# Patient Record
Sex: Male | Born: 1985 | Race: Black or African American | Hispanic: No | Marital: Single
Health system: Southern US, Community
[De-identification: ages and names within clinical notes are randomized; demographics above are authoritative.]

## PROBLEM LIST (undated history)

## (undated) DIAGNOSIS — B20 Human immunodeficiency virus [HIV] disease: Secondary | ICD-10-CM

## (undated) DIAGNOSIS — W3400XA Accidental discharge from unspecified firearms or gun, initial encounter: Secondary | ICD-10-CM

## (undated) DIAGNOSIS — F23 Brief psychotic disorder: Secondary | ICD-10-CM

## (undated) HISTORY — DX: Human immunodeficiency virus (HIV) disease: B20

---
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
## 2010-11-05 ENCOUNTER — Emergency Department (HOSPITAL_COMMUNITY)
Admission: EM | Admit: 2010-11-05 | Discharge: 2010-11-05 | Disposition: A | Discharge status: Home / Self Care | Payer: Self-pay | Attending: Emergency Medicine | Admitting: Emergency Medicine

## 2010-11-05 DIAGNOSIS — R142 Eructation: Secondary | ICD-10-CM | POA: Insufficient documentation

## 2010-11-05 DIAGNOSIS — R141 Gas pain: Secondary | ICD-10-CM | POA: Insufficient documentation

## 2010-11-05 DIAGNOSIS — H538 Other visual disturbances: Secondary | ICD-10-CM | POA: Insufficient documentation

## 2010-11-05 DIAGNOSIS — R110 Nausea: Secondary | ICD-10-CM | POA: Insufficient documentation

## 2010-11-05 LAB — URINE MICROSCOPIC-ADD ON

## 2010-11-05 LAB — RAPID URINE DRUG SCREEN, HOSP PERFORMED
Amphetamines: NOT DETECTED
Barbiturates: NOT DETECTED
Benzodiazepines: NOT DETECTED
Cocaine: POSITIVE — AB
Opiates: NOT DETECTED
Tetrahydrocannabinol: POSITIVE — AB

## 2010-11-05 LAB — POCT I-STAT, CHEM 8
BUN: 14 mg/dL (ref 6–23)
Calcium, Ion: 1.16 mmol/L (ref 1.12–1.32)
Chloride: 103 meq/L (ref 96–112)
Creatinine, Ser: 1.4 mg/dL (ref 0.4–1.5)
Glucose, Bld: 97 mg/dL (ref 70–99)
HCT: 45 % (ref 39.0–52.0)
Hemoglobin: 15.3 g/dL (ref 13.0–17.0)
Potassium: 4.2 meq/L (ref 3.5–5.1)
Sodium: 140 meq/L (ref 135–145)
TCO2: 25 mmol/L (ref 0–100)

## 2010-11-05 LAB — URINALYSIS, ROUTINE W REFLEX MICROSCOPIC
Hgb urine dipstick: NEGATIVE
Nitrite: NEGATIVE
Protein, ur: NEGATIVE mg/dL
Specific Gravity, Urine: 1.032 — ABNORMAL HIGH (ref 1.005–1.030)
Urobilinogen, UA: 4 mg/dL — ABNORMAL HIGH (ref 0.0–1.0)

## 2010-11-05 LAB — GLUCOSE, CAPILLARY: Glucose-Capillary: 113 mg/dL — ABNORMAL HIGH (ref 70–99)

## 2011-02-27 ENCOUNTER — Emergency Department (HOSPITAL_COMMUNITY)
Admission: EM | Admit: 2011-02-27 | Discharge: 2011-02-27 | Disposition: A | Discharge status: Home / Self Care | Payer: Self-pay | Attending: Emergency Medicine | Admitting: Emergency Medicine

## 2011-02-27 ENCOUNTER — Emergency Department (HOSPITAL_COMMUNITY): Payer: Self-pay

## 2011-02-27 DIAGNOSIS — M25579 Pain in unspecified ankle and joints of unspecified foot: Secondary | ICD-10-CM | POA: Insufficient documentation

## 2011-02-27 DIAGNOSIS — Y92009 Unspecified place in unspecified non-institutional (private) residence as the place of occurrence of the external cause: Secondary | ICD-10-CM | POA: Insufficient documentation

## 2011-02-27 DIAGNOSIS — W010XXA Fall on same level from slipping, tripping and stumbling without subsequent striking against object, initial encounter: Secondary | ICD-10-CM | POA: Insufficient documentation

## 2011-02-27 DIAGNOSIS — J45909 Unspecified asthma, uncomplicated: Secondary | ICD-10-CM | POA: Insufficient documentation

## 2011-02-27 DIAGNOSIS — S93609A Unspecified sprain of unspecified foot, initial encounter: Secondary | ICD-10-CM | POA: Insufficient documentation

## 2011-02-27 DIAGNOSIS — M7989 Other specified soft tissue disorders: Secondary | ICD-10-CM | POA: Insufficient documentation

## 2017-01-14 ENCOUNTER — Ambulatory Visit (INDEPENDENT_AMBULATORY_CARE_PROVIDER_SITE_OTHER): Payer: Self-pay | Admitting: Infectious Diseases

## 2017-01-14 ENCOUNTER — Encounter: Payer: Self-pay | Admitting: Infectious Diseases

## 2017-01-14 VITALS — BP 117/72 | HR 60 | Temp 97.9°F | Ht 71.0 in | Wt 174.0 lb

## 2017-01-14 DIAGNOSIS — Z7189 Other specified counseling: Secondary | ICD-10-CM

## 2017-01-14 DIAGNOSIS — Z7185 Encounter for immunization safety counseling: Secondary | ICD-10-CM

## 2017-01-14 DIAGNOSIS — Z113 Encounter for screening for infections with a predominantly sexual mode of transmission: Secondary | ICD-10-CM

## 2017-01-14 DIAGNOSIS — Z21 Asymptomatic human immunodeficiency virus [HIV] infection status: Secondary | ICD-10-CM

## 2017-01-14 DIAGNOSIS — B20 Human immunodeficiency virus [HIV] disease: Secondary | ICD-10-CM

## 2017-01-14 DIAGNOSIS — Z23 Encounter for immunization: Secondary | ICD-10-CM

## 2017-01-14 HISTORY — DX: Human immunodeficiency virus (HIV) disease: B20

## 2017-01-14 HISTORY — DX: Asymptomatic human immunodeficiency virus (hiv) infection status: Z21

## 2017-01-14 LAB — COMPLETE METABOLIC PANEL WITH GFR
ALBUMIN: 4.2 g/dL (ref 3.6–5.1)
ALK PHOS: 62 U/L (ref 40–115)
ALT: 18 U/L (ref 9–46)
AST: 18 U/L (ref 10–40)
BILIRUBIN TOTAL: 0.5 mg/dL (ref 0.2–1.2)
BUN: 8 mg/dL (ref 7–25)
CO2: 25 mmol/L (ref 20–31)
Calcium: 9.2 mg/dL (ref 8.6–10.3)
Chloride: 103 mmol/L (ref 98–110)
Creat: 0.96 mg/dL (ref 0.60–1.35)
GLUCOSE: 85 mg/dL (ref 65–99)
POTASSIUM: 4.1 mmol/L (ref 3.5–5.3)
SODIUM: 135 mmol/L (ref 135–146)
Total Protein: 7.7 g/dL (ref 6.1–8.1)

## 2017-01-14 LAB — CBC WITH DIFFERENTIAL/PLATELET
BASOS ABS: 42 {cells}/uL (ref 0–200)
Basophils Relative: 1 %
EOS ABS: 168 {cells}/uL (ref 15–500)
EOS PCT: 4 %
HCT: 40 % (ref 38.5–50.0)
HEMOGLOBIN: 13.8 g/dL (ref 13.2–17.1)
LYMPHS ABS: 1470 {cells}/uL (ref 850–3900)
Lymphocytes Relative: 35 %
MCH: 33.4 pg — AB (ref 27.0–33.0)
MCHC: 34.5 g/dL (ref 32.0–36.0)
MCV: 96.9 fL (ref 80.0–100.0)
MONOS PCT: 14 %
MPV: 9.7 fL (ref 7.5–12.5)
Monocytes Absolute: 588 cells/uL (ref 200–950)
NEUTROS PCT: 46 %
Neutro Abs: 1932 cells/uL (ref 1500–7800)
Platelets: 182 10*3/uL (ref 140–400)
RBC: 4.13 MIL/uL — ABNORMAL LOW (ref 4.20–5.80)
RDW: 12.5 % (ref 11.0–15.0)
WBC: 4.2 10*3/uL (ref 3.8–10.8)

## 2017-01-14 LAB — LIPID PANEL
Cholesterol: 152 mg/dL (ref <200)
HDL: 43 mg/dL (ref >40)
LDL CALC: 86 mg/dL (ref <100)
Total CHOL/HDL Ratio: 3.5 Ratio (ref <5.0)
Triglycerides: 117 mg/dL (ref <150)
VLDL: 23 mg/dL (ref <30)

## 2017-01-14 NOTE — Patient Instructions (Signed)
Nice to meet you today.   Please think about starting medications for your condition. It will be life-saving!  Come back in 2 months so we can talk about options again, see how you are feeling and review your lab work.

## 2017-01-14 NOTE — Assessment & Plan Note (Addendum)
New patient here to establish for HIV care. I discussed with Jason Herman treatment options/side effects, benefits of treatment and long-term outcomes. I discussed how HIV is transmitted and the process of untreated HIV including increased risk for opportunistic infections, cancer, dementia and renal failure. He was counseled on routine HIV care including medication adherence, blood monitoring, necessary vaccines and follow up visits. Counseled regarding safe sex practices including: condom use, partner disclosure, limiting partners. He spent time talking with our pharmacist Cassie regarding successful practices of ART and understands to reach out to our clinic in the future with questions.   He is not interested in starting medications at this time. It seems that he believes in part that his condition was 'wished' upon him and for reasons similar to these beliefs he does not like to take medications (spoke to me about how people willed his mother seizures). I will check baseline blood work today including CBC w/ Diff, CMET, lipids, HIV VL with genotype reflex, CD4 count, HLA-B*701, Hep A Ab, HepB sAg, HepB sAb, HepC Ab, quantiferon gold, urinalysis, urine GC/C. I recommended he follow up in 2 months (when he is released - per his preference) so we can review lab work and further discuss treatment.

## 2017-01-14 NOTE — Assessment & Plan Note (Signed)
Administered Menveo and Pneumovax today in clinic. Will assess serology for hep A/B immune status.
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
## 2017-01-15 LAB — URINALYSIS
Bilirubin Urine: NEGATIVE
Glucose, UA: NEGATIVE
Hgb urine dipstick: NEGATIVE
Ketones, ur: NEGATIVE
LEUKOCYTES UA: NEGATIVE
NITRITE: NEGATIVE
Protein, ur: NEGATIVE
SPECIFIC GRAVITY, URINE: 1.009 (ref 1.001–1.035)
pH: 7 (ref 5.0–8.0)

## 2017-01-15 LAB — HEPATITIS B SURFACE ANTIBODY,QUALITATIVE: HEP B S AB: NONREACTIVE

## 2017-01-15 LAB — T-HELPER CELL (CD4) - (RCID CLINIC ONLY)
CD4 % Helper T Cell: 30 % — ABNORMAL LOW (ref 33–55)
CD4 T Cell Abs: 460 /uL (ref 400–2700)

## 2017-01-15 LAB — HEPATITIS C ANTIBODY: HCV Ab: NEGATIVE

## 2017-01-15 LAB — URINE CYTOLOGY ANCILLARY ONLY
Chlamydia: NEGATIVE
Neisseria Gonorrhea: NEGATIVE

## 2017-01-15 LAB — HEPATITIS A ANTIBODY, TOTAL: HEP A TOTAL AB: NONREACTIVE

## 2017-01-15 LAB — HEPATITIS B SURFACE ANTIGEN: HEP B S AG: NEGATIVE

## 2017-01-15 LAB — RPR

## 2017-01-15 NOTE — Progress Notes (Signed)
No OI proph indicated. CD4 460.

## 2017-01-16 LAB — QUANTIFERON TB GOLD ASSAY (BLOOD)
Interferon Gamma Release Assay: NEGATIVE
Mitogen-Nil: 9.51 IU/mL
Quantiferon Nil Value: 0.2 IU/mL
Quantiferon Tb Ag Minus Nil Value: <0 IU/mL

## 2017-01-17 ENCOUNTER — Encounter: Payer: Self-pay | Admitting: Infectious Diseases

## 2017-01-18 ENCOUNTER — Encounter: Payer: Self-pay | Admitting: Licensed Clinical Social Worker

## 2017-01-23 LAB — HIV-1 RNA,QN PCR W/REFLEX GENOTYPE
HIV-1 RNA, QN PCR: 5.21 {Log_copies}/mL — AB
HIV-1 RNA, QN PCR: 161000 Copies/mL — ABNORMAL HIGH

## 2017-01-23 LAB — HLA B*5701: HLA-B*5701 w/rflx HLA-B High: NEGATIVE

## 2017-03-14 ENCOUNTER — Ambulatory Visit: Payer: Self-pay | Admitting: Infectious Diseases

## 2020-03-23 ENCOUNTER — Emergency Department (HOSPITAL_COMMUNITY)
Admission: EM | Admit: 2020-03-23 | Discharge: 2020-03-24 | Disposition: A | Discharge status: Home / Self Care | Payer: Self-pay | Attending: Emergency Medicine | Admitting: Emergency Medicine

## 2020-03-23 ENCOUNTER — Emergency Department (HOSPITAL_COMMUNITY): Payer: Self-pay

## 2020-03-23 DIAGNOSIS — W3400XA Accidental discharge from unspecified firearms or gun, initial encounter: Secondary | ICD-10-CM | POA: Insufficient documentation

## 2020-03-23 DIAGNOSIS — S41001A Unspecified open wound of right shoulder, initial encounter: Secondary | ICD-10-CM | POA: Insufficient documentation

## 2020-03-23 DIAGNOSIS — R000 Tachycardia, unspecified: Secondary | ICD-10-CM | POA: Insufficient documentation

## 2020-03-23 DIAGNOSIS — S21401A Unspecified open wound of right back wall of thorax with penetration into thoracic cavity, initial encounter: Secondary | ICD-10-CM | POA: Insufficient documentation

## 2020-03-23 DIAGNOSIS — T1490XA Injury, unspecified, initial encounter: Secondary | ICD-10-CM

## 2020-03-23 LAB — COMPREHENSIVE METABOLIC PANEL
ALT: 12 U/L (ref 0–44)
AST: 24 U/L (ref 15–41)
Albumin: 3.2 g/dL — ABNORMAL LOW (ref 3.5–5.0)
Alkaline Phosphatase: 63 U/L (ref 38–126)
Anion gap: 12 (ref 5–15)
BUN: 8 mg/dL (ref 6–20)
CO2: 20 mmol/L — ABNORMAL LOW (ref 22–32)
Calcium: 8.6 mg/dL — ABNORMAL LOW (ref 8.9–10.3)
Chloride: 103 mmol/L (ref 98–111)
Creatinine, Ser: 1.49 mg/dL — ABNORMAL HIGH (ref 0.61–1.24)
GFR calc non Af Amer: 60 mL/min — ABNORMAL LOW (ref >60)
Glucose, Bld: 138 mg/dL — ABNORMAL HIGH (ref 70–99)
Potassium: 3.8 mmol/L (ref 3.5–5.1)
Sodium: 135 mmol/L (ref 135–145)
Total Bilirubin: 0.1 mg/dL — ABNORMAL LOW (ref 0.3–1.2)
Total Protein: 6.8 g/dL (ref 6.5–8.1)

## 2020-03-23 LAB — I-STAT CHEM 8, ED
BUN: 7 mg/dL (ref 6–20)
Calcium, Ion: 1.11 mmol/L — ABNORMAL LOW (ref 1.15–1.40)
Chloride: 101 mmol/L (ref 98–111)
Creatinine, Ser: 1.4 mg/dL — ABNORMAL HIGH (ref 0.61–1.24)
Glucose, Bld: 136 mg/dL — ABNORMAL HIGH (ref 70–99)
HCT: 35 % — ABNORMAL LOW (ref 39.0–52.0)
Hemoglobin: 11.9 g/dL — ABNORMAL LOW (ref 13.0–17.0)
Potassium: 3.6 mmol/L (ref 3.5–5.1)
Sodium: 137 mmol/L (ref 135–145)
TCO2: 22 mmol/L (ref 22–32)

## 2020-03-23 LAB — CBC
HCT: 37.7 % — ABNORMAL LOW (ref 39.0–52.0)
Hemoglobin: 12.6 g/dL — ABNORMAL LOW (ref 13.0–17.0)
MCH: 34.6 pg — ABNORMAL HIGH (ref 26.0–34.0)
MCHC: 33.4 g/dL (ref 30.0–36.0)
MCV: 103.6 fL — ABNORMAL HIGH (ref 80.0–100.0)
Platelets: 132 10*3/uL — ABNORMAL LOW (ref 150–400)
RBC: 3.64 MIL/uL — ABNORMAL LOW (ref 4.22–5.81)
RDW: 12.5 % (ref 11.5–15.5)
WBC: 4.6 10*3/uL (ref 4.0–10.5)
nRBC: 0 % (ref 0.0–0.2)

## 2020-03-23 LAB — SAMPLE TO BLOOD BANK

## 2020-03-23 LAB — ETHANOL: Alcohol, Ethyl (B): <10 mg/dL (ref <10)

## 2020-03-23 LAB — PROTIME-INR
INR: 1 (ref 0.8–1.2)
Prothrombin Time: 12.6 seconds (ref 11.4–15.2)

## 2020-03-23 LAB — LACTIC ACID, PLASMA: Lactic Acid, Venous: 3.6 mmol/L (ref 0.5–1.9)

## 2020-03-23 MED ORDER — FENTANYL CITRATE (PF) 100 MCG/2ML IJ SOLN
INTRAMUSCULAR | Status: DC
Start: 2020-03-23 — End: 2020-03-24
  Filled 2020-03-23: qty 2

## 2020-03-23 MED ORDER — FENTANYL CITRATE (PF) 100 MCG/2ML IJ SOLN
INTRAMUSCULAR | Status: AC | PRN
  Administered 2020-03-23: 100 ug via INTRAVENOUS

## 2020-03-23 MED ORDER — IOHEXOL 300 MG/ML  SOLN
75.0000 mL | Freq: Once | INTRAMUSCULAR | Status: AC | PRN
  Administered 2020-03-23: 75 mL via INTRAVENOUS
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
## 2020-03-24 MED ORDER — SENNOSIDES-DOCUSATE SODIUM 8.6-50 MG PO TABS: 1.0000 | ORAL_TABLET | Freq: Every evening | ORAL | 0 refills | Status: DC | PRN

## 2020-03-24 MED ORDER — OXYCODONE-ACETAMINOPHEN 5-325 MG PO TABS: 1.0000 | ORAL_TABLET | Freq: Four times a day (QID) | ORAL | 0 refills | Status: DC | PRN

## 2020-03-24 MED ORDER — IBUPROFEN 800 MG PO TABS: 800.0000 mg | ORAL_TABLET | Freq: Three times a day (TID) | ORAL | 0 refills | Status: DC | PRN
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
## 2020-03-24 NOTE — ED Notes (Signed)
Pt ambulatory to bathroom with standby assist.

## 2020-03-31 ENCOUNTER — Ambulatory Visit: Payer: Self-pay | Admitting: Orthopaedic Surgery

## 2020-10-31 ENCOUNTER — Emergency Department (HOSPITAL_COMMUNITY)
Admission: EM | Admit: 2020-10-31 | Discharge: 2020-10-31 | Disposition: A | Discharge status: Home / Self Care | Payer: Self-pay | Attending: Emergency Medicine | Admitting: Emergency Medicine

## 2020-10-31 ENCOUNTER — Encounter (HOSPITAL_COMMUNITY): Payer: Self-pay

## 2020-10-31 DIAGNOSIS — Z21 Asymptomatic human immunodeficiency virus [HIV] infection status: Secondary | ICD-10-CM | POA: Insufficient documentation

## 2020-10-31 DIAGNOSIS — B029 Zoster without complications: Secondary | ICD-10-CM | POA: Insufficient documentation

## 2020-10-31 HISTORY — DX: Accidental discharge from unspecified firearms or gun, initial encounter: W34.00XA

## 2020-10-31 MED ORDER — ACYCLOVIR 400 MG PO TABS
400.0000 mg | ORAL_TABLET | Freq: Four times a day (QID) | ORAL | 0 refills | Status: DC
Start: 2020-10-31 — End: 2020-11-29

## 2020-10-31 NOTE — Discharge Instructions (Signed)
Return for any problem.  ?

## 2020-10-31 NOTE — ED Provider Notes (Signed)
Emergency Medicine Provider Triage Evaluation Note  Jason Herman , a 35 y.o. male  was evaluated in triage.  Pt complains of rash x 3 days. Some itching prior to rash developing, this has now scabbed.  Review of Systems  Positive: rash Negative: Fever, vision changes  Physical Exam  BP 111/82 (BP Location: Right Arm)   Pulse 92   Temp 99.8 F (37.7 C) (Oral)   Resp 16   Ht 6' (1.829 m)   Wt 79.4 kg   SpO2 100%   BMI 23.73 kg/m  Gen:   Awake, no distress   Resp:  Normal effort  MSK:   Moves extremities without difficulty  Other:    Medical Decision Making  Medically screening exam initiated at 9:09 PM.  Appropriate orders placed.  Jason Herman was informed that the remainder of the evaluation will be completed by another provider, this initial triage assessment does not replace that evaluation, and the importance of remaining in the ED until their evaluation is complete.  Patient here with rash x 3 days, does not cross the midline, scabbed over with some open sores. Concern for shingles.    Claude Manges, PA-C 10/31/20 2111    Wynetta Fines, MD 11/01/20 1501

## 2020-10-31 NOTE — ED Triage Notes (Signed)
Pt has rash on L flank area, appears to be scabbed over abrasion, or shingles, denies pain
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
## 2020-11-28 ENCOUNTER — Other Ambulatory Visit: Payer: Self-pay

## 2020-11-28 ENCOUNTER — Encounter (HOSPITAL_COMMUNITY): Payer: Self-pay | Admitting: *Deleted

## 2020-11-28 ENCOUNTER — Emergency Department (HOSPITAL_COMMUNITY): Payer: Self-pay

## 2020-11-28 ENCOUNTER — Emergency Department (HOSPITAL_COMMUNITY)
Admission: EM | Admit: 2020-11-28 | Discharge: 2020-11-29 | Disposition: A | Discharge status: Home / Self Care | Payer: Self-pay | Attending: Emergency Medicine | Admitting: Emergency Medicine

## 2020-11-28 DIAGNOSIS — B028 Zoster with other complications: Secondary | ICD-10-CM | POA: Insufficient documentation

## 2020-11-28 DIAGNOSIS — Z21 Asymptomatic human immunodeficiency virus [HIV] infection status: Secondary | ICD-10-CM | POA: Insufficient documentation

## 2020-11-28 LAB — CBC WITH DIFFERENTIAL/PLATELET
Abs Immature Granulocytes: 0.01 10*3/uL (ref 0.00–0.07)
Basophils Absolute: 0 10*3/uL (ref 0.0–0.1)
Basophils Relative: 1 %
Eosinophils Absolute: 0.6 10*3/uL — ABNORMAL HIGH (ref 0.0–0.5)
Eosinophils Relative: 21 %
HCT: 37.2 % — ABNORMAL LOW (ref 39.0–52.0)
Hemoglobin: 12.5 g/dL — ABNORMAL LOW (ref 13.0–17.0)
Immature Granulocytes: 0 %
Lymphocytes Relative: 31 %
Lymphs Abs: 0.8 10*3/uL (ref 0.7–4.0)
MCH: 32.6 pg (ref 26.0–34.0)
MCHC: 33.6 g/dL (ref 30.0–36.0)
MCV: 97.1 fL (ref 80.0–100.0)
Monocytes Absolute: 0.2 10*3/uL (ref 0.1–1.0)
Monocytes Relative: 8 %
Neutro Abs: 1 10*3/uL — ABNORMAL LOW (ref 1.7–7.7)
Neutrophils Relative %: 39 %
Platelets: 186 10*3/uL (ref 150–400)
RBC: 3.83 MIL/uL — ABNORMAL LOW (ref 4.22–5.81)
RDW: 12.3 % (ref 11.5–15.5)
WBC: 2.6 10*3/uL — ABNORMAL LOW (ref 4.0–10.5)
nRBC: 0 % (ref 0.0–0.2)

## 2020-11-28 LAB — TROPONIN I (HIGH SENSITIVITY)
Troponin I (High Sensitivity): 2 ng/L (ref <18)
Troponin I (High Sensitivity): 3 ng/L (ref <18)

## 2020-11-28 LAB — BASIC METABOLIC PANEL
Anion gap: 10 (ref 5–15)
BUN: 6 mg/dL (ref 6–20)
CO2: 21 mmol/L — ABNORMAL LOW (ref 22–32)
Calcium: 8.8 mg/dL — ABNORMAL LOW (ref 8.9–10.3)
Chloride: 103 mmol/L (ref 98–111)
Creatinine, Ser: 1.04 mg/dL (ref 0.61–1.24)
GFR, Estimated: >60 mL/min (ref >60)
Glucose, Bld: 96 mg/dL (ref 70–99)
Potassium: 4.4 mmol/L (ref 3.5–5.1)
Sodium: 134 mmol/L — ABNORMAL LOW (ref 135–145)

## 2020-11-28 LAB — TSH: TSH: 0.419 u[IU]/mL (ref 0.350–4.500)

## 2020-11-28 LAB — MAGNESIUM: Magnesium: 2.3 mg/dL (ref 1.7–2.4)

## 2020-11-28 NOTE — ED Triage Notes (Signed)
Pt was here 5/16 for same but reports rash has gotten worse and spreading. Rash noted to chest, abd, arms. No acute distress is noted at triage.

## 2020-11-28 NOTE — ED Provider Notes (Signed)
Emergency Medicine Provider Triage Evaluation Note  Jason Herman , a 35 y.o. male  was evaluated in triage.  Pt complains of presents with concern for lightheadedness and left chest pain with palpitations that started approximately 3 days ago but was worse yesterday and today.  Additionally he has rash on the hands left abdomen wrapping around to his left back which was diagnosed last month as herpes zoster.  Patient not pick up acyclovir prescription, instead treated with oral antibiotic ointment.  Is not having burning pain in the distribution of his prior rash surrounding his back.  Review of Systems  Positive: Rash, lightheadedness, chest pain, palpitations Negative: Shortness of breath, nausea, vomiting, abdominal pain, fevers  Physical Exam  BP 123/75 (BP Location: Right Arm)   Pulse (!) 102   Temp 98 F (36.7 C) (Oral)   Resp 16   SpO2 100%  Gen:   Awake, no distress   Resp:  Normal effort  MSK:   Moves extremities without difficulty  Other:  Scarring on left abdomen and back in herpetic distribution. RRR no m/g/r  Medical Decision Making  Medically screening exam initiated at 8:24 PM.  Appropriate orders placed.  Jason Herman was informed that the remainder of the evaluation will be completed by another provider, this initial triage assessment does not replace that evaluation, and the importance of remaining in the ED until their evaluation is complete.  This chart was dictated using voice recognition software, Dragon. Despite the best efforts of this provider to proofread and correct errors, errors may still occur which can change documentation meaning.    Sherrilee Gilles 11/28/20 2038    Koleen Distance, MD 11/28/20 2043

## 2020-11-29 LAB — T-HELPER CELLS (CD4) COUNT (NOT AT ARMC)
CD4 % Helper T Cell: 22 % — ABNORMAL LOW (ref 33–65)
CD4 T Cell Abs: 135 /uL — ABNORMAL LOW (ref 400–1790)

## 2020-11-29 MED ORDER — ACYCLOVIR 800 MG PO TABS: 800.0000 mg | ORAL_TABLET | Freq: Every day | ORAL | 0 refills | Status: AC

## 2020-11-29 MED ORDER — NAPROXEN 500 MG PO TABS: 500.0000 mg | ORAL_TABLET | Freq: Two times a day (BID) | ORAL | 0 refills | Status: AC
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
## 2020-11-29 NOTE — Discharge Instructions (Addendum)
It is extremely important that you take the acyclovir. Take naproxen 2 times a day with meals.  Do not take other anti-inflammatories at the same time (Advil, Motrin, ibuprofen, Aleve). You may supplement with Tylenol if you need further pain control.  Follow-up with the doctor at the clinic listed below for further evaluation of your rash.  Return to the emergency room if you develop high fevers, difficulty breathing, chest pain, or any new, worsening, concerning symptoms.

## 2021-04-04 ENCOUNTER — Encounter (HOSPITAL_COMMUNITY): Payer: Self-pay

## 2021-04-04 ENCOUNTER — Emergency Department (HOSPITAL_COMMUNITY)
Admission: EM | Admit: 2021-04-04 | Discharge: 2021-04-04 | Disposition: A | Discharge status: Home / Self Care | Payer: Self-pay | Attending: Emergency Medicine | Admitting: Emergency Medicine

## 2021-04-04 ENCOUNTER — Other Ambulatory Visit: Payer: Self-pay

## 2021-04-04 ENCOUNTER — Emergency Department (HOSPITAL_COMMUNITY): Payer: Self-pay

## 2021-04-04 DIAGNOSIS — F17210 Nicotine dependence, cigarettes, uncomplicated: Secondary | ICD-10-CM | POA: Insufficient documentation

## 2021-04-04 DIAGNOSIS — U071 COVID-19: Secondary | ICD-10-CM | POA: Insufficient documentation

## 2021-04-04 DIAGNOSIS — B20 Human immunodeficiency virus [HIV] disease: Secondary | ICD-10-CM

## 2021-04-04 DIAGNOSIS — H9209 Otalgia, unspecified ear: Secondary | ICD-10-CM | POA: Insufficient documentation

## 2021-04-04 DIAGNOSIS — Z21 Asymptomatic human immunodeficiency virus [HIV] infection status: Secondary | ICD-10-CM | POA: Insufficient documentation

## 2021-04-04 DIAGNOSIS — R000 Tachycardia, unspecified: Secondary | ICD-10-CM | POA: Insufficient documentation

## 2021-04-04 DIAGNOSIS — R0981 Nasal congestion: Secondary | ICD-10-CM | POA: Insufficient documentation

## 2021-04-04 LAB — CBC WITH DIFFERENTIAL/PLATELET
Abs Immature Granulocytes: 0.05 10*3/uL (ref 0.00–0.07)
Basophils Absolute: 0 10*3/uL (ref 0.0–0.1)
Basophils Relative: 1 %
Eosinophils Absolute: 0.1 10*3/uL (ref 0.0–0.5)
Eosinophils Relative: 2 %
HCT: 42.6 % (ref 39.0–52.0)
Hemoglobin: 14.7 g/dL (ref 13.0–17.0)
Immature Granulocytes: 2 %
Lymphocytes Relative: 26 %
Lymphs Abs: 0.8 10*3/uL (ref 0.7–4.0)
MCH: 32.6 pg (ref 26.0–34.0)
MCHC: 34.5 g/dL (ref 30.0–36.0)
MCV: 94.5 fL (ref 80.0–100.0)
Monocytes Absolute: 0.4 10*3/uL (ref 0.1–1.0)
Monocytes Relative: 12 %
Neutro Abs: 1.9 10*3/uL (ref 1.7–7.7)
Neutrophils Relative %: 57 %
Platelets: 181 10*3/uL (ref 150–400)
RBC: 4.51 MIL/uL (ref 4.22–5.81)
RDW: 12.5 % (ref 11.5–15.5)
WBC: 3.2 10*3/uL — ABNORMAL LOW (ref 4.0–10.5)
nRBC: 0 % (ref 0.0–0.2)

## 2021-04-04 LAB — RESP PANEL BY RT-PCR (FLU A&B, COVID) ARPGX2
Influenza A by PCR: NEGATIVE
Influenza B by PCR: NEGATIVE
SARS Coronavirus 2 by RT PCR: POSITIVE — AB

## 2021-04-04 LAB — BASIC METABOLIC PANEL
Anion gap: 10 (ref 5–15)
BUN: 15 mg/dL (ref 6–20)
CO2: 22 mmol/L (ref 22–32)
Calcium: 9.5 mg/dL (ref 8.9–10.3)
Chloride: 103 mmol/L (ref 98–111)
Creatinine, Ser: 0.94 mg/dL (ref 0.61–1.24)
GFR, Estimated: >60 mL/min (ref >60)
Glucose, Bld: 113 mg/dL — ABNORMAL HIGH (ref 70–99)
Potassium: 3.7 mmol/L (ref 3.5–5.1)
Sodium: 135 mmol/L (ref 135–145)

## 2021-04-04 MED ORDER — ACETAMINOPHEN 325 MG PO TABS
650.0000 mg | ORAL_TABLET | Freq: Once | ORAL | Status: AC
  Administered 2021-04-04: 650 mg via ORAL
  Filled 2021-04-04: qty 2

## 2021-04-04 NOTE — ED Triage Notes (Signed)
Per EMS-cold symptoms for about 1 week-sore throat, fatigue, congestion-has not taken any meds

## 2021-04-04 NOTE — ED Provider Notes (Signed)
Emergency Medicine Provider Triage Evaluation Note  Jason Herman , a 35 y.o. male  was evaluated in triage.  Pt complains of a 2-week history of sore throat that has been constant and worsening.  He also reports associated nausea and vomiting.  He is having trouble eating secondary to pain.  He rates his sore throat moderate in severity.  He reports associated chest pain with coughing and vomiting.  He denies any diarrhea or abdominal pain.  Review of Systems  Positive:  Negative: See above   Physical Exam  BP (!) 126/100 (BP Location: Left Arm)   Pulse (!) 105   Temp 98.2 F (36.8 C) (Oral)   Resp 14   Ht 6' (1.829 m)   Wt 81.6 kg   SpO2 100%   BMI 24.41 kg/m  Gen:   Awake, no distress   Resp:  Normal effort  MSK:   Moves extremities without difficulty  Other:  No pharyngeal erythema or exudate.  Tonsils are normal.  Uvula is midline.  No airway compromise at this time.  Medical Decision Making  Medically screening exam initiated at 2:16 PM.  Appropriate orders placed.  PENIEL HASS was informed that the remainder of the evaluation will be completed by another provider, this initial triage assessment does not replace that evaluation, and the importance of remaining in the ED until their evaluation is complete.     Honor Loh [place], PA-C 04/04/21 1417    Pollyann Savoy, MD 04/04/21 [phone]

## 2021-04-04 NOTE — ED Triage Notes (Signed)
Patient also c/o bilateral earache.
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
## 2021-04-04 NOTE — Discharge Instructions (Signed)
You were seen in the emergency department for cough sore throat earache and other symptoms.  You tested positive for COVID.  The rest of your lab work was fairly unremarkable.  Please drink plenty of fluids, use Tylenol and ibuprofen for pain or fever.

## 2021-04-04 NOTE — ED Notes (Signed)
Patient refused lab draw. Triage RN made aware. 

## 2021-04-18 ENCOUNTER — Inpatient Hospital Stay: Payer: Self-pay | Admitting: Internal Medicine
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
## 2021-10-02 ENCOUNTER — Emergency Department (HOSPITAL_COMMUNITY): Payer: Self-pay

## 2021-10-02 ENCOUNTER — Other Ambulatory Visit: Payer: Self-pay

## 2021-10-02 ENCOUNTER — Encounter (HOSPITAL_COMMUNITY): Payer: Self-pay | Admitting: Emergency Medicine

## 2021-10-02 ENCOUNTER — Emergency Department (HOSPITAL_COMMUNITY)
Admission: EM | Admit: 2021-10-02 | Discharge: 2021-10-02 | Disposition: A | Discharge status: Home / Self Care | Payer: Self-pay | Attending: Emergency Medicine | Admitting: Emergency Medicine

## 2021-10-02 DIAGNOSIS — R0602 Shortness of breath: Secondary | ICD-10-CM | POA: Insufficient documentation

## 2021-10-02 DIAGNOSIS — D638 Anemia in other chronic diseases classified elsewhere: Secondary | ICD-10-CM

## 2021-10-02 DIAGNOSIS — B370 Candidal stomatitis: Secondary | ICD-10-CM | POA: Insufficient documentation

## 2021-10-02 DIAGNOSIS — D649 Anemia, unspecified: Secondary | ICD-10-CM | POA: Insufficient documentation

## 2021-10-02 DIAGNOSIS — R059 Cough, unspecified: Secondary | ICD-10-CM | POA: Insufficient documentation

## 2021-10-02 DIAGNOSIS — Z20822 Contact with and (suspected) exposure to covid-19: Secondary | ICD-10-CM | POA: Insufficient documentation

## 2021-10-02 LAB — CBC WITH DIFFERENTIAL/PLATELET
Abs Immature Granulocytes: 0.05 10*3/uL (ref 0.00–0.07)
Basophils Absolute: 0 10*3/uL (ref 0.0–0.1)
Basophils Relative: 0 %
Eosinophils Absolute: 0 10*3/uL (ref 0.0–0.5)
Eosinophils Relative: 1 %
HCT: 29.3 % — ABNORMAL LOW (ref 39.0–52.0)
Hemoglobin: 9.7 g/dL — ABNORMAL LOW (ref 13.0–17.0)
Immature Granulocytes: 2 %
Lymphocytes Relative: 12 %
Lymphs Abs: 0.3 10*3/uL — ABNORMAL LOW (ref 0.7–4.0)
MCH: 32.1 pg (ref 26.0–34.0)
MCHC: 33.1 g/dL (ref 30.0–36.0)
MCV: 97 fL (ref 80.0–100.0)
Monocytes Absolute: 0.4 10*3/uL (ref 0.1–1.0)
Monocytes Relative: 14 %
Neutro Abs: 1.9 10*3/uL (ref 1.7–7.7)
Neutrophils Relative %: 71 %
Platelets: 104 10*3/uL — ABNORMAL LOW (ref 150–400)
RBC: 3.02 MIL/uL — ABNORMAL LOW (ref 4.22–5.81)
RDW: 13 % (ref 11.5–15.5)
WBC: 2.7 10*3/uL — ABNORMAL LOW (ref 4.0–10.5)
nRBC: 0.7 % — ABNORMAL HIGH (ref 0.0–0.2)

## 2021-10-02 LAB — PROTIME-INR
INR: 1.1 (ref 0.8–1.2)
Prothrombin Time: 13.6 seconds (ref 11.4–15.2)

## 2021-10-02 LAB — RESP PANEL BY RT-PCR (FLU A&B, COVID) ARPGX2
Influenza A by PCR: NEGATIVE
Influenza B by PCR: NEGATIVE
SARS Coronavirus 2 by RT PCR: NEGATIVE

## 2021-10-02 LAB — COMPREHENSIVE METABOLIC PANEL
ALT: 29 U/L (ref 0–44)
AST: 61 U/L — ABNORMAL HIGH (ref 15–41)
Albumin: 3.3 g/dL — ABNORMAL LOW (ref 3.5–5.0)
Alkaline Phosphatase: 71 U/L (ref 38–126)
Anion gap: 10 (ref 5–15)
BUN: 7 mg/dL (ref 6–20)
CO2: 21 mmol/L — ABNORMAL LOW (ref 22–32)
Calcium: 8.6 mg/dL — ABNORMAL LOW (ref 8.9–10.3)
Chloride: 104 mmol/L (ref 98–111)
Creatinine, Ser: 0.99 mg/dL (ref 0.61–1.24)
GFR, Estimated: >60 mL/min (ref >60)
Glucose, Bld: 106 mg/dL — ABNORMAL HIGH (ref 70–99)
Potassium: 3.2 mmol/L — ABNORMAL LOW (ref 3.5–5.1)
Sodium: 135 mmol/L (ref 135–145)
Total Bilirubin: 0.4 mg/dL (ref 0.3–1.2)
Total Protein: 7.1 g/dL (ref 6.5–8.1)

## 2021-10-02 LAB — APTT: aPTT: 40 seconds — ABNORMAL HIGH (ref 24–36)

## 2021-10-02 LAB — LACTIC ACID, PLASMA: Lactic Acid, Venous: 1.1 mmol/L (ref 0.5–1.9)

## 2021-10-02 MED ORDER — ACETAMINOPHEN 325 MG PO TABS
650.0000 mg | ORAL_TABLET | Freq: Once | ORAL | Status: AC
  Administered 2021-10-02: 650 mg via ORAL
  Filled 2021-10-02: qty 2

## 2021-10-02 MED ORDER — NYSTATIN 100000 UNIT/ML MT SUSP
5.0000 mL | Freq: Four times a day (QID) | OROMUCOSAL | Status: DC
  Administered 2021-10-02: 500000 [IU] via ORAL
  Filled 2021-10-02: qty 5

## 2021-10-02 MED ORDER — BIKTARVY 50-200-25 MG PO TABS: 1.0000 | ORAL_TABLET | Freq: Every day | ORAL | 0 refills | Status: DC

## 2021-10-02 MED ORDER — NYSTATIN 100000 UNIT/ML MT SUSP: 500000.0000 [IU] | Freq: Four times a day (QID) | OROMUCOSAL | 0 refills | Status: AC

## 2021-10-02 NOTE — Discharge Instructions (Signed)
Please follow-up in the infectious disease clinic.  They will see you without insurance.  Please take medications as prescribed.   ?

## 2021-10-02 NOTE — ED Notes (Signed)
Discharge instructions reviewed with patient. Patient verbalized understanding of instructions. Follow-up care and medications were reviewed. Patient ambulatory with steady gait. VSS upon discharge.  ?

## 2021-10-02 NOTE — ED Provider Triage Note (Signed)
Emergency Medicine Provider Triage Evaluation Note ? ?Jason Herman , a 36 y.o. male  was evaluated in triage.  Pt complains of gradual onset, constant, worsening, SOB that began 1 month ago. Pt also complains of dry cough. He was unaware he had a fever until he got here. Denies recent sick contacts. Hx of HIV, not on any medication for same. Most recent CD 4 count 11/29/20 22%.  ? ?Review of Systems  ?Positive: + cough, SOB, fever ?Negative: - chest pain ? ?Physical Exam  ?BP 108/78 (BP Location: Right Arm)   Pulse (!) 110   Temp 100.3 ?F (37.9 ?C) (Oral)   Resp 16   SpO2 98%  ?Gen:   Awake, no distress   ?Resp:  Normal effort  ?MSK:   Moves extremities without difficulty  ?Other:   ? ?Medical Decision Making  ?Medically screening exam initiated at 4:40 PM.  Appropriate orders placed.  Jason Herman was informed that the remainder of the evaluation will be completed by another provider, this initial triage assessment does not replace that evaluation, and the importance of remaining in the ED until their evaluation is complete. ? ? ?  ?Jason Maize, PA-C ?10/02/21 1641 ? ?

## 2021-10-02 NOTE — ED Provider Notes (Signed)
?[facility]-EMERGENCY DEPT ?[facility] Emergency Department ?Provider Note ?MRN:  [id number]  ?Arrival date & time: 10/02/21    ? ?Chief Complaint   ?Shortness of Breath and Cough ?  ?History of Present Illness   ?Jason Herman is a 36 y.o. year-old male presents to the ED with chief complaint of generalized fatigue.  Patient also complains of sore throat.  He states that his symptoms have been going on for a while, but is not able to specify.  He does have history of HIV, but is not on PreP or seeing ID.  He denies fever or pain other than in his throat.  ? ?History provided by patient. ? ? ?Review of Systems  ?Pertinent review of systems noted in HPI.  ? ? ?Physical Exam  ? ?Vitals:  ? 10/02/21 2227 10/02/21 2245  ?BP: 103/65 103/74  ?Pulse: 85   ?Resp: 14   ?Temp:    ?SpO2: 100%   ?  ?CONSTITUTIONAL:  nontoxic-appearing, NAD ?NEURO:  Alert and oriented x 3, CN 3-12 grossly intact ?EYES:  eyes equal and reactive ?ENT/NECK:  Supple, no stridor, erythematous oropharynx and tongue that appears consistent with oropharyngeal candidiasis ?CARDIO:  normal rate, normal regular rhythm, appears well-perfused  ?PULM:  No respiratory distress, CTAB ?GI/GU:  non-distended, no focal tenderness ?MSK/SPINE:  No gross deformities, no edema, moves all extremities  ?SKIN:  no rash, atraumatic ? ? ?*Additional and/or pertinent findings included in MDM below ? ?Diagnostic and Interventional Summary  ? ? EKG Interpretation ? ?Date/Time:    ?Ventricular Rate:    ?PR Interval:    ?QRS Duration:   ?QT Interval:    ?QTC Calculation:   ?R Axis:     ?Text Interpretation:   ?  ? ?  ? ?Labs Reviewed  ?COMPREHENSIVE METABOLIC PANEL - Abnormal; Notable for the following components:  ?    Result Value  ? Potassium 3.2 (*)   ? CO2 21 (*)   ? Glucose, Bld 106 (*)   ? Calcium 8.6 (*)   ? Albumin 3.3 (*)   ? AST 61 (*)   ? All other components within normal limits  ?CBC WITH DIFFERENTIAL/PLATELET - Abnormal; Notable for the following components:  ?  WBC 2.7 (*)   ? RBC 3.02 (*)   ? Hemoglobin 9.7 (*)   ? HCT 29.3 (*)   ? Platelets 104 (*)   ? nRBC 0.7 (*)   ? Lymphs Abs 0.3 (*)   ? All other components within normal limits  ?APTT - Abnormal; Notable for the following components:  ? aPTT 40 (*)   ? All other components within normal limits  ?RESP PANEL BY RT-PCR (FLU A&B, COVID) ARPGX2  ?CULTURE, BLOOD (ROUTINE X 2)  ?CULTURE, BLOOD (ROUTINE X 2)  ?URINE CULTURE  ?LACTIC ACID, PLASMA  ?PROTIME-INR  ?LACTIC ACID, PLASMA  ?URINALYSIS, ROUTINE W REFLEX MICROSCOPIC  ?  ?DG Chest 1 View  ?Final Result  ?  ?  ?Medications  ?nystatin (MYCOSTATIN) 100000 UNIT/ML suspension 500,000 Units (has no administration in time range)  ?acetaminophen (TYLENOL) tablet 650 mg (650 mg Oral Given 10/02/21 1655)  ?  ? ?Procedures  /  Critical Care ?Procedures ? ?ED Course and Medical Decision Making  ?I have reviewed the triage vital signs, the nursing notes, and pertinent available records from the EMR. ? ?Complexity of Problems Addressed: ?Moderate Complexity: Chronic illness with exacerbation (poorly controlled chronic illness, but not requiring further diagnostics or treatment in ED) ?Comorbidities affecting this illness/injury  include: ?HIV ?Social Determinants Affecting Care: ?Complexity of care is increased due to access to medical care. ?SW/CM consulted ordered for assistance with getting patient into ID clinic. ? ?ED Course: ?After considering the following differential, cough, SOB, fatigue, hypoglycemia, I ordered nystatin swish and swallow for oropharyngeal candidiasis. ?I personally interpreted the labs which are notable for anemia to 9.6.  No hypoglyemia. ?I visualized the chest x-ray which is notable for no infiltrate and agree with the radiologist interpretation.. ? ?  ? ?Consultants: ?No consultations were needed in caring for this patient. ? ?Treatment and Plan: ?Patient here with fatigue.  He also reports chronic cough, but states that there is not new symptoms in  regard to this.  I will start patient on Biktarvy and give nystatin swish and swallow.  Patient advised to contact ID clinic for follow-up. ? ?Emergency department workup does not suggest an emergent condition requiring admission or immediate intervention beyond  what has been performed at this time. The patient is safe for discharge and has  been instructed to return immediately for worsening symptoms, change in  symptoms or any other concerns ? ? ? ?Final Clinical Impressions(s) / ED Diagnoses  ? ?  ICD-10-CM   ?1. Oropharyngeal candidiasis  B37.0   ?  ?2. Anemia of chronic disease  D63.8   ?  ?  ?ED Discharge Orders   ? ?      Ordered  ?  bictegravir-emtricitabine-tenofovir AF (BIKTARVY) 50-200-25 MG TABS tablet  Daily       ? 10/02/21 2300  ?  nystatin (MYCOSTATIN) 100000 UNIT/ML suspension  4 times daily       ? 10/02/21 2300  ? ?  ?  ? ?  ?  ? ? ?Discharge Instructions Discussed with and Provided to Patient:  ? ? ? ?Discharge Instructions   ? ?  ?Please follow-up in the infectious disease clinic.  They will see you without insurance.  Please take medications as prescribed.   ? ? ? ? ?  ?Roxy Horseman, PA-C ?10/02/21 2302 ? ?  ?Ernie Avena, MD ?10/02/21 2323 ? ?

## 2021-10-02 NOTE — ED Triage Notes (Signed)
Pt reports SOB "since I was younger" and cough x 1 month with yellow phlegm. ?

## 2021-10-03 NOTE — ED Notes (Signed)
Patient left ED without discharge paperwork. This RN tried to run after him, but patient had already left ED by the time this RN got to the lobby. ?

## 2021-10-07 LAB — CULTURE, BLOOD (ROUTINE X 2)
Culture: NO GROWTH
Culture: NO GROWTH
Special Requests: ADEQUATE
Special Requests: ADEQUATE

## 2021-10-21 ENCOUNTER — Emergency Department (HOSPITAL_COMMUNITY)
Admission: EM | Admit: 2021-10-21 | Discharge: 2021-10-21 | Disposition: A | Discharge status: Home / Self Care | Payer: Self-pay | Attending: Emergency Medicine | Admitting: Emergency Medicine

## 2021-10-21 ENCOUNTER — Emergency Department (HOSPITAL_COMMUNITY): Payer: Self-pay

## 2021-10-21 ENCOUNTER — Encounter (HOSPITAL_COMMUNITY): Payer: Self-pay | Admitting: Emergency Medicine

## 2021-10-21 DIAGNOSIS — Z21 Asymptomatic human immunodeficiency virus [HIV] infection status: Secondary | ICD-10-CM | POA: Insufficient documentation

## 2021-10-21 DIAGNOSIS — R4182 Altered mental status, unspecified: Secondary | ICD-10-CM | POA: Insufficient documentation

## 2021-10-21 DIAGNOSIS — R000 Tachycardia, unspecified: Secondary | ICD-10-CM | POA: Insufficient documentation

## 2021-10-21 DIAGNOSIS — B370 Candidal stomatitis: Secondary | ICD-10-CM | POA: Insufficient documentation

## 2021-10-21 DIAGNOSIS — Z20822 Contact with and (suspected) exposure to covid-19: Secondary | ICD-10-CM | POA: Insufficient documentation

## 2021-10-21 DIAGNOSIS — R531 Weakness: Secondary | ICD-10-CM | POA: Insufficient documentation

## 2021-10-21 DIAGNOSIS — E871 Hypo-osmolality and hyponatremia: Secondary | ICD-10-CM | POA: Insufficient documentation

## 2021-10-21 DIAGNOSIS — J189 Pneumonia, unspecified organism: Secondary | ICD-10-CM

## 2021-10-21 DIAGNOSIS — J181 Lobar pneumonia, unspecified organism: Secondary | ICD-10-CM | POA: Insufficient documentation

## 2021-10-21 DIAGNOSIS — R410 Disorientation, unspecified: Secondary | ICD-10-CM | POA: Insufficient documentation

## 2021-10-21 DIAGNOSIS — R627 Adult failure to thrive: Secondary | ICD-10-CM | POA: Insufficient documentation

## 2021-10-21 DIAGNOSIS — Z7982 Long term (current) use of aspirin: Secondary | ICD-10-CM | POA: Insufficient documentation

## 2021-10-21 LAB — COMPREHENSIVE METABOLIC PANEL
ALT: 32 U/L (ref 0–44)
AST: 56 U/L — ABNORMAL HIGH (ref 15–41)
Albumin: 2.6 g/dL — ABNORMAL LOW (ref 3.5–5.0)
Alkaline Phosphatase: 57 U/L (ref 38–126)
Anion gap: 10 (ref 5–15)
BUN: 21 mg/dL — ABNORMAL HIGH (ref 6–20)
CO2: 25 mmol/L (ref 22–32)
Calcium: 8.5 mg/dL — ABNORMAL LOW (ref 8.9–10.3)
Chloride: 95 mmol/L — ABNORMAL LOW (ref 98–111)
Creatinine, Ser: 0.9 mg/dL (ref 0.61–1.24)
GFR, Estimated: >60 mL/min (ref >60)
Glucose, Bld: 103 mg/dL — ABNORMAL HIGH (ref 70–99)
Potassium: 4 mmol/L (ref 3.5–5.1)
Sodium: 130 mmol/L — ABNORMAL LOW (ref 135–145)
Total Bilirubin: 0.6 mg/dL (ref 0.3–1.2)
Total Protein: 7.6 g/dL (ref 6.5–8.1)

## 2021-10-21 LAB — URINALYSIS, ROUTINE W REFLEX MICROSCOPIC
Bilirubin Urine: NEGATIVE
Glucose, UA: NEGATIVE mg/dL
Hgb urine dipstick: NEGATIVE
Ketones, ur: NEGATIVE mg/dL
Leukocytes,Ua: NEGATIVE
Nitrite: NEGATIVE
Protein, ur: NEGATIVE mg/dL
Specific Gravity, Urine: 1.035 — ABNORMAL HIGH (ref 1.005–1.030)
pH: 5 (ref 5.0–8.0)

## 2021-10-21 LAB — CBC WITH DIFFERENTIAL/PLATELET
Abs Immature Granulocytes: 0.72 10*3/uL — ABNORMAL HIGH (ref 0.00–0.07)
Basophils Absolute: 0 10*3/uL (ref 0.0–0.1)
Basophils Relative: 1 %
Eosinophils Absolute: 0 10*3/uL (ref 0.0–0.5)
Eosinophils Relative: 0 %
HCT: 28.4 % — ABNORMAL LOW (ref 39.0–52.0)
Hemoglobin: 9.6 g/dL — ABNORMAL LOW (ref 13.0–17.0)
Immature Granulocytes: 12 %
Lymphocytes Relative: 4 %
Lymphs Abs: 0.2 10*3/uL — ABNORMAL LOW (ref 0.7–4.0)
MCH: 32.2 pg (ref 26.0–34.0)
MCHC: 33.8 g/dL (ref 30.0–36.0)
MCV: 95.3 fL (ref 80.0–100.0)
Monocytes Absolute: 0.4 10*3/uL (ref 0.1–1.0)
Monocytes Relative: 7 %
Neutro Abs: 4.5 10*3/uL (ref 1.7–7.7)
Neutrophils Relative %: 76 %
Platelets: 156 10*3/uL (ref 150–400)
RBC: 2.98 MIL/uL — ABNORMAL LOW (ref 4.22–5.81)
RDW: 13 % (ref 11.5–15.5)
WBC: 5.9 10*3/uL (ref 4.0–10.5)
nRBC: 0 % (ref 0.0–0.2)

## 2021-10-21 LAB — RAPID HIV SCREEN (HIV 1/2 AB+AG)
HIV 1/2 Antibodies: REACTIVE — AB
HIV-1 P24 Antigen - HIV24: NONREACTIVE

## 2021-10-21 LAB — RESP PANEL BY RT-PCR (FLU A&B, COVID) ARPGX2
Influenza A by PCR: NEGATIVE
Influenza B by PCR: NEGATIVE
SARS Coronavirus 2 by RT PCR: NEGATIVE

## 2021-10-21 LAB — PHOSPHORUS: Phosphorus: 4.4 mg/dL (ref 2.5–4.6)

## 2021-10-21 LAB — MAGNESIUM: Magnesium: 2.5 mg/dL — ABNORMAL HIGH (ref 1.7–2.4)

## 2021-10-21 LAB — LACTIC ACID, PLASMA: Lactic Acid, Venous: 1.2 mmol/L (ref 0.5–1.9)

## 2021-10-21 MED ORDER — AMOXICILLIN 500 MG PO CAPS: 500.0000 mg | ORAL_CAPSULE | Freq: Three times a day (TID) | ORAL | 0 refills | Status: DC

## 2021-10-21 MED ORDER — IOHEXOL 300 MG/ML  SOLN
75.0000 mL | Freq: Once | INTRAMUSCULAR | Status: AC | PRN
  Administered 2021-10-21: 75 mL via INTRAVENOUS

## 2021-10-21 MED ORDER — DOXYCYCLINE HYCLATE 100 MG PO CAPS: 100.0000 mg | ORAL_CAPSULE | Freq: Two times a day (BID) | ORAL | 0 refills | Status: DC

## 2021-10-21 MED ORDER — NYSTATIN 100000 UNIT/ML MT SUSP: 500000.0000 [IU] | Freq: Four times a day (QID) | OROMUCOSAL | 0 refills | Status: DC

## 2021-10-21 MED ORDER — BIKTARVY 50-200-25 MG PO TABS: 1.0000 | ORAL_TABLET | Freq: Every day | ORAL | 0 refills | Status: DC

## 2021-10-21 MED ORDER — SODIUM CHLORIDE 0.9 % IV BOLUS
1000.0000 mL | Freq: Once | INTRAVENOUS | Status: AC
  Administered 2021-10-21: 1000 mL via INTRAVENOUS

## 2021-10-21 MED ORDER — SODIUM CHLORIDE (PF) 0.9 % IJ SOLN
INTRAMUSCULAR | Status: AC
  Filled 2021-10-21: qty 50

## 2021-10-21 MED ORDER — NYSTATIN 100000 UNIT/ML MT SUSP
5.0000 mL | Freq: Four times a day (QID) | OROMUCOSAL | Status: DC
  Administered 2021-10-21: 500000 [IU] via ORAL
  Filled 2021-10-21 (×2): qty 5

## 2021-10-21 NOTE — ED Provider Notes (Signed)
?[facility]-EMERGENCY DEPT ?Provider Note ? ? ?CSN: [id number] ?Arrival date & time: 10/21/21  1641 ? ?  ? ?History ? ?Chief Complaint  ?Patient presents with  ? Altered Mental Status  ? ? ?Jason Herman is a 36 y.o. male. ? ?The history is provided by the patient and a relative. No language interpreter was used.  ?Altered Mental Status ? ?This is a 36 year old male with history of HIV noncompliant on his medication who brought  in accompanied by his cousin with report of altered mental status.  Per patient's cousin, patient has been living off the street for several years however family member noticed that patient has been losing quite a bit of weight and eventually they now taking care of him.  He is currently living at his grandma's house.  Cousin mentioned patient is not doing well, he is having trouble eating and drinking due to ongoing sore throat as well as not taking any medication.  He seems to be "out of it" and therefore patient brought here for further management.  Patient without any specific complaint except stated that he has an appetite but having trouble eating due to his throat discomfort.  This is an ongoing issue.  He denies any chest pain or shortness of breath abdominal pain or urinary symptoms.  He denies any rash.  He admits to feeling weak and tired.  He does not have an infectious disease specialist but would like to have one.  Patient however is a poor historian.  No complaint of headache or neck stiffness. ? ?Home Medications ?Prior to Admission medications   ?Medication Sig Start Date End Date Taking? Authorizing Provider  ?Artificial Tear Ointment (DRY EYES OP) Apply 1 drop to eye daily as needed (dry eyes).    [provider]  ?Aspirin-Salicylamide-Caffeine (BC HEADACHE PO) Take 1 packet by mouth daily as needed (headache).    [provider]  ?bictegravir-emtricitabine-tenofovir AF (BIKTARVY) 50-200-25 MG TABS tablet Take 1 tablet by mouth daily.  10/02/21   Roxy Horseman, PA-C  ?neomycin-bacitracin-polymyxin (NEOSPORIN) ointment Apply 1 application topically as needed for wound care.    [provider]  ?oxyCODONE-acetaminophen (PERCOCET/ROXICET) 5-325 MG tablet Take 1 tablet by mouth every 6 (six) hours as needed for severe pain. ?Patient not taking: Reported on 11/29/2020 03/24/20   Maia Plan, MD  ?senna-docusate (SENOKOT-S) 8.6-50 MG tablet Take 1 tablet by mouth at bedtime as needed for mild constipation or moderate constipation. ?Patient not taking: Reported on 11/29/2020 03/24/20   Long, Arlyss Repress, MD  ?   ? ?Allergies    ?Patient has no known allergies.   ? ?Review of Systems   ?Review of Systems  ?All other systems reviewed and are negative. ? ?Physical Exam ?Updated Vital Signs ?BP 109/76 (BP Location: Left Arm)   Pulse (!) 114   Temp 98.3 ?F (36.8 ?C) (Oral)   Resp 18   SpO2 100%  ?Physical Exam ?Vitals and nursing note reviewed.  ?Constitutional:   ?   General: He is not in acute distress. ?   Appearance: He is well-developed.  ?   Comments: [place] male with evidence of temporal wasting but appears to be in no acute discomfort.  ?HENT:  ?   Head: Atraumatic.  ?   Mouth/Throat:  ?   Comments: Whitish plaque noted to oromucosa and tongue consistent with oral candidiasis ?Eyes:  ?   Conjunctiva/sclera: Conjunctivae normal.  ?Neck:  ?   Comments: No nuchal rigidity ?Cardiovascular:  ?  Rate and Rhythm: Tachycardia present.  ?   Pulses: Normal pulses.  ?   Heart sounds: Normal heart sounds.  ?Pulmonary:  ?   Effort: Pulmonary effort is normal.  ?   Breath sounds: Normal breath sounds. No wheezing, rhonchi or rales.  ?Abdominal:  ?   Palpations: Abdomen is soft.  ?   Tenderness: There is no abdominal tenderness.  ?Musculoskeletal:  ?   Cervical back: Normal range of motion and neck supple. No rigidity.  ?Skin: ?   Findings: No rash.  ?Neurological:  ?   Mental Status: He is alert. He is disoriented.  ?   Comments: Answer  question poorly but is goal oriented and follows commands.  Alert to self situation and place but not to time date or current president  ?Psychiatric:     ?   Mood and Affect: Mood normal.  ? ? ?ED Results / Procedures / Treatments   ?Labs ?(all labs ordered are listed, but only abnormal results are displayed) ?Labs Reviewed  ?COMPREHENSIVE METABOLIC PANEL - Abnormal; Notable for the following components:  ?    Result Value  ? Sodium 130 (*)   ? Chloride 95 (*)   ? Glucose, Bld 103 (*)   ? BUN 21 (*)   ? Calcium 8.5 (*)   ? Albumin 2.6 (*)   ? AST 56 (*)   ? All other components within normal limits  ?URINALYSIS, ROUTINE W REFLEX MICROSCOPIC - Abnormal; Notable for the following components:  ? Specific Gravity, Urine 1.035 (*)   ? All other components within normal limits  ?RAPID HIV SCREEN (HIV 1/2 AB+AG) - Abnormal; Notable for the following components:  ? HIV 1/2 Antibodies Reactive (*)   ? All other components within normal limits  ?MAGNESIUM - Abnormal; Notable for the following components:  ? Magnesium 2.5 (*)   ? All other components within normal limits  ?CBC WITH DIFFERENTIAL/PLATELET - Abnormal; Notable for the following components:  ? RBC 2.98 (*)   ? Hemoglobin 9.6 (*)   ? HCT 28.4 (*)   ? Lymphs Abs 0.2 (*)   ? Abs Immature Granulocytes 0.72 (*)   ? All other components within normal limits  ?RESP PANEL BY RT-PCR (FLU A&B, COVID) ARPGX2  ?LACTIC ACID, PLASMA  ?PHOSPHORUS  ?CBC WITH DIFFERENTIAL/PLATELET  ?RPR  ?T-HELPER CELLS (CD4) COUNT (NOT AT [facility])  ?HIV-1/2 AB - DIFFERENTIATION  ? ? ?EKG ?None ? ?Radiology ?DG Chest 2 View ? ?Result Date: 10/21/2021 ?CLINICAL DATA:  HIV positive with decreased p.o. intake, losing weight and pain all over. EXAM: CHEST - 2 VIEW COMPARISON:  October 02, 2021 FINDINGS: The heart size and mediastinal contours are within normal limits. Very mild, ill-defined areas of atelectasis versus early infiltrate are seen along the periphery of the mid right lung and right lung base.  There is no evidence of a pleural effusion or pneumothorax. The visualized skeletal structures are unremarkable. IMPRESSION: Very mild areas of atelectasis versus early infiltrate along the periphery of the mid right lung and right lung base. CT correlation is recommended. Electronically Signed   By: Aram Candela M.D.   On: 10/21/2021 17:42  ? ?CT Head W or Wo Contrast ? ?Result Date: 10/21/2021 ?CLINICAL DATA:  Altered mental status EXAM: CT HEAD WITHOUT AND WITH CONTRAST TECHNIQUE: Contiguous axial images were obtained from the base of the skull through the vertex without and with intravenous contrast. RADIATION DOSE REDUCTION: This exam was performed according to the departmental dose-optimization program  which includes automated exposure control, adjustment of the mA and/or kV according to patient size and/or use of iterative reconstruction technique. CONTRAST:  75mL OMNIPAQUE IOHEXOL 300 MG/ML  SOLN COMPARISON:  None Available. FINDINGS: Brain: There is no mass, hemorrhage or extra-axial collection. The size and configuration of the ventricles and extra-axial CSF spaces are normal. The brain parenchyma is normal, without acute or chronic infarction. Vascular: No abnormal hyperdensity of the major intracranial arteries or dural venous sinuses. No intracranial atherosclerosis. Skull: The visualized skull base, calvarium and extracranial soft tissues are normal. Sinuses/Orbits: No fluid levels or advanced mucosal thickening of the visualized paranasal sinuses. No mastoid or middle ear effusion. The orbits are normal. IMPRESSION: Normal head CT. Electronically Signed   By: Deatra RobinsonKevin  Herman M.D.   On: 10/21/2021 19:04   ? ?Procedures ?Procedures  ? ? ?Medications Ordered in ED ?Medications - No data to display ? ?ED Course/ Medical Decision Making/ A&P ?  ?                        ?Medical Decision Making ? ?BP 109/76 (BP Location: Left Arm)   Pulse (!) 114   Temp 98.3 ?F (36.8 ?C) (Oral)   Resp 18   SpO2 100%   ? ?5:23 PM ?This is a 36 year old male with history of HIV noncompliant on his medication brought in by family member due to concerns of altered mental status, excessive weight loss, and lack of p.o. intake.  I am concer

## 2021-10-21 NOTE — Discharge Instructions (Addendum)
You have been evaluated for your symptoms.  Your inability to eat is likely due to thrush, a fungal infection in your throat causing discomfort.  Please use nystatin suspension 4 times daily for the next several weeks to help aid with healing.  Resume taking your HIV medication daily for better management.  Your chest x-ray shows possibility of early signs of pneumonia therefore please take antibiotic as prescribed.  Follow-up with your infectious disease specialist for further care. ?

## 2021-10-21 NOTE — ED Triage Notes (Addendum)
Patient BIB cousin who has reportedly recently started taking care of him again. She states over the last couple of weeks he has had decreased PO intake, losing weight, pain all over and decreased level of consciousness. He is HIV positive. She states non compliant with medications due to finances. A&Ox3 in triage. ?

## 2021-10-22 LAB — RPR: RPR Ser Ql: NONREACTIVE

## 2021-10-23 ENCOUNTER — Ambulatory Visit (INDEPENDENT_AMBULATORY_CARE_PROVIDER_SITE_OTHER): Payer: Self-pay | Admitting: Infectious Diseases

## 2021-10-23 ENCOUNTER — Inpatient Hospital Stay (HOSPITAL_COMMUNITY): Payer: Self-pay

## 2021-10-23 ENCOUNTER — Other Ambulatory Visit: Payer: Self-pay

## 2021-10-23 ENCOUNTER — Ambulatory Visit: Payer: Self-pay

## 2021-10-23 ENCOUNTER — Encounter: Payer: Self-pay | Admitting: Infectious Diseases

## 2021-10-23 ENCOUNTER — Encounter (HOSPITAL_COMMUNITY): Payer: Self-pay | Admitting: Emergency Medicine

## 2021-10-23 ENCOUNTER — Emergency Department (HOSPITAL_COMMUNITY): Payer: Self-pay

## 2021-10-23 ENCOUNTER — Inpatient Hospital Stay (HOSPITAL_COMMUNITY)
Admission: EM | Admit: 2021-10-23 | Discharge: 2021-10-26 | DRG: 974 | Disposition: A | Discharge status: Home / Self Care | Payer: Self-pay | Attending: Internal Medicine | Admitting: Internal Medicine

## 2021-10-23 DIAGNOSIS — B3781 Candidal esophagitis: Secondary | ICD-10-CM | POA: Diagnosis present

## 2021-10-23 DIAGNOSIS — Z5971 Insufficient health insurance coverage: Secondary | ICD-10-CM | POA: Insufficient documentation

## 2021-10-23 DIAGNOSIS — R6520 Severe sepsis without septic shock: Secondary | ICD-10-CM | POA: Diagnosis present

## 2021-10-23 DIAGNOSIS — R634 Abnormal weight loss: Secondary | ICD-10-CM | POA: Diagnosis present

## 2021-10-23 DIAGNOSIS — R7401 Elevation of levels of liver transaminase levels: Secondary | ICD-10-CM

## 2021-10-23 DIAGNOSIS — B20 Human immunodeficiency virus [HIV] disease: Secondary | ICD-10-CM

## 2021-10-23 DIAGNOSIS — B370 Candidal stomatitis: Secondary | ICD-10-CM

## 2021-10-23 DIAGNOSIS — J122 Parainfluenza virus pneumonia: Secondary | ICD-10-CM | POA: Diagnosis present

## 2021-10-23 DIAGNOSIS — Z608 Other problems related to social environment: Secondary | ICD-10-CM | POA: Diagnosis present

## 2021-10-23 DIAGNOSIS — R630 Anorexia: Secondary | ICD-10-CM | POA: Diagnosis present

## 2021-10-23 DIAGNOSIS — Z87891 Personal history of nicotine dependence: Secondary | ICD-10-CM

## 2021-10-23 DIAGNOSIS — Z5989 Other problems related to housing and economic circumstances: Secondary | ICD-10-CM | POA: Insufficient documentation

## 2021-10-23 DIAGNOSIS — J189 Pneumonia, unspecified organism: Secondary | ICD-10-CM

## 2021-10-23 DIAGNOSIS — G9341 Metabolic encephalopathy: Secondary | ICD-10-CM | POA: Diagnosis present

## 2021-10-23 DIAGNOSIS — A419 Sepsis, unspecified organism: Secondary | ICD-10-CM

## 2021-10-23 DIAGNOSIS — Z20822 Contact with and (suspected) exposure to covid-19: Secondary | ICD-10-CM | POA: Diagnosis present

## 2021-10-23 DIAGNOSIS — Z681 Body mass index (BMI) 19 or less, adult: Secondary | ICD-10-CM

## 2021-10-23 DIAGNOSIS — R7989 Other specified abnormal findings of blood chemistry: Secondary | ICD-10-CM | POA: Diagnosis present

## 2021-10-23 DIAGNOSIS — D649 Anemia, unspecified: Secondary | ICD-10-CM

## 2021-10-23 DIAGNOSIS — Z21 Asymptomatic human immunodeficiency virus [HIV] infection status: Secondary | ICD-10-CM

## 2021-10-23 DIAGNOSIS — E43 Unspecified severe protein-calorie malnutrition: Secondary | ICD-10-CM | POA: Insufficient documentation

## 2021-10-23 DIAGNOSIS — J181 Lobar pneumonia, unspecified organism: Secondary | ICD-10-CM

## 2021-10-23 DIAGNOSIS — Z91148 Patient's other noncompliance with medication regimen for other reason: Secondary | ICD-10-CM

## 2021-10-23 DIAGNOSIS — Z833 Family history of diabetes mellitus: Secondary | ICD-10-CM

## 2021-10-23 DIAGNOSIS — R197 Diarrhea, unspecified: Secondary | ICD-10-CM | POA: Diagnosis present

## 2021-10-23 DIAGNOSIS — R1310 Dysphagia, unspecified: Secondary | ICD-10-CM

## 2021-10-23 LAB — URINALYSIS, ROUTINE W REFLEX MICROSCOPIC
Bilirubin Urine: NEGATIVE
Glucose, UA: NEGATIVE mg/dL
Hgb urine dipstick: NEGATIVE
Ketones, ur: NEGATIVE mg/dL
Nitrite: NEGATIVE
Protein, ur: 30 mg/dL — AB
Specific Gravity, Urine: 1.021 (ref 1.005–1.030)
pH: 6 (ref 5.0–8.0)

## 2021-10-23 LAB — CBC WITH DIFFERENTIAL/PLATELET
Abs Immature Granulocytes: 0.57 10*3/uL — ABNORMAL HIGH (ref 0.00–0.07)
Basophils Absolute: 0 10*3/uL (ref 0.0–0.1)
Basophils Relative: 1 %
Eosinophils Absolute: 0 10*3/uL (ref 0.0–0.5)
Eosinophils Relative: 0 %
HCT: 30.2 % — ABNORMAL LOW (ref 39.0–52.0)
Hemoglobin: 10.1 g/dL — ABNORMAL LOW (ref 13.0–17.0)
Immature Granulocytes: 10 %
Lymphocytes Relative: 10 %
Lymphs Abs: 0.6 10*3/uL — ABNORMAL LOW (ref 0.7–4.0)
MCH: 32.3 pg (ref 26.0–34.0)
MCHC: 33.4 g/dL (ref 30.0–36.0)
MCV: 96.5 fL (ref 80.0–100.0)
Monocytes Absolute: 0.4 10*3/uL (ref 0.1–1.0)
Monocytes Relative: 7 %
Neutro Abs: 4.1 10*3/uL (ref 1.7–7.7)
Neutrophils Relative %: 72 %
Platelets: 210 10*3/uL (ref 150–400)
RBC: 3.13 MIL/uL — ABNORMAL LOW (ref 4.22–5.81)
RDW: 13.1 % (ref 11.5–15.5)
WBC: 5.7 10*3/uL (ref 4.0–10.5)
nRBC: 0 % (ref 0.0–0.2)

## 2021-10-23 LAB — COMPREHENSIVE METABOLIC PANEL
ALT: 31 U/L (ref 0–44)
AST: 66 U/L — ABNORMAL HIGH (ref 15–41)
Albumin: 2.3 g/dL — ABNORMAL LOW (ref 3.5–5.0)
Alkaline Phosphatase: 56 U/L (ref 38–126)
Anion gap: 10 (ref 5–15)
BUN: 15 mg/dL (ref 6–20)
CO2: 25 mmol/L (ref 22–32)
Calcium: 8.4 mg/dL — ABNORMAL LOW (ref 8.9–10.3)
Chloride: 97 mmol/L — ABNORMAL LOW (ref 98–111)
Creatinine, Ser: 0.83 mg/dL (ref 0.61–1.24)
GFR, Estimated: >60 mL/min (ref >60)
Glucose, Bld: 86 mg/dL (ref 70–99)
Potassium: 4.2 mmol/L (ref 3.5–5.1)
Sodium: 132 mmol/L — ABNORMAL LOW (ref 135–145)
Total Bilirubin: 0.6 mg/dL (ref 0.3–1.2)
Total Protein: 7.8 g/dL (ref 6.5–8.1)

## 2021-10-23 LAB — RESPIRATORY PANEL BY PCR
Adenovirus: NOT DETECTED
Bordetella Parapertussis: NOT DETECTED
Bordetella pertussis: NOT DETECTED
Chlamydophila pneumoniae: NOT DETECTED
Coronavirus 229E: NOT DETECTED
Coronavirus HKU1: NOT DETECTED
Coronavirus NL63: NOT DETECTED
Coronavirus OC43: NOT DETECTED
Influenza A: NOT DETECTED
Influenza B: NOT DETECTED
Metapneumovirus: NOT DETECTED
Mycoplasma pneumoniae: NOT DETECTED
Parainfluenza Virus 1: DETECTED — AB
Parainfluenza Virus 2: NOT DETECTED
Parainfluenza Virus 3: DETECTED — AB
Parainfluenza Virus 4: NOT DETECTED
Respiratory Syncytial Virus: NOT DETECTED
Rhinovirus / Enterovirus: NOT DETECTED

## 2021-10-23 LAB — RESP PANEL BY RT-PCR (FLU A&B, COVID) ARPGX2
Influenza A by PCR: NEGATIVE
Influenza B by PCR: NEGATIVE
SARS Coronavirus 2 by RT PCR: NEGATIVE

## 2021-10-23 LAB — PROTIME-INR
INR: 1.1 (ref 0.8–1.2)
Prothrombin Time: 14 seconds (ref 11.4–15.2)

## 2021-10-23 LAB — APTT: aPTT: 35 seconds (ref 24–36)

## 2021-10-23 LAB — LACTIC ACID, PLASMA: Lactic Acid, Venous: 1.5 mmol/L (ref 0.5–1.9)

## 2021-10-23 MED ORDER — ACETAMINOPHEN 325 MG PO TABS
650.0000 mg | ORAL_TABLET | Freq: Once | ORAL | Status: AC
  Administered 2021-10-23: 650 mg via ORAL
  Filled 2021-10-23: qty 2

## 2021-10-23 MED ORDER — ENOXAPARIN SODIUM 40 MG/0.4ML IJ SOSY
40.0000 mg | PREFILLED_SYRINGE | INTRAMUSCULAR | Status: DC
  Administered 2021-10-23 – 2021-10-24 (×2): 40 mg via SUBCUTANEOUS
  Filled 2021-10-23 (×2): qty 0.4

## 2021-10-23 MED ORDER — NYSTATIN 100000 UNIT/ML MT SUSP: 5.0000 mL | Freq: Four times a day (QID) | OROMUCOSAL | Status: DC

## 2021-10-23 MED ORDER — IOHEXOL 350 MG/ML SOLN
60.0000 mL | Freq: Once | INTRAVENOUS | Status: AC | PRN
  Administered 2021-10-23: 60 mL via INTRAVENOUS

## 2021-10-23 MED ORDER — SODIUM CHLORIDE 0.9 % IV BOLUS
1000.0000 mL | Freq: Once | INTRAVENOUS | Status: AC
  Administered 2021-10-23: 1000 mL via INTRAVENOUS

## 2021-10-23 MED ORDER — ADULT MULTIVITAMIN W/MINERALS CH
1.0000 | ORAL_TABLET | Freq: Every day | ORAL | Status: DC
  Administered 2021-10-23 – 2021-10-24 (×2): 1 via ORAL
  Filled 2021-10-23 (×3): qty 1

## 2021-10-23 MED ORDER — ACETAMINOPHEN 650 MG RE SUPP: 650.0000 mg | Freq: Four times a day (QID) | RECTAL | Status: DC | PRN

## 2021-10-23 MED ORDER — AZITHROMYCIN 500 MG PO TABS
500.0000 mg | ORAL_TABLET | Freq: Every day | ORAL | Status: DC
  Administered 2021-10-24 – 2021-10-26 (×4): 500 mg via ORAL
  Filled 2021-10-23 (×4): qty 1

## 2021-10-23 MED ORDER — CEFEPIME HCL 2 G IV SOLR
2.0000 g | Freq: Three times a day (TID) | INTRAVENOUS | Status: DC
  Administered 2021-10-23 – 2021-10-24 (×3): 2 g via INTRAVENOUS
  Filled 2021-10-23 (×3): qty 12.5

## 2021-10-23 MED ORDER — VANCOMYCIN HCL IN DEXTROSE 1-5 GM/200ML-% IV SOLN
1000.0000 mg | Freq: Once | INTRAVENOUS | Status: AC
  Administered 2021-10-23: 1000 mg via INTRAVENOUS
  Filled 2021-10-23: qty 200

## 2021-10-23 MED ORDER — FLUCONAZOLE IN SODIUM CHLORIDE 200-0.9 MG/100ML-% IV SOLN
200.0000 mg | INTRAVENOUS | Status: DC
  Administered 2021-10-23: 200 mg via INTRAVENOUS
  Filled 2021-10-23 (×2): qty 100

## 2021-10-23 MED ORDER — POLYETHYLENE GLYCOL 3350 17 G PO PACK: 17.0000 g | PACK | Freq: Every day | ORAL | Status: DC | PRN

## 2021-10-23 MED ORDER — VANCOMYCIN HCL 500 MG/100ML IV SOLN
500.0000 mg | Freq: Two times a day (BID) | INTRAVENOUS | Status: DC
Start: 2021-10-24 — End: 2021-10-24
  Administered 2021-10-24: 500 mg via INTRAVENOUS
  Filled 2021-10-23 (×2): qty 100

## 2021-10-23 MED ORDER — BICTEGRAVIR-EMTRICITAB-TENOFOV 50-200-25 MG PO TABS
1.0000 | ORAL_TABLET | Freq: Every day | ORAL | Status: DC
  Administered 2021-10-23 – 2021-10-26 (×4): 1 via ORAL
  Filled 2021-10-23 (×4): qty 1

## 2021-10-23 MED ORDER — ACETAMINOPHEN 325 MG PO TABS
650.0000 mg | ORAL_TABLET | Freq: Four times a day (QID) | ORAL | Status: DC | PRN
  Administered 2021-10-24 – 2021-10-25 (×4): 650 mg via ORAL
  Filled 2021-10-23 (×4): qty 2

## 2021-10-23 NOTE — ED Provider Notes (Signed)
?[facility] EMERGENCY DEPARTMENT ?Provider Note ? ? ?CSN: [id number] ?Arrival date & time: 10/23/21  1614 ? ?  ? ?History ? ?Chief Complaint  ?Patient presents with  ? Fever  ? ? ?ROCZEN WAYMIRE is a 36 y.o. male. ? ?Patient sent from infectious disease office for concern for ongoing pneumonia with failed outpatient treatment.  Had a fever and still mildly confused.  Has bad thrush and has been on nystatin.  Has been on doxycycline and amoxicillin for pneumonia that was diagnosed a couple days ago.  History of HIV.  Restarted triple therapy couple days ago and followed up with ID today.  Had not been compliant with medications for 5 years.  Has ongoing fever.  Still has discomfort in his mouth.  Denies any headache, abdominal pain, nausea, vomiting. ? ?The history is provided by the patient.  ?Fever ? ?  ? ?Home Medications ?Prior to Admission medications   ?Medication Sig Start Date End Date Taking? Authorizing Provider  ?amoxicillin (AMOXIL) 500 MG capsule Take 1 capsule (500 mg total) by mouth 3 (three) times daily. ?Patient not taking: Reported on 10/23/2021 10/21/21   Fayrene Helper, PA-C  ?Artificial Tear Ointment (DRY EYES OP) Apply 1 drop to eye daily as needed (dry eyes). ?Patient not taking: Reported on 10/23/2021    [provider]  ?Aspirin-Salicylamide-Caffeine (BC HEADACHE PO) Take 1 packet by mouth daily as needed (headache). ?Patient not taking: Reported on 10/23/2021    [provider]  ?bictegravir-emtricitabine-tenofovir AF (BIKTARVY) 50-200-25 MG TABS tablet Take 1 tablet by mouth daily. ?Patient not taking: Reported on 10/23/2021 10/21/21   Fayrene Helper, PA-C  ?doxycycline (VIBRAMYCIN) 100 MG capsule Take 1 capsule (100 mg total) by mouth 2 (two) times daily. One po bid x 7 days ?Patient not taking: Reported on 10/23/2021 10/21/21   Fayrene Helper, PA-C  ?neomycin-bacitracin-polymyxin (NEOSPORIN) ointment Apply 1 application topically as needed for wound care. ?Patient not taking:  Reported on 10/23/2021    [provider]  ?nystatin (MYCOSTATIN) 100000 UNIT/ML suspension Take 5 mLs (500,000 Units total) by mouth 4 (four) times daily. ?Patient not taking: Reported on 10/23/2021 10/21/21   Fayrene Helper, PA-C  ?senna-docusate (SENOKOT-S) 8.6-50 MG tablet Take 1 tablet by mouth at bedtime as needed for mild constipation or moderate constipation. ?Patient not taking: Reported on 10/23/2021 03/24/20   Long, Arlyss Repress, MD  ?   ? ?Allergies    ?Patient has no known allergies.   ? ?Review of Systems   ?Review of Systems  ?Constitutional:  Positive for fever.  ? ?Physical Exam ?Updated Vital Signs ?BP 106/78   Pulse (!) 136   Temp (!) 102.9 ?F (39.4 ?C) (Oral)   Resp 20   Ht 6' (1.829 m)   Wt 53 kg   SpO2 93%   BMI 15.85 kg/m?  ?Physical Exam ?Vitals and nursing note reviewed.  ?Constitutional:   ?   General: He is not in acute distress. ?   Appearance: He is well-developed.  ?HENT:  ?   Head: Normocephalic and atraumatic.  ?   Right Ear: Tympanic membrane normal.  ?   Mouth/Throat:  ?   Comments: Oral thrush ?Eyes:  ?   Extraocular Movements: Extraocular movements intact.  ?   Conjunctiva/sclera: Conjunctivae normal.  ?   Pupils: Pupils are equal, round, and reactive to light.  ?Cardiovascular:  ?   Rate and Rhythm: Normal rate and regular rhythm.  ?   Pulses: Normal pulses.  ?  Heart sounds: Normal heart sounds. No murmur heard. ?Pulmonary:  ?   Effort: Pulmonary effort is normal. No respiratory distress.  ?   Comments: Coarse breath sounds ?Abdominal:  ?   General: Abdomen is flat.  ?   Palpations: Abdomen is soft.  ?   Tenderness: There is no abdominal tenderness.  ?Musculoskeletal:     ?   General: No swelling.  ?   Cervical back: Neck supple.  ?Skin: ?   General: Skin is warm and dry.  ?   Capillary Refill: Capillary refill takes less than 2 seconds.  ?Neurological:  ?   General: No focal deficit present.  ?   Mental Status: He is alert and oriented to person, place, and time.  ?   Sensory:  No sensory deficit.  ?   Motor: No weakness.  ?Psychiatric:     ?   Mood and Affect: Mood normal.  ? ? ?ED Results / Procedures / Treatments   ?Labs ?(all labs ordered are listed, but only abnormal results are displayed) ?Labs Reviewed  ?COMPREHENSIVE METABOLIC PANEL - Abnormal; Notable for the following components:  ?    Result Value  ? Sodium 132 (*)   ? Chloride 97 (*)   ? Calcium 8.4 (*)   ? Albumin 2.3 (*)   ? AST 66 (*)   ? All other components within normal limits  ?CBC WITH DIFFERENTIAL/PLATELET - Abnormal; Notable for the following components:  ? RBC 3.13 (*)   ? Hemoglobin 10.1 (*)   ? HCT 30.2 (*)   ? All other components within normal limits  ?CULTURE, BLOOD (ROUTINE X 2)  ?CULTURE, BLOOD (ROUTINE X 2)  ?URINE CULTURE  ?RESP PANEL BY RT-PCR (FLU A&B, COVID) ARPGX2  ?LACTIC ACID, PLASMA  ?APTT  ?PROTIME-INR  ?LACTIC ACID, PLASMA  ?URINALYSIS, ROUTINE W REFLEX MICROSCOPIC  ?T-HELPER CELLS (CD4) COUNT (NOT AT [facility])  ? ? ?EKG ?EKG Interpretation ? ?Date/Time:  Monday Oct 23 2021 17:47:09 EDT ?Ventricular Rate:  124 ?PR Interval:  135 ?QRS Duration: 79 ?QT Interval:  304 ?QTC Calculation: 437 ?R Axis:   98 ?Text Interpretation: Sinus tachycardia Borderline right axis deviation Confirmed by Lockie Molauratolo, Shellia Hartl ([id number]) on 10/23/2021 6:09:54 PM ? ?Radiology ?DG Chest 1 View ? ?Result Date: 10/23/2021 ?CLINICAL DATA:  Cough, fever EXAM: CHEST  1 VIEW COMPARISON:  10/21/2021 FINDINGS: The heart size and mediastinal contours are within normal limits. Increasing airspace opacity within the right upper lobe. Previously seen right lower lobe opacity has slightly improved. No pleural effusion or pneumothorax. The visualized skeletal structures are unremarkable. IMPRESSION: Worsening right upper lobe pneumonia. Previously seen right lower lobe opacity has slightly improved. Electronically Signed   By: Duanne GuessNicholas  Plundo D.O.   On: 10/23/2021 17:35  ? ?CT Head W or Wo Contrast ? ?Result Date: 10/21/2021 ?CLINICAL DATA:  Altered mental  status EXAM: CT HEAD WITHOUT AND WITH CONTRAST TECHNIQUE: Contiguous axial images were obtained from the base of the skull through the vertex without and with intravenous contrast. RADIATION DOSE REDUCTION: This exam was performed according to the departmental dose-optimization program which includes automated exposure control, adjustment of the mA and/or kV according to patient size and/or use of iterative reconstruction technique. CONTRAST:  75mL OMNIPAQUE IOHEXOL 300 MG/ML  SOLN COMPARISON:  None Available. FINDINGS: Brain: There is no mass, hemorrhage or extra-axial collection. The size and configuration of the ventricles and extra-axial CSF spaces are normal. The brain parenchyma is normal, without acute or chronic infarction. Vascular: No abnormal  hyperdensity of the major intracranial arteries or dural venous sinuses. No intracranial atherosclerosis. Skull: The visualized skull base, calvarium and extracranial soft tissues are normal. Sinuses/Orbits: No fluid levels or advanced mucosal thickening of the visualized paranasal sinuses. No mastoid or middle ear effusion. The orbits are normal. IMPRESSION: Normal head CT. Electronically Signed   By: Deatra Robinson M.D.   On: 10/21/2021 19:04   ? ?Procedures ?Marland KitchenCritical Care ?Performed by: Virgina Norfolk, DO ?Authorized by: Virgina Norfolk, DO  ? ?Critical care provider statement:  ?  Critical care time (minutes):  35 ?  Critical care was necessary to treat or prevent imminent or life-threatening deterioration of the following conditions:  Sepsis ?  Critical care was time spent personally by me on the following activities:  Blood draw for specimens, development of treatment plan with patient or surrogate, discussions with primary provider, evaluation of patient's response to treatment, examination of patient, obtaining history from patient or surrogate, ordering and performing treatments and interventions, ordering and review of laboratory studies, ordering and review  of radiographic studies, pulse oximetry, re-evaluation of patient's condition and review of old charts ?  Care discussed with: admitting provider    ? ? ?Medications Ordered in ED ?Medications  ?fluconazole (D

## 2021-10-23 NOTE — Progress Notes (Signed)
Elink following code sepsis °

## 2021-10-23 NOTE — Assessment & Plan Note (Addendum)
Presumably due to esophageal candidiasis. Stop nystatin and switch to fluconazole 200 mg daily for 14d to see if he has any improvement. Will need GI referral for consideration of EGD if no improvement to rule out other possible causes.  ?

## 2021-10-23 NOTE — Patient Instructions (Signed)
Please go to either [facility] for re-evaluation  ? ?Call us for another appointment when you are discharged from hospital so we can continue to get your access to medication arranged.  ?

## 2021-10-23 NOTE — Progress Notes (Signed)
Pharmacy Antibiotic Note [name] is a 36 y.o. male admitted on 10/23/2021 with pneumonia and h/o of HIV with med noncompliance.  Pharmacy has been consulted for cefepime and vancomyin dosing. ? ?WBC wnl, SCr wnl  ? ?Plan: ?-Cefepime 2 gm IV Q 8 hours ?-Vancomycin 1 gm IV load followed by Vancomycin 500 mg IV Q 12 hrs. Goal AUC 400-550. ?Expected AUC: 466 ?SCr used: wnl  ?-Monitor CBC, renal fx, cultures and clinical progress ?-Vanc levels as indicated  ? ? ?Height: 6' (182.9 cm) ?Weight: 53 kg (116 lb 13.5 oz) ?IBW/kg (Calculated) : 77.6 ? ?Temp (24hrs), Avg:103 ?F (39.4 ?C), Min:102.9 ?F (39.4 ?C), Max:103.1 ?F (39.5 ?C) ? ?Recent Labs  ?Lab 10/21/21 ?1756 10/21/21 ?1816 10/23/21 ?1700  ?WBC  --  5.9 5.7  ?CREATININE 0.90  --  0.83  ?LATICACIDVEN 1.2  --   --   ?  ?Estimated Creatinine Clearance: 92.2 mL/min (by C-G formula based on SCr of 0.83 mg/dL).   ? ?No Known Allergies ? ?Antimicrobials this admission: ?Cefepime 5/8 >>  ?Vancomycin 5/8 >>  ? ?Dose adjustments this admission: ? ? ?Microbiology results: ?5/8 BCx:  ?5/8 UCx:   ? ?Thank you for allowing pharmacy to be a part of this patient?s care. ? ?[facility], PharmD., BCCCP ?Clinical Pharmacist ?Please refer to [facility] for unit-specific pharmacist  ? ?

## 2021-10-23 NOTE — Assessment & Plan Note (Signed)
Needs systemic fluconazole for treatment - maybe a few days of IV given intolerance of oral treatment.  ?

## 2021-10-23 NOTE — ED Provider Triage Note (Signed)
Emergency Medicine Provider Triage Evaluation Note ? ?[facility] , a 36 y.o. male  was evaluated in triage.  Pt complains of fever and cough as well as confusion.  Patient was seen on 10/21/2021 and chest x-ray showed concern for possible developing pneumonia.  Patient was started on doxycycline however was unable to pick up this medication.  Patient has had persistent fevers throughout the weekend as well as productive cough.  Patient's cousin reports that patient has continued to appear altered at times.  Patient was seen in outpatient setting by infectious disease and sent to the emergency department for further evaluation today. ? ?Patient is alert to person and place only.  ? ? ? ?Review of Systems  ?Positive: Fever, chills, cough, ?Negative: Chest pain, abdominal pain, nausea, vomiting, diarrhea, dysuria, hematuria, urinary urgency ? ?Physical Exam  ?BP 102/72 (BP Location: Right Arm)   Pulse 92   Temp (!) 102.9 ?F (39.4 ?C) (Oral)   Resp (!) 22   Ht 6' (1.829 m)   Wt 53 kg   SpO2 93%   BMI 15.85 kg/m?  ?Gen:   Awake, no distress   ?Resp:  Normal effort, Huel Cote to auscultation bilaterally ?MSK:   Moves extremities without difficulty  ?Other:  Abdomen soft, nondistended, nontender no guarding or rebound tenderness. ? ?Medical Decision Making  ?Medically screening exam initiated at 4:44 PM.  Appropriate orders placed.  [facility] was informed that the remainder of the evaluation will be completed by another provider, this initial triage assessment does not replace that evaluation, and the importance of remaining in the ED until their evaluation is complete. ? ?ED sepsis work-up initiated. ?  ?Haskel Schroeder, PA-C ?10/23/21 1645 ? ?
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
## 2021-10-23 NOTE — Assessment & Plan Note (Addendum)
Jason Herman is here to reconsider treatment for HIV disease. When last seen 5 years ago he elected to defer treatment due to him not feeling poorly and ultimately with low insight to his medical condition. We never got the opportunity to follow up with him again until care in ER recently recommended they call us back for re-engagement in care. He has spent time incarcerated and homeless but now working with family caregivers currently.  ? ?Concern for AIDS defining physical symptoms today with oral and presumably esophageal candidiasis and significant weight loss.  ? ?Will prioritize inpatient care referral for pneumonia given inability to tolerate POs. Will ask inpatient ID team to see him as I presume he needs hospital care for pneumonia.  ? ?Samples given for 2 weeks of Biktarvy - he will need to come back after hospitalization to get his financial assistance sorted out for further coverage of medication.  ?

## 2021-10-23 NOTE — Assessment & Plan Note (Addendum)
Has CAP with significant tachycardia to 130, non-hypoxic but unable to tolerate oral antibiotics. Seems a little encephalopathic as well likely 2/2 dehydration, infection and malnutrition.  He needs re-evaluation in ER for consideration for hospitalization and supportive care for a few days prior to community care.  ?His niece understands to bring him to ER now for evaluation and care.  ?
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
## 2021-10-23 NOTE — Assessment & Plan Note (Signed)
Concerning for advancing HIV / AIDS. I do think he can be recovered and improve with application of ARVs.  ?

## 2021-10-23 NOTE — ED Triage Notes (Signed)
Pt sent from infectious disease doc with fevers a few days. Hx of HIV.  ?

## 2021-10-24 ENCOUNTER — Other Ambulatory Visit: Payer: Self-pay

## 2021-10-24 ENCOUNTER — Encounter (HOSPITAL_COMMUNITY): Payer: Self-pay | Admitting: Internal Medicine

## 2021-10-24 DIAGNOSIS — A419 Sepsis, unspecified organism: Secondary | ICD-10-CM

## 2021-10-24 DIAGNOSIS — B370 Candidal stomatitis: Secondary | ICD-10-CM

## 2021-10-24 DIAGNOSIS — J189 Pneumonia, unspecified organism: Secondary | ICD-10-CM

## 2021-10-24 DIAGNOSIS — B20 Human immunodeficiency virus [HIV] disease: Secondary | ICD-10-CM

## 2021-10-24 DIAGNOSIS — R6520 Severe sepsis without septic shock: Secondary | ICD-10-CM

## 2021-10-24 LAB — IRON AND TIBC
Iron: 23 ug/dL — ABNORMAL LOW (ref 45–182)
Saturation Ratios: 11 % — ABNORMAL LOW (ref 17.9–39.5)
TIBC: 207 ug/dL — ABNORMAL LOW (ref 250–450)
UIBC: 184 ug/dL

## 2021-10-24 LAB — RAPID URINE DRUG SCREEN, HOSP PERFORMED
Amphetamines: NOT DETECTED
Barbiturates: NOT DETECTED
Benzodiazepines: NOT DETECTED
Cocaine: NOT DETECTED
Opiates: NOT DETECTED
Tetrahydrocannabinol: POSITIVE — AB

## 2021-10-24 LAB — CBC
HCT: 28.2 % — ABNORMAL LOW (ref 39.0–52.0)
Hemoglobin: 9.5 g/dL — ABNORMAL LOW (ref 13.0–17.0)
MCH: 31.8 pg (ref 26.0–34.0)
MCHC: 33.7 g/dL (ref 30.0–36.0)
MCV: 94.3 fL (ref 80.0–100.0)
Platelets: 164 10*3/uL (ref 150–400)
RBC: 2.99 MIL/uL — ABNORMAL LOW (ref 4.22–5.81)
RDW: 13.2 % (ref 11.5–15.5)
WBC: 4.8 10*3/uL (ref 4.0–10.5)
nRBC: 0 % (ref 0.0–0.2)

## 2021-10-24 LAB — HEPATITIS PANEL, ACUTE
HCV Ab: NONREACTIVE
Hep A IgM: NONREACTIVE
Hep B C IgM: NONREACTIVE
Hepatitis B Surface Ag: NONREACTIVE

## 2021-10-24 LAB — T-HELPER CELLS (CD4) COUNT (NOT AT ARMC)
CD4 % Helper T Cell: 8 % — ABNORMAL LOW (ref 33–65)
CD4 T Cell Abs: 36 /uL — ABNORMAL LOW (ref 400–1790)

## 2021-10-24 LAB — LIPID PANEL
Cholesterol: 115 mg/dL (ref 0–200)
HDL: 23 mg/dL — ABNORMAL LOW (ref >40)
LDL Cholesterol: 65 mg/dL (ref 0–99)
Total CHOL/HDL Ratio: 5 RATIO
Triglycerides: 133 mg/dL (ref <150)
VLDL: 27 mg/dL (ref 0–40)

## 2021-10-24 LAB — COMPREHENSIVE METABOLIC PANEL
ALT: 32 U/L (ref 0–44)
AST: 60 U/L — ABNORMAL HIGH (ref 15–41)
Albumin: 2.3 g/dL — ABNORMAL LOW (ref 3.5–5.0)
Alkaline Phosphatase: 57 U/L (ref 38–126)
Anion gap: 10 (ref 5–15)
BUN: 14 mg/dL (ref 6–20)
CO2: 23 mmol/L (ref 22–32)
Calcium: 8.4 mg/dL — ABNORMAL LOW (ref 8.9–10.3)
Chloride: 98 mmol/L (ref 98–111)
Creatinine, Ser: 0.82 mg/dL (ref 0.61–1.24)
GFR, Estimated: >60 mL/min (ref >60)
Glucose, Bld: 80 mg/dL (ref 70–99)
Potassium: 3.5 mmol/L (ref 3.5–5.1)
Sodium: 131 mmol/L — ABNORMAL LOW (ref 135–145)
Total Bilirubin: 0.5 mg/dL (ref 0.3–1.2)
Total Protein: 7.3 g/dL (ref 6.5–8.1)

## 2021-10-24 LAB — FERRITIN: Ferritin: 2588 ng/mL — ABNORMAL HIGH (ref 24–336)

## 2021-10-24 LAB — RETICULOCYTES
Immature Retic Fract: 16.7 % — ABNORMAL HIGH (ref 2.3–15.9)
RBC.: 2.93 MIL/uL — ABNORMAL LOW (ref 4.22–5.81)
Retic Count, Absolute: 56.3 10*3/uL (ref 19.0–186.0)
Retic Ct Pct: 1.9 % (ref 0.4–3.1)

## 2021-10-24 LAB — TECHNOLOGIST SMEAR REVIEW

## 2021-10-24 LAB — PHOSPHORUS: Phosphorus: 3.4 mg/dL (ref 2.5–4.6)

## 2021-10-24 LAB — LACTATE DEHYDROGENASE: LDH: 392 U/L — ABNORMAL HIGH (ref 98–192)

## 2021-10-24 LAB — MAGNESIUM: Magnesium: 2.2 mg/dL (ref 1.7–2.4)

## 2021-10-24 LAB — VITAMIN B12: Vitamin B-12: 952 pg/mL — ABNORMAL HIGH (ref 180–914)

## 2021-10-24 MED ORDER — SODIUM CHLORIDE 0.9 % IV SOLN
2.0000 g | INTRAVENOUS | Status: DC
  Administered 2021-10-24: 2 g via INTRAVENOUS
  Filled 2021-10-24: qty 20

## 2021-10-24 MED ORDER — SODIUM CHLORIDE 0.9 % IV SOLN: 1.0000 g | INTRAVENOUS | Status: DC

## 2021-10-24 MED ORDER — ENSURE ENLIVE PO LIQD
237.0000 mL | Freq: Three times a day (TID) | ORAL | Status: DC
  Administered 2021-10-24 – 2021-10-25 (×3): 237 mL via ORAL

## 2021-10-24 MED ORDER — FLUCONAZOLE 100 MG PO TABS
200.0000 mg | ORAL_TABLET | Freq: Every day | ORAL | Status: DC
  Administered 2021-10-24 – 2021-10-26 (×3): 200 mg via ORAL
  Filled 2021-10-24 (×3): qty 2

## 2021-10-24 MED ORDER — ADULT MULTIVITAMIN W/MINERALS CH
1.0000 | ORAL_TABLET | Freq: Every day | ORAL | Status: DC
  Administered 2021-10-25: 1 via ORAL
  Filled 2021-10-24: qty 1
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
## 2021-10-24 NOTE — Progress Notes (Signed)
Pt educated on sputum collection, pt states he only coughs up sputum in the mornings, pt given a sample collection cup just incase he coughs up ?

## 2021-10-24 NOTE — TOC Initial Note (Signed)
Transition of Care (TOC) - Initial/Assessment Note  ? ? ?Patient Details  ?Name: Jason Herman ?MRN: [id number] ?Date of Birth: 1986-06-12 ? ?Transition of Care (TOC) CM/SW Contact:    ?Tom-Johnson, Hershal Coria, RN ?Phone Number: ?10/24/2021, 4:52 PM ? ?Clinical Narrative:                 ? ?CM spoke with patient at bedside about needs for post hospital transition. Admitted for Sepsis Pneumonia and found to be positive for FLU.  ?Lives at home with his friend. Has three living children. Has two sisters in [place] but are not supportive with care. 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 Not on disability. Does not have any DME's, no PCP and no medical insurance. ?MATCH done for prescription assistance. MD to send prescription to [facility] pharmacy at discharge.  ?Hosp f/u scheduled with [facility] Care at [facility] and info on AVS.  ?Requesting transportation at discharge. ?No PT.OT recommendations noted. CM will continue to follow with needs. ?  ?Barriers to Discharge: Continued Medical Work up ? ? ?Patient Goals and CMS Choice ?Patient states their goals for this hospitalization and ongoing recovery are:: To return home ?CMS Medicare.gov Compare Post Acute Care list provided to:: Patient ?Choice offered to / list presented to : Patient ? ?Expected Discharge Plan and Services ?  ?In-house Referral: Financial Counselor ?Discharge Planning Services: CM Consult, Follow-up appt scheduled, MATCH Program ?Post Acute Care Choice: NA ?Living arrangements for the past 2 months: Single Family Home ?                ?DME Arranged: N/A ?DME Agency: NA ?  ?  ?  ?HH Arranged: NA ?HH Agency: NA ?  ?  ?  ? ?Prior Living Arrangements/Services ?Living arrangements for the past 2 months: Single Family Home ?Lives with:: Friends ?Patient language and need for interpreter reviewed:: Yes ?Do you feel safe going back to the place where you live?: Yes      ?Need for Family Participation in Patient Care: Yes (Comment) ?Care giver support  system in place?: Yes (comment) ?  ?Criminal Activity/Legal Involvement Pertinent to Current Situation/Hospitalization: No - Comment as needed ? ?Activities of Daily Living ?Home Assistive Devices/Equipment: None ?ADL Screening (condition at time of admission) ?Patient's cognitive ability adequate to safely complete daily activities?: Yes ?Is the patient deaf or have difficulty hearing?: No ?Does the patient have difficulty seeing, even when wearing glasses/contacts?: No ?Does the patient have difficulty concentrating, remembering, or making decisions?: No ?Patient able to express need for assistance with ADLs?: No ?Does the patient have difficulty dressing or bathing?: No ?Independently performs ADLs?: No ?Communication: Independent ?Dressing (OT): Independent ?Grooming: Independent ?Feeding: Independent ?Bathing: Independent ?Toileting: Independent ?In/Out Bed: Independent ?Walks in Home: Independent ?Does the patient have difficulty walking or climbing stairs?: No ?Weakness of Legs: None ?Weakness of Arms/Hands: None ? ?Permission Sought/Granted ?Permission sought to share information with : Case Manager, Family Supports, Magazine features editor ?Permission granted to share information with : Yes, Verbal Permission Granted ?   ?   ?   ?   ? ?Emotional Assessment ?Appearance:: Appears stated age ?Attitude/Demeanor/Rapport: Engaged, Gracious ?Affect (typically observed): Accepting, Appropriate, Calm, Hopeful ?Orientation: : Oriented to Self, Oriented to Place, Oriented to  Time, Oriented to Situation ?Alcohol / Substance Use: Not Applicable ?Psych Involvement: No (comment) ? ?Admission diagnosis:  Oral thrush [B37.0] ?AIDS (acquired immune deficiency syndrome) (HCC) [B20] ?Community acquired pneumonia of right lung, unspecified part of lung [J18.9] ?HIV infection,  unspecified symptom status (HCC) [B20] ?Sepsis with acute organ dysfunction, due to unspecified organism, unspecified type, unspecified whether  septic shock present (HCC) [A41.9, R65.20] ?Patient Active Problem List  ? Diagnosis Date Noted  ? Community acquired pneumonia of right lung   ? Sepsis with acute organ dysfunction (HCC)   ? Dysphagia 10/23/2021  ? Weight loss 10/23/2021  ? Underinsured 10/23/2021  ? Oral candidiasis 10/23/2021  ? Lobar pneumonia, unspecified organism (HCC) 10/23/2021  ? AIDS (acquired immune deficiency syndrome) (HCC) 10/23/2021  ? Normocytic anemia 10/23/2021  ? Elevated transaminase level 10/23/2021  ? HIV (human immunodeficiency virus infection) (HCC) 01/14/2017  ? Vaccine counseling 01/14/2017  ? ?PCP:  Patient, No Pcp Per (Inactive) ?Pharmacy:   ?[facility] [phone] - [place], [place] - [phone] AT [facility] [address] ?[address] ?[place] [address] ?Phone: [phone] Fax: [phone] ? ? ? ? ?Social Determinants of Health (SDOH) Interventions ?  ? ?Readmission Risk Interventions ?   ? View : No data to display.  ?  ?  ?  ? ? ? ?

## 2021-10-24 NOTE — Progress Notes (Addendum)
? ?Subjective: ? ?Overnight Events: No acute events or concerns overnight. ? ?The patient states that he is feeling much better than he has over the past few days. He states that he was able to eat breakfast and has no complaints at this time.  ? ?Objective: ? ?Vital signs in last 24 hours: ?Vitals:  ? 10/23/21 2145 10/23/21 2227 10/23/21 2341 10/24/21 0448  ?BP: 102/70  113/87 101/71  ?Pulse: (!) 104  91 77  ?Resp: 19  18 18   ?Temp:  98.9 ?F (37.2 ?C) (!) 97.5 ?F (36.4 ?C) 98.5 ?F (36.9 ?C)  ?TempSrc:  Oral Oral Oral  ?SpO2: 96%  96% 96%  ?Weight:      ?Height:      ? ?Weight change:  ? ?Intake/Output Summary (Last 24 hours) at 10/24/2021 0735 ?Last data filed at 10/23/2021 2007 ?Gross per 24 hour  ?Intake 1200 ml  ?Output --  ?Net 1200 ml  ? ? ?Physical Exam ?  ?Constitutional: thin, chronically ill-appearing and sitting in bed, in no acute distress ?HENT: normocephalic atraumatic, mucous membranes moist ?Eyes: pupils equal and round, conjunctiva non-erythematous ?Cardiovascular: regular rate with normal rhythm, no m/r/g ?Pulmonary/Chest: normal work of breathing on room air, lungs clear to auscultation bilaterally ?Abdominal: soft, non-tender, non-distended, bowel sounds present ?MSK: normal bulk and tone ?Skin: warm and dry. ?Neurological: alert and answering questions appropriately. ?Psych: appropriate mood and affect ? ?Assessment/Plan: ? ?Principal Problem: ?  AIDS (acquired immune deficiency syndrome) (HCC) ?Active Problems: ?  Weight loss ?  Oral candidiasis ?  Lobar pneumonia, unspecified organism (HCC) ?  Normocytic anemia ?  Elevated transaminase level ? ?Jason Herman is a 36 year old male with past medical history of HIV not on treatment, AIDS, who was sent from ID clinic to the hospital for inpatient management of his pneumonia and oral candidiasis. ?  ?CAP ?Parainfluenza viral pneumonitis ?Overall, the pt most likely has CAP following viral illness (resp panel positive for parainfluenza here)- most  recently he has remained afebrile and has clinically improved since initially started on abx. Could also consider PCP pneumonia as a diagnosis given his low CD4 count, elevated LDH, and ground-glass opacities on imaging though ID has lower concern for this, though could consider further workup for this if the pt does not improve in the next 2-3 days. Lower suspicion for active TB or endemic fungi though will also work these up as below. ?-Infectious Disease on board, appreciate their recommendations ?-Continue IV ceftriaxone and po azithromycin ?-TB QuantiFERON, serum fungitell pending ?-Blood cultures pending ? ?AIDS ?HIV ?Patient with longstanding HIV not adherent to medications due to poor understanding of his conditions and poor social support. Has not been on medication for 5 years but saw ID yesterday and was recommended to restart Biktarvy. Most recent CD4 count of 36 today. ?-Infectious Disease is on board, appreciate their recommendations ?-Continue Biktarvy ?-HIV viral load pending ?  ?Oral esophageal candidiasis ?White plaques on the tongue which are easily scraped off.  Nystatin suspension was prescribed after his previous ED visit but appears that he never got the medication. The patient is on IV fluconazole. Will switch to oral fluconazole given that he tolerated PO this morning with breakfast. ?-Continue fluconazole 200 mg po daily for 3 weeks until 05/29. ?-If fails to improve on this medication, can consider EGD for further workup, consider other pathogens such as CMV or EBV ? ?Low BMI ?Protein caloric malnutrition ?Patient with poor diet and chronic malnutrition. Magnesium (2.2) and phosphorous (3.4) are within  normal limits though albumin is low at 2.3.  ?-Referral to RD ?-Multivitamins ?  ?History of leukopenia, now within normal limit ?Normocytic anemia ?CBC with differential showed smudge cells. Questionable CLL but possible due to his immunocompromised state. No obvious lymphadenopathy  palpated on exam or on recent CT chest. Patient's reticulocyte count is 1.9% but corrected for low hematocrit, absolute reticulocytes are 1.3% with index of 0.85. 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   ?-Trend CBC ?-Obtain peripheral blood smear. If confirmed smudge cells or blasts, consider obtaining flow cytometry. ?  ?Substance use ?Poor social support ?UDS positive for tetrahydrocannabinol only. Per review of records, patient has been without stable housing for several years and has spent time incarcerated but is now working with family caregivers including a cousin. ?-TOC consult ? ?Diet: Full ?VTE: Lovenox ?IVF: None ?Code: Full ? ?Prior to Admission Living Arrangement: Unhomed ?Anticipated Discharge Location: TBD ?Barriers to Discharge: Active infection in the setting of HIV ?Dispo: Anticipated discharge in approximately 2-3 day(s).  ? ? LOS: 1 day  ? ?[name], Medical Student ?10/24/2021, 7:35 AM  ? ? ?Attestation for Student Documentation: ? ?I personally was present and performed or re-performed the history, physical exam and medical decision-making activities of this service and have verified that the service and findings are accurately documented in the student?s note. ? ?Andrey Campanile, MD ?10/24/2021, 2:34 PM ? ?

## 2021-10-25 ENCOUNTER — Other Ambulatory Visit (HOSPITAL_COMMUNITY): Payer: Self-pay

## 2021-10-25 ENCOUNTER — Telehealth: Payer: Self-pay

## 2021-10-25 DIAGNOSIS — E43 Unspecified severe protein-calorie malnutrition: Secondary | ICD-10-CM | POA: Insufficient documentation

## 2021-10-25 LAB — HIV-1 RNA QUANT-NO REFLEX-BLD
HIV 1 RNA Quant: 119000 copies/mL
LOG10 HIV-1 RNA: 5.076 log10copy/mL

## 2021-10-25 LAB — PATHOLOGIST SMEAR REVIEW

## 2021-10-25 LAB — CBC
HCT: 23.6 % — ABNORMAL LOW (ref 39.0–52.0)
Hemoglobin: 7.9 g/dL — ABNORMAL LOW (ref 13.0–17.0)
MCH: 32.1 pg (ref 26.0–34.0)
MCHC: 33.5 g/dL (ref 30.0–36.0)
MCV: 95.9 fL (ref 80.0–100.0)
Platelets: 132 10*3/uL — ABNORMAL LOW (ref 150–400)
RBC: 2.46 MIL/uL — ABNORMAL LOW (ref 4.22–5.81)
RDW: 13.2 % (ref 11.5–15.5)
WBC: 2.5 10*3/uL — ABNORMAL LOW (ref 4.0–10.5)
nRBC: 0 % (ref 0.0–0.2)

## 2021-10-25 LAB — COMPREHENSIVE METABOLIC PANEL
ALT: 28 U/L (ref 0–44)
AST: 51 U/L — ABNORMAL HIGH (ref 15–41)
Albumin: 2 g/dL — ABNORMAL LOW (ref 3.5–5.0)
Alkaline Phosphatase: 48 U/L (ref 38–126)
Anion gap: 6 (ref 5–15)
BUN: 11 mg/dL (ref 6–20)
CO2: 24 mmol/L (ref 22–32)
Calcium: 8.4 mg/dL — ABNORMAL LOW (ref 8.9–10.3)
Chloride: 103 mmol/L (ref 98–111)
Creatinine, Ser: 0.74 mg/dL (ref 0.61–1.24)
GFR, Estimated: >60 mL/min (ref >60)
Glucose, Bld: 101 mg/dL — ABNORMAL HIGH (ref 70–99)
Potassium: 3.6 mmol/L (ref 3.5–5.1)
Sodium: 133 mmol/L — ABNORMAL LOW (ref 135–145)
Total Bilirubin: 0.4 mg/dL (ref 0.3–1.2)
Total Protein: 6.1 g/dL — ABNORMAL LOW (ref 6.5–8.1)

## 2021-10-25 LAB — URINE CULTURE

## 2021-10-25 MED ORDER — CEFDINIR 300 MG PO CAPS
300.0000 mg | ORAL_CAPSULE | Freq: Two times a day (BID) | ORAL | Status: DC
  Administered 2021-10-25 – 2021-10-26 (×3): 300 mg via ORAL
  Filled 2021-10-25 (×5): qty 1

## 2021-10-25 MED ORDER — RIVAROXABAN 10 MG PO TABS
10.0000 mg | ORAL_TABLET | Freq: Every day | ORAL | Status: DC
  Administered 2021-10-25 – 2021-10-26 (×2): 10 mg via ORAL
  Filled 2021-10-25 (×2): qty 1

## 2021-10-25 NOTE — Telephone Encounter (Signed)
Called Jason Herman back on phone number - I answered all her questions to her satisfaction.  ?She has seen a positive change during hospitalization and thankful Jason Herman is improving.  ? ?I let her know that I would like to arrange follow up in the clinic sometime 2 weeks after his discharge (not quite ready for that yet but nearing so from what I hear) with either myself or Dr. Gale Journey.  ?
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
## 2021-10-25 NOTE — Progress Notes (Addendum)
? ?Subjective: ? ?Overnight Events: No acute events or concerns overnight. ? ?The patient states that he is feeling well today. He denies shortness of breath, chest pain, sore throat. Patient is a poor historian but seems to suggest that his lovenox injections are bothering him. ? ?Objective: ? ?Vital signs in last 24 hours: ?Vitals:  ? 10/24/21 1500 10/24/21 1716 10/24/21 2147 10/25/21 0639  ?BP: 112/75 116/72 100/64 101/73  ?Pulse: 78 78 98 73  ?Resp: 18 18 18 20   ?Temp: 98.4 ?F (36.9 ?C) 98.8 ?F (37.1 ?C) 99.4 ?F (37.4 ?C) 98.4 ?F (36.9 ?C)  ?TempSrc: Oral Oral Oral Oral  ?SpO2: 98% 99% 95% 98%  ?Weight:      ?Height:      ? ?Weight change:  ? ?Intake/Output Summary (Last 24 hours) at 10/25/2021 0733 ?Last data filed at 10/25/2021 0600 ?Gross per 24 hour  ?Intake 1959.22 ml  ?Output 750 ml  ?Net 1209.22 ml  ? ? ? ?Physical Exam ?  ?Constitutional: thin, chronically ill-appearing and sitting in bed, in no acute distress ?HENT: normocephalic atraumatic, mucous membranes moist ?Eyes: pupils equal and round, conjunctiva non-erythematous ?Cardiovascular: regular rate with normal rhythm, no m/r/g ?Pulmonary/Chest: normal work of breathing on room air, lungs clear to auscultation bilaterally ?Abdominal: soft, non-tender, non-distended, bowel sounds present ?MSK: mildly decreased bulk and tone ?Skin: warm and dry. ?Neurological: alert and answering questions ?Psych: Appropriate mood and affect. Tangential speech, difficult to redirect.  ? ?Assessment/Plan: ? ?Principal Problem: ?  AIDS (acquired immune deficiency syndrome) (HCC) ?Active Problems: ?  Weight loss ?  Oral candidiasis ?  Lobar pneumonia, unspecified organism (HCC) ?  Normocytic anemia ?  Elevated transaminase level ?  Community acquired pneumonia of right lung ?  Sepsis with acute organ dysfunction (HCC) ? ?Jason Herman is a 36 year old male with past medical history of HIV not on treatment, AIDS, who was sent from ID clinic to the hospital for inpatient  management of his pneumonia and oral candidiasis, who is currently improving on abx for CAP. ?  ?CAP ?Parainfluenza viral pneumonitis ?Overall, the pt most likely has CAP following viral illness (resp panel positive for parainfluenza here)- most recently he has remained afebrile and has clinically improved since initially started on abx. Could also consider PCP pneumonia as a diagnosis given his low CD4 count, elevated LDH, and ground-glass opacities on imaging though ID has lower concern for this, though could consider further workup for this if the pt does not improve in the next couple of days. He does seem to be improving on antibiotics, will hold off on further PCP workup at this time. Lower suspicion for active TB or endemic fungi though will also work these up as below. ?-Infectious Disease on board, appreciate their recommendations ?-Continue IV ceftriaxone and po azithromycin ?-TB QuantiFERON, serum fungitell pending ?-Blood cultures show NGTD ? ?AIDS ?HIV ?Patient with longstanding HIV not adherent to medications due to poor understanding of his conditions and poor social support. Has not been on medication for 5 years but saw ID recently and was recommended to restart Biktarvy. Most recent CD4 count of 36 here in the hospital. ?-Infectious Disease is on board, appreciate their recommendations ?-Continue Biktarvy ?-HIV viral load pending ?  ?Oral esophageal candidiasis ?White plaques on the tongue which are easily scraped off.  Nystatin suspension was prescribed after his previous ED visit but appears that he never got the medication. The patient is on oral fluconazole. ?-Continue fluconazole 200 mg po daily for 3 weeks until  05/29. ?-If fails to improve on this medication, can consider EGD for further workup, consider other pathogens such as CMV or EBV ? ?Low BMI ?Protein caloric malnutrition ?Patient with poor diet and chronic malnutrition. Magnesium (2.2) and phosphorous (3.4) are within normal limits  though albumin is low at 2.3.  ?-Referral to RD ?-Multivitamins ?  ?History of leukopenia, now within normal limit ?Normocytic anemia ?Initial CBC with differential showed smudge cells. Questionable CLL but possible due to his immunocompromised state. No obvious lymphadenopathy palpated on exam or on recent CT chest. Patient's reticulocyte count is 1.9% but corrected for low hematocrit, absolute reticulocytes are 1.3% with index of 0.85. 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 Hemoglobin today is 7.9 down from 9.5 yesterday which could be dilutional with fluids here in the hospital. No obvious source of active bleeding.  ?-Trend CBC ?-Pathologist smear review pending. If confirmed smudge cells or blasts, consider obtaining flow cytometry. ? ?Substance use ?Poor social support ?UDS positive for tetrahydrocannabinol only. Per review of records, patient has been without stable housing for several years and has spent time incarcerated but is now working with family caregivers including a cousin. TOC has evaluated the patient and states that he is currently living at home with a friend and has some family support as well. They have a follow-up scheduled for him at [facility] at [facility], and he will need transportation there at discharge.  ?-TOC following, appreciate recommendations ? ?Diet: Full ?VTE: Xarelto (note: no need to extend xarelto after his discharge) ?IVF: None ?Code: Full ? ?Prior to Admission Living Arrangement: Living with a friend ?Anticipated Discharge Location: TBD, likely back to friend's home ?Barriers to Discharge: Medical workup and stability ?Dispo: Anticipated discharge in approximately 1-2 day(s).  ? ? LOS: 2 days  ? ?Jason Herman, Medical Student ?10/25/2021, 7:33 AM  ? ? ?Attestation for Student Documentation: ? ?I personally was present and performed or re-performed the history,  physical exam and medical decision-making activities of this service and have verified that the service and findings are accurately documented in the student?s note. ? ?Andrey Campanile, MD ?10/25/2021, 12:55 PM ? ?

## 2021-10-25 NOTE — Telephone Encounter (Signed)
Patient's cousin Barnett Applebaum called and stated she spoke with provider on Monday during patient's visit; states she has additional follow up questions regarding results and requests call back. Phone number [phone]. Stated she would also reach out directly via MyChart. ? ?Wyvonne Lenz, RN  ? ? ?

## 2021-10-25 NOTE — TOC Benefit Eligibility Note (Signed)
Patient Advocate Encounter ? ?Completed and sent [employer] Advancing Access application for BIKTARVY for this patient who is uninsured.   ? ?Patient is approved 10/20/2021 through 10/20/2022. ? ?BIN      [id number] ?PCN    [id number] ?GRP    [id number] ?ID        [id number] ? ? ? ?

## 2021-10-25 NOTE — Telephone Encounter (Signed)
Routed to provider

## 2021-10-26 ENCOUNTER — Other Ambulatory Visit (HOSPITAL_COMMUNITY): Payer: Self-pay

## 2021-10-26 ENCOUNTER — Ambulatory Visit (HOSPITAL_COMMUNITY)
Admission: EM | Admit: 2021-10-26 | Discharge: 2021-10-26 | Disposition: A | Discharge status: Home / Self Care | Payer: No Payment, Other | Attending: Psychiatry | Admitting: Psychiatry

## 2021-10-26 DIAGNOSIS — E43 Unspecified severe protein-calorie malnutrition: Secondary | ICD-10-CM

## 2021-10-26 DIAGNOSIS — F22 Delusional disorders: Secondary | ICD-10-CM | POA: Insufficient documentation

## 2021-10-26 DIAGNOSIS — F419 Anxiety disorder, unspecified: Secondary | ICD-10-CM | POA: Diagnosis not present

## 2021-10-26 DIAGNOSIS — R4589 Other symptoms and signs involving emotional state: Secondary | ICD-10-CM

## 2021-10-26 DIAGNOSIS — J181 Lobar pneumonia, unspecified organism: Secondary | ICD-10-CM

## 2021-10-26 LAB — HIV-1/2 AB - DIFFERENTIATION
HIV 1 Ab: REACTIVE
HIV 2 Ab: NONREACTIVE

## 2021-10-26 LAB — FUNGITELL, SERUM: Fungitell Result: 480 pg/mL — ABNORMAL HIGH (ref <80)

## 2021-10-26 MED ORDER — FLUCONAZOLE 200 MG PO TABS
200.0000 mg | ORAL_TABLET | Freq: Every day | ORAL | 0 refills | Status: DC
  Filled 2021-10-26: qty 17, 17d supply, fill #0

## 2021-10-26 MED ORDER — CEFDINIR 300 MG PO CAPS
300.0000 mg | ORAL_CAPSULE | Freq: Two times a day (BID) | ORAL | 0 refills | Status: AC
  Filled 2021-10-26: qty 3, 2d supply, fill #0

## 2021-10-26 MED ORDER — BICTEGRAVIR-EMTRICITAB-TENOFOV 50-200-25 MG PO TABS
1.0000 | ORAL_TABLET | Freq: Every day | ORAL | 11 refills | Status: DC
  Filled 2021-10-26: qty 30, 30d supply, fill #0

## 2021-10-26 MED ORDER — AZITHROMYCIN 500 MG PO TABS
500.0000 mg | ORAL_TABLET | Freq: Every day | ORAL | 0 refills | Status: AC
Start: 2021-10-27 — End: 2021-10-28
  Filled 2021-10-26: qty 1, 1d supply, fill #0

## 2021-10-26 NOTE — ED Provider Notes (Signed)
[facility] Medical Screening Exam ? ?Patient Name: Jason Herman ?MRN: [id number] ?Date of Evaluation: 10/26/21 ?Chief Complaint:   ?Diagnosis:  ?Final diagnoses:  ?Paranoia (psychosis) (HCC)  ?Anxious appearance  ? ? ?History of Present illness: Jason Herman is a 36 y.o. male. Present to [facility] by [employer], for strange acting behavior,  according to report pt has to be redirected multiple time,  going in and out of the car.  Pt is not a good historian.  ? ?Copied from TTS, pt was at family home and was picked up tonight due to bizarre behaviors.They also state that he had to be redirected numerous times to get inside of the car. Pt was observed staring, pacing and appears to be paranoid. Pt appeared to be speaking to a friend named Pernell and attempted to walk out of the assessment room. Pt is poor historian,due to current condition. Pt does deny SI, HI and substance/alchohol use. Pt is emergent. ? ?Observation of patient,  he is alert and orient to person.  Speech clear with unorganized speech.  Mood confused affect flat.  Pt has a history of substance abuse. Pt denies SI,  HI,  pt seem to be paranoid, TTS was able to inform patient caregiver that pt will be returning home, at this time he doesn't not meet criteria for admission and therefore will be discharge home ? ?Recommend discharge home with family.   ? ?Psychiatric Specialty Exam ? ?Presentation  ?General Appearance:Bizarre ? ?Eye Contact:Fair ? ?Speech:Clear and Coherent ? ?Speech Volume:Normal ? ?Handedness:Ambidextrous ? ? ?Mood and Affect  ?Mood:Anxious; Euphoric ? ?Affect:Constricted ? ? ?Thought Process  ?Thought Processes:Disorganized ? ?Descriptions of Associations:Circumstantial ? ?Orientation:Full (Time, Place and Person) ? ?Thought Content:Illogical ? Diagnosis of Schizophrenia or Schizoaffective disorder in past: No ? Duration of Psychotic Symptoms: Less than six months (UTA--acute presenting symptoms) ?  Hallucinations:Auditory ? ?Ideas of Reference:Delusions; Paranoia ? ?Suicidal Thoughts:No ? ?Homicidal Thoughts:No ? ? ?Sensorium  ?Memory:Immediate Poor ? ?Judgment:Poor ? ?Insight:Poor ? ? ?Executive Functions  ?Concentration:Poor ? ?Attention Span:Poor ? ?Recall:Poor ? ?Fund of Knowledge:Fair ? ?Language:Fair ? ? ?Psychomotor Activity  ?Psychomotor Activity:Normal ? ? ?Assets  ?Assets:No data recorded ? ?Sleep  ?Sleep:No data recorded ?Number of hours: No data recorded ? ?Nutritional Assessment (For OBS and FBC admissions only) ?Has the patient had a weight loss or gain of 10 pounds or more in the last 3 months?: Yes ?Has the patient had a decrease in food intake/or appetite?: Yes ?Does the patient have dental problems?: No ?Does the patient have eating habits or behaviors that may be indicators of an eating disorder including binging or inducing vomiting?: No ?Has the patient recently lost weight without trying?: 0 ?Has the patient been eating poorly because of a decreased appetite?: 0 ?Malnutrition Screening Tool Score: 0 ? ? ? ?Physical Exam: ?Physical Exam ?HENT:  ?   Head: Normocephalic.  ?   Nose: Nose normal.  ?Cardiovascular:  ?   Rate and Rhythm: Normal rate.  ?Pulmonary:  ?   Effort: Pulmonary effort is normal.  ?Musculoskeletal:     ?   General: Normal range of motion.  ?   Cervical back: Normal range of motion.  ?Skin: ?   General: Skin is warm.  ?Neurological:  ?   General: No focal deficit present.  ?   Mental Status: He is alert.  ?Psychiatric:     ?   Behavior: Behavior normal.  ? ?Review of Systems  ?Constitutional: Negative.   ?HENT: Negative.    ?  Eyes: Negative.   ?Respiratory: Negative.    ?Cardiovascular: Negative.   ?Gastrointestinal: Negative.   ?Genitourinary: Negative.   ?Musculoskeletal: Negative.   ?Skin: Negative.   ?Neurological: Negative.   ?Endo/Heme/Allergies: Negative.   ?Psychiatric/Behavioral:  Positive for hallucinations. The patient is nervous/anxious.   ?Blood pressure  109/88, pulse (!) 108, temperature 98.2 ?F (36.8 ?C), temperature source Oral, resp. rate 20, SpO2 100 %. There is no height or weight on file to calculate BMI. ? ?Musculoskeletal: ?Strength & Muscle Tone: within normal limits ?Gait & Station: normal ?Patient leans: N/A ? ? ?[facility] Discharge Disposition for Follow up and Recommendations: ?Based on my evaluation the patient does not appear to have an emergency medical condition and can be discharged with resources and follow up care in outpatient services for Medication Management ? ? ?Sindy Guadeloupe, NP ?10/26/2021, 11:10 PM ? ?

## 2021-10-26 NOTE — Discharge Summary (Addendum)
? ?Name: Jason Herman ?MRN: [id number] ?DOB: 28-Oct-1985 36 y.o. ?PCP: Patient, No Pcp Per (Inactive) ? ?Date of Admission: 10/23/2021  4:36 PM ?Date of Discharge: 10/26/21 ?Attending Physician: Gust RungHoffman, Erik C, DO ? ?Discharge Diagnosis: ?Community acquired pneumonia, parainfluenza viral pneumonitis ?HIV/AIDS ?Oral esophageal candidiasis ?Protein caloric malnutrition ?Normocytic anemia ?Substance use ?Underweight with BMI of 15.85 ? ?Discharge Medications: ?Allergies as of 10/26/2021   ?No Known Allergies ?  ? ?  ?Medication List  ?  ? ?STOP taking these medications   ? ?amoxicillin 500 MG capsule ?Commonly known as: AMOXIL ?  ?BC HEADACHE PO ?  ?doxycycline 100 MG capsule ?Commonly known as: VIBRAMYCIN ?  ?neomycin-bacitracin-polymyxin ointment ?Commonly known as: NEOSPORIN ?  ?nystatin 100000 UNIT/ML suspension ?Commonly known as: MYCOSTATIN ?  ? ?  ? ?TAKE these medications   ? ?azithromycin 500 MG tablet ?Commonly known as: ZITHROMAX ?Take 1 tablet (500 mg total) by mouth daily for 1 dose. ?Start taking on: Oct 27, 2021 ?  ?bictegravir-emtricitabine-tenofovir AF 50-200-25 MG Tabs tablet ?Commonly known as: BIKTARVY ?Take 1 tablet by mouth daily. ?Start taking on: Oct 27, 2021 ?  ?cefdinir 300 MG capsule ?Commonly known as: OMNICEF ?Take 1 capsule (300 mg total) by mouth every 12 (twelve) hours for 3 doses. ?  ?DRY EYES OP ?Apply 1 drop to eye daily as needed (dry eyes). ?  ?fluconazole 200 MG tablet ?Commonly known as: DIFLUCAN ?Take 1 tablet (200 mg total) by mouth daily for 17 days. ?Start taking on: Oct 27, 2021 ?  ?senna-docusate 8.6-50 MG tablet ?Commonly known as: Senokot-S ?Take 1 tablet by mouth at bedtime as needed for mild constipation or moderate constipation. ?  ? ?  ? ? ?Disposition and follow-up:   ?Jason Herman [facility].  At the hospital follow up visit please address: ? ?CAP: We will finish 1 more dose of azithromycin and 3 more doses  of cefdinir. Reevaluate shortness of breath, cough. ? ?Oral esophageal candidiasis: Assess for symptoms of sore throat ? ?2.  Labs / imaging needed at time of follow-up: None ? ?3.  Pending labs/ test needing follow-up: Fungitell, QuantiFERON TB, HIV 1/2 differentiation ? ?Follow-up Appointments: ? ? ?Hospital Course by problem list: ?Community acquired pneumonia, parainfluenza viral pneumonitis ?The patient presented to the ED for evaluation of productive cough, shortness of breath, and fever. His CXR showed right upper lobe pneumonia. CT chest showed extensive ground-glass airspace opacities and cystic/cavitary airspace disease throughout both lungs, most pronounced in the right upper lobe. Overall, the pt most likely has CAP following viral illness as his respiratory panel was positive for parainfluenza here. PCP pneumonia was also considered given his low CD4 count, elevated LDH, and ground-glass opacities on imaging though ID saw the patient and  had lower concern for this. They recommended consider further workup for PCP if the pt did not improve on antibiotics. He does seem to be improving on antibiotics, so we held off on further PCP workup. Lower suspicion for active TB or endemic fungi given patient's improvement on ceftriaxone and azithromycin. Plan to discharge the patient on Azithromycin (1 more dose) and cefdinir (3 more doses).  ? ?HIV/AIDS ?Patient with longstanding HIV not adherent to medications due to poor understanding of his conditions and poor social support. Has not been on medication for 5 years but saw ID recently and was recommended to restart Biktarvy. Most recent CD4 count of 36 here in the hospital with viral load  119k. He was seen in the ID clinic immediately before presenting to the ED, and they state that they will follow-up with him as an outpatient on 5/19. He was started on Biktarvy at his previous clinic visit, we will continue this as an outpatient.  ? ?Oral esophageal  candidiasis ?Patient with longstanding HIV not adherent to medications due to poor understanding of his conditions and poor social support. Has not been on medication for 5 years but saw ID recently and was recommended to restart Biktarvy. Most recent CD4 count of 36 here in the hospital with viral load 119k. He was complaining of sore throat on admission and was found to have oral thrush that could easily be scraped off. He was seen for this 2 days prior to admission during an ED visit and prescribed oral nystatin, but it is unclear whether he failed to take this or whether he took it and it did not help. He was started on IV fluconazole given concern for his ability to tolerate PO, but has been switched to oral fluconazole, which he will continue as an outpatient (will finish this regimen on 5/22). ? ?Protein caloric malnutrition ?Patient with poor diet and chronic malnutrition. Magnesium (2.2) and phosphorous (3.4) are within normal limits though albumin is low at 2.3. RD was consulted and started the patient on a multivitamin. ? ?Normocytic anemia ?Patient with normocytic anemia with hemoglobin 10.1 on admission. Initial CBC with differential showed smudge cells. Questionable CLL but possible due to his immunocompromised state but the pathologist's smear review was unremarkable. No obvious lymphadenopathy palpated on exam or on recent CT chest. Patient's reticulocyte count is 1.9% but corrected for low hematocrit, absolute reticulocytes are 1.3% with index of 0.85. 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 Hemoglobin dropped to 7.9 during this admission which is likely dilutional after he was given fluids here in the hospital. No obvious source of active bleeding.  ? ?Substance use, poor social support ?UDS positive for tetrahydrocannabinol only. Per review of records, patient has been without stable housing for  several years and has spent time incarcerated but is now working with family caregivers including a cousin. TOC has evaluated the patient and states that he is currently living at home with a friend and has some family support as well. They have a follow-up scheduled for him at Primary [facility] at [facility], and he will need transportation there at discharge.  ? ?Discharge Exam:   ?BP 100/71 (BP Location: Right Arm)   Pulse 92   Temp 99.3 ?F (37.4 ?C) (Oral)   Resp 18   Ht 6' (1.829 m)   Wt 53 kg   SpO2 99%   BMI 15.85 kg/m?  ?Discharge exam:  ?Constitutional: thin, chronically ill-appearing and sitting in bed, in no acute distress ?HENT: normocephalic atraumatic, mucous membranes moist ?Eyes: pupils equal and round, conjunctiva non-erythematous ?Cardiovascular: regular rate with normal rhythm, no m/r/g ?Pulmonary/Chest: normal work of breathing on room air, lungs clear to auscultation bilaterally ?Abdominal: soft, non-tender, non-distended, bowel sounds present ?MSK: mildly decreased bulk and tone ?Skin: warm and dry. ?Neurological: alert and answering questions ?Psych: Appropriate mood and affect. Tangential speech, difficult to redirect.  ? ?Pertinent Labs, Studies, and Procedures:  ? ?DG Chest 1 View ? ?Result Date: 10/23/2021 ?CLINICAL DATA:  Cough, fever EXAM: CHEST  1 VIEW COMPARISON:  10/21/2021 FINDINGS: The heart size and mediastinal contours are within normal limits. Increasing  airspace opacity within the right upper lobe. Previously seen right lower lobe opacity has slightly improved. No pleural effusion or pneumothorax. The visualized skeletal structures are unremarkable. IMPRESSION: Worsening right upper lobe pneumonia. Previously seen right lower lobe opacity has slightly improved. Electronically Signed   By: Duanne Guess D.O.   On: 10/23/2021 17:35  ? ?CT CHEST W CONTRAST ? ?Result Date: 10/23/2021 ?CLINICAL DATA:  Pneumonia, complication suspected, xray done.  HIV EXAM: CT CHEST WITH  CONTRAST TECHNIQUE: Multidetector CT imaging of the chest was performed during intravenous contrast administration. RADIATION DOSE REDUCTION: This exam was performed according to the departmental dose-optimization program which

## 2021-10-26 NOTE — Discharge Instructions (Addendum)
You were hospitalized for pneumonia. Thank you for allowing [place] to be part of your care.  ? ?We arranged for you to follow up at: [facility] with infectious disease.  ? ? ?Please note these changes made to your medications:  ? ?Please START taking: Azithromycin 500 mg daily for one more dose. ?                                    Fluconazole 200 mg daily until 5/22. ?                                    Cefdinir 300 mg two times for 1.5 more days. ? ? ?Please make sure to continue taking your Biktarvy. This is very important to help prevent more infections. ? ?Please call our clinic if you have any questions or concerns, we may be able to help and keep you from a long and expensive emergency room wait. Our clinic and after hours phone number is [phone], the best time to call is Monday through Friday 9 am to 4 pm but there is always someone available 24/7 if you have an emergency. If you need medication refills please notify your pharmacy one week in advance and they will send [place] a request.   ?

## 2021-10-26 NOTE — TOC Transition Note (Signed)
Transition of Care (TOC) - CM/SW Discharge Note ? ? ?Patient Details  ?Name: Jason Herman ?MRN: [id number] ?Date of Birth: 06-Dec-1985 ? ?Transition of Care (TOC) CM/SW Contact:  ?Tom-Johnson, Hershal Coria, RN ?Phone Number: ?10/26/2021, 4:28 PM ? ? ?Clinical Narrative:    ? ?Patient is scheduled for discharge today. MATCH done for prescription meds and hosp f/u scheduled, Info on AVS. No PT/OT recommendations noted. Friend to transport at discharge. No further TOC needs noted. ? ?Final next level of care: Home/Self Care ?Barriers to Discharge: Barriers Resolved ? ? ?Patient Goals and CMS Choice ?Patient states their goals for this hospitalization and ongoing recovery are:: To return home ?CMS Medicare.gov Compare Post Acute Care list provided to:: Patient ?Choice offered to / list presented to : Patient ? ?Discharge Placement ?  ?           ?  ?Patient to be transferred to facility by: Friend ?  ?  ? ?Discharge Plan and Services ?In-house Referral: Financial Counselor ?Discharge Planning Services: CM Consult, Follow-up appt scheduled, MATCH Program ?Post Acute Care Choice: NA          ?DME Arranged: N/A ?DME Agency: NA ?  ?  ?  ?HH Arranged: NA ?HH Agency: NA ?  ?  ?  ? ?Social Determinants of Health (SDOH) Interventions ?  ? ? ?Readmission Risk Interventions ?   ? View : No data to display.  ?  ?  ?  ? ? ? ? ? ?

## 2021-10-26 NOTE — Progress Notes (Signed)
Notified by lab personnel that patient refused blood drawn, educated patient, still refused. IM MD August Saucer made aware. ?

## 2021-10-26 NOTE — BH Assessment (Addendum)
Comprehensive Clinical Assessment (CCA) Note ? ?10/26/2021 ?Neale Burly ?[id number] ? ?Chief Complaint:  ?Chief Complaint  ?Patient presents with  ? Hallucinations  ? Paranoid  ? ?Visit Diagnosis:  ?Paranoid behavior ? ? ?Disposition: ?Per Evette Georges NP  pt does not meet inpatient criteria and can be discharged with outpatient resources. ? ?Contacted pts cousin: Zigmund Daniel Day [phone] and she stated that she would pick pt up in 45 minutes. ? ?[place] ED from 10/26/2021 in [facility] ED to Hosp-Admission (Discharged) from 10/23/2021 in [place] ED from 10/02/2021 in [place]  ?C-SSRS RISK CATEGORY No Risk No Risk No Risk  ? ?  ? ? ? ? ?CCA Screening, Triage and Referral (STR) ? ?Patient Reported Information ?How did you hear about [place]? Legal System ? ?What Is the Reason for Your Visit/Call Today? Pt reports to [facility], voluntarily accompanied by [employer]. Per [employer],  pt was at family home and was picked up tonight due to bizarre behaviors.They also state that he had to be redirected numerous times to get inside of the car. Pt was observed staring, pacing and appears to be paranoid. Pt appeared to be speaking to a friend named Pernell and attempted  to walk out of the assessment room. Pt is poor historian,due to current condition. Pt does deny SI, HI and substance/alchohol use. Pt is emergent. ? ?How Long Has This Been Causing You Problems? -- Pincus Badder) ? ?What Do You Feel Would Help You the Most Today? Treatment for Depression or other mood problem ? ? ?Have You Recently Had Any Thoughts About Hurting Yourself? No ? ?Are You Planning to Commit Suicide/Harm Yourself At This time? No ? ? ?Have you Recently Had Thoughts About [place]? No ? ?Are You Planning to Harm Someone at This Time? No ? ?Explanation: No data recorded ? ?Have You Used Any Alcohol or Drugs in the Past 24 Hours? No ? ?How Long Ago Did You Use Drugs or Alcohol?  No data recorded ?What Did You Use and How Much? No data recorded ? ?Do You Currently Have a Therapist/Psychiatrist? No ? ?Name of Therapist/Psychiatrist: No data recorded ? ?Have You Been Recently Discharged From Any Office Practice or Programs? No ? ?Explanation of Discharge From Practice/Program: No data recorded ? ?  ?CCA Screening Triage Referral Assessment ?Type of Contact: Face-to-Face ? ?Telemedicine Service Delivery:   ?Is this Initial or Reassessment? No data recorded ?Date Telepsych consult ordered in CHL:  No data recorded ?Time Telepsych consult ordered in CHL:  No data recorded ?Location of Assessment: [facility] Assessment Services ? ?Provider Location: [facility] Assessment Services ? ? ?Collateral Involvement: none ? ? ?Does Patient Have a Stage manager Guardian? No data recorded ?Name and Contact of Legal Guardian: No data recorded ?If Minor and Not Living with Parent(s), Who has Custody? No data recorded ?Is CPS involved or ever been involved? Never ? ?Is APS involved or ever been involved? Never ? ? ?Patient Determined To Be At Risk for Harm To Self or Others Based on Review of Patient Reported Information or Presenting Complaint? No ? ?Method: No data recorded ?Availability of Means: No data recorded ?Intent: No data recorded ?Notification Required: No data recorded ?Additional Information for Danger to Others Potential: No data recorded ?Additional Comments for Danger to Others Potential: No data recorded ?Are There Guns or Other Weapons in [place]? No data recorded ?Types of Guns/Weapons: No data recorded ?Are These  Weapons Safely Secured?                            No data recorded ?Who Could Verify You Are Able To Have These Secured: No data recorded ?Do You Have any Outstanding Charges, Pending Court Dates, Parole/Probation? No data recorded ?Contacted To Inform of Risk of Harm To Self or Others: No data recorded ? ? ?Does Patient Present under Involuntary Commitment? No ? ?IVC Papers  Initial File Date: No data recorded ? ?[place] of Residence: Kathleen Argue ? ? ?Patient Currently Receiving the Following Services: Not Receiving Services ? ? ?Determination of Need: Emergent (2 hours) ? ? ?Options For Referral: Medication Management; Inpatient Hospitalization; [facility]; Outpatient Therapy ? ? ? ? ?CCA Biopsychosocial ?Patient Reported Schizophrenia/Schizoaffective Diagnosis in Past: No ? ? ?Strengths: No data recorded ? ?Mental Health Symptoms ?Depression:  Irritability ?  ?Duration of Depressive symptoms: Duration of Depressive Symptoms: N/A ?  ?Mania:  Irritability; Racing thoughts ?  ?Anxiety:   N/A ?  ?Psychosis:  Affective flattening/alogia/avolition; Delusions; Grossly disorganized or catatonic behavior; Grossly disorganized speech ?  ?Duration of Psychotic symptoms: Duration of Psychotic Symptoms: Less than six months (UTA--acute presenting symptoms) ?  ?Trauma:  N/A ?  ?Obsessions:  N/A ?  ?Compulsions:  N/A ?  ?Inattention:  N/A ?  ?Hyperactivity/Impulsivity:  N/A ?  ?Oppositional/Defiant Behaviors:  N/A ?  ?Emotional Irregularity:  N/A ?  ?Other Mood/Personality Symptoms:  No data recorded  ? ?Mental Status Exam ?Appearance and self-care  ?Stature:  Average ?  ?Weight:  Thin ?  ?Clothing:  Disheveled ?  ?Grooming:  Neglected ?  ?Cosmetic use:  None ?  ?Posture/gait:  Tense ?  ?Motor activity:  Restless ?  ?Sensorium  ?Attention:  Distractible ?  ?Concentration:  Focuses on irrelevancies; Preoccupied; Scattered ?  ?Orientation:  Person ?  ?Recall/memory:  Defective in Immediate; Defective in Short-term; Defective in Recent; Defective in Remote ?  ?Affect and Mood  ?Affect:  Appropriate (Pt is trying hard to engage throughout assessment) ?  ?Mood:  Anxious ?  ?Relating  ?Eye contact:  Fleeting ?  ?Facial expression:  Responsive; Tense ?  ?Attitude toward examiner:  Cooperative ?  ?Thought and Language  ?Speech flow: Blocked; Articulation error; Pressured; Flight of Ideas; Garbled; Slurred ?   ?Thought content:  Delusions ("I need to go and get a million dollars from my family") ?  ?Preoccupation:  Ruminations (delusions) ?  ?Hallucinations:  Other (Comment) (pt denies--unable to assess at time of assessment) ?  ?Organization:  No data recorded  ?Executive Functions  ?Fund of Knowledge:  Impoverished by (Comment) (current altered mental status) ?  ?Intelligence:  Needs investigation ?  ?Abstraction:  Overly abstract ?  ?Judgement:  Impaired ?  ?Reality Testing:  Variable ?  ?Insight:  Gaps; Poor; Unaware ?  ?Decision Making:  Paralyzed; Vacilates ?  ?Social Functioning  ?Social Maturity:  Irresponsible; Impulsive ?  ?Social Judgement:  Heedless; Impropriety ?  ?Stress  ?Stressors:  Family conflict ?  ?Coping Ability:  Deficient supports; Exhausted; Overwhelmed ?  ?Skill Deficits:  Self-control; Self-care; Interpersonal; Decision making ?  ?Supports:  Friends/Service system ?  ? ? ?Religion: ?Religion/Spirituality ?Are You A Religious Person?:  (UTA) ? ?Leisure/Recreation: ?Leisure / Recreation ?Do You Have Hobbies?:  (UTA) ? ?Exercise/Diet: ?Exercise/Diet ?Do You Exercise?: Yes ?Have You Gained or Lost A Significant Amount of Weight in the Past Six Months?: Yes-Lost ("a lot") ?Do You Follow  a Special Diet?: No ?Do You Have Any Trouble Sleeping?: No ?Explanation of Sleeping Difficulties: pt reports good quality and quantity of sleep ? ? ?CCA Employment/Education ?Employment/Work Situation: ?Employment / Work Situation ?Employment Situation:  ([place]) ?Patient's Job has Been Impacted by Current Illness:  (UTA) ?Has Patient ever Been in the Military?:  ([place]) ? ?Education: ?Education ?Is Patient Currently Attending School?:  ([place]) ?Last Grade Completed:  (UTA) ?Did You Attend College?:  ([place]) ?Did You Have An Individualized Education Program (IIEP):  (UTA) ?Did You Have Any Difficulty At School?:  (UTA) ?Patient's Education Has Been Impacted by Current Illness:  (UTA) ? ? ?CCA Family/Childhood History ?Family  and Relationship History: ?Family history ?Marital status:  (UTA) ?Does patient have children?: No ? ?Childhood History:  ?Childhood History ?By whom was/is the patient raised?:  (UTA) ?Did patient suffe

## 2021-10-26 NOTE — Progress Notes (Signed)
Patient discharge teaching given, including activity, diet, follow-up appoints, and medications. Patient verbalized understanding of all discharge instructions. IV access was d/c'd. Vitals are stable. Skin is intact except as charted in most recent assessments. Pt to be escorted out by NT, to be driven home by family.  ?Meds from pharmacy delivered to patient. ?

## 2021-10-26 NOTE — Progress Notes (Signed)
?   ? ? ? ? ?[facility] ? ?Date of Admission:  10/23/2021    ? ?Abx: ?5/10-c cefdinir ?5/9-10 ceftriaxone ?5/9-c azith ?5/8-c fluconazole ?  ?5/8-9 vanc/cefepime                                                    ?  ?  ?Assessment: ?36 yo male with aids here for pna symptomatology and oroesophageal candidiasis  ?  ?#cough/dyspnea -- CAP ?#parainfluenza viral pneumonitis ?Appears to behave like viral pna with possibly superimposed bacterial infection ?Improving with supportive care and CAP tx ? ?  ?#oral-esophageal candidiasis ?Improving on fluconazole treatment ? ?  ?#aids ?Recent Labs  ?     ?Lab Results  ?Component Value Date  ?  CD4TCELL 8 (L) 10/23/2021  ?  CD4TABS 36 (L) 10/23/2021  ?  ? ?Lab Results  ?Component Value Date  ? HIV1RNAQUANT 119,000 10/24/2021  ? ?5/09 genotype pending ?Dx'ed 2018; defer treatment due to drug use/lack of social support ?2018 genotype appear ordered but not run? Will repeat ?So far no obvious sign of other OI ?  ?Patient reports better social support. Agreeable to restart biktarvy ?  ?  ?#hcm ?Hepatitis b acute panel is negative ?  ? ? ?Plan: ?-finish 5 day cap tx on 5/13 azith/cefdinir ?-finish 2 weeks fluconazole tx for oroesophageal candidiasis on 5/22 ?-ID clinic visit 5/19 @ 2pm ?-biktarvy 1 month supply to be provided at discharge ?-further prophylaxis can be discussed in clinic ?-will sign off ?-discussed with primary team ? ? ?Principal Problem: ?  AIDS (acquired immune deficiency syndrome) (HCC) ?Active Problems: ?  Weight loss ?  Oral candidiasis ?  Lobar pneumonia, unspecified organism (HCC) ?  Normocytic anemia ?  Elevated transaminase level ?  Community acquired pneumonia of right lung ?  Sepsis with acute organ dysfunction (HCC) ?  Protein-calorie malnutrition, severe ? ? ?No Known Allergies ? ?Scheduled Meds: ? azithromycin  500 mg Oral Daily  ? bictegravir-emtricitabine-tenofovir AF  1 tablet Oral Daily  ? cefdinir  300 mg Oral Q12H  ?  feeding supplement  237 mL Oral TID BM  ? fluconazole  200 mg Oral Daily  ? multivitamin with minerals  1 tablet Oral QHS  ? rivaroxaban  10 mg Oral Daily  ? ?Continuous Infusions: ?PRN Meds:.acetaminophen **OR** acetaminophen, polyethylene glycol ? ? ?SUBJECTIVE: ?Afebrile ?No complaint ?Refused blood draw ? ? ?Review of Systems: ?ROS ?All other ROS was negative, except mentioned above ? ? ? ? ?OBJECTIVE: ?Vitals:  ? 10/25/21 1704 10/25/21 2132 10/26/21 0505 10/26/21 1215  ?BP: 108/79 98/68 101/74 100/71  ?Pulse: 93 98 97 92  ?Resp: 18 18 20 18   ?Temp: 98.3 ?F (36.8 ?C) 98.5 ?F (36.9 ?C) 98.3 ?F (36.8 ?C) 99.3 ?F (37.4 ?C)  ?TempSrc:  Oral Oral Oral  ?SpO2: 99% 97% 100% 99%  ?Weight:      ?Height:      ? ?Body mass index is 15.85 kg/m?. ? ?Physical Exam ? ?General/constitutional: no distress, pleasant ?HEENT: Normocephalic, PER, Conj Clear ?CV: rrr no mrg ?Lungs: normal respiratory effort ?Abd: Soft, Nontender ?Ext: no edema ?Skin: No Rash ?Neuro: nonfocal ? ? ?Lab Results ?Lab Results  ?Component Value Date  ? WBC 2.5 (L) 10/25/2021  ? HGB 7.9 (L) 10/25/2021  ? HCT 23.6 (L) 10/25/2021  ?  MCV 95.9 10/25/2021  ? PLT 132 (L) 10/25/2021  ?  ?Lab Results  ?Component Value Date  ? CREATININE 0.74 10/25/2021  ? BUN 11 10/25/2021  ? NA 133 (L) 10/25/2021  ? K 3.6 10/25/2021  ? CL 103 10/25/2021  ? CO2 24 10/25/2021  ?  ?Lab Results  ?Component Value Date  ? ALT 28 10/25/2021  ? AST 51 (H) 10/25/2021  ? ALKPHOS 48 10/25/2021  ? BILITOT 0.4 10/25/2021  ?  ? ? ?Microbiology: ?Recent Results (from the past 240 hour(s))  ?Resp Panel by RT-PCR (Flu A&B, Covid) Nasopharyngeal Swab     Status: None  ? Collection Time: 10/21/21  5:57 PM  ? Specimen: Nasopharyngeal Swab; Nasopharyngeal(NP) swabs in vial transport medium  ?Result Value Ref Range Status  ? SARS Coronavirus 2 by RT PCR NEGATIVE NEGATIVE Final  ?  Comment: (NOTE) ?SARS-CoV-2 target nucleic acids are NOT DETECTED. ? ?The SARS-CoV-2 RNA is generally detectable in upper  respiratory ?specimens during the acute phase of infection. The lowest ?concentration of SARS-CoV-2 viral copies this assay can detect is ?138 copies/mL. A negative result does not preclude SARS-Cov-2 ?infection and should not be used as the sole basis for treatment or ?other patient management decisions. A negative result may occur with  ?improper specimen collection/handling, submission of specimen other ?than nasopharyngeal swab, presence of viral mutation(s) within the ?areas targeted by this assay, and inadequate number of viral ?copies(<138 copies/mL). A negative result must be combined with ?clinical observations, patient history, and epidemiological ?information. The expected result is Negative. ? ?Fact Sheet for Patients:  ?BloggerCourse.comhttps://www.fda.gov/media/152166/download ? ?Fact Sheet for Healthcare Providers:  ?SeriousBroker.ithttps://www.fda.gov/media/152162/download ? ?This test is no t yet approved or cleared by the [place] FDA and  ?has been authorized for detection and/or diagnosis of SARS-CoV-2 by ?FDA under an Emergency Use Authorization (EUA). This EUA will remain  ?in effect (meaning this test can be used) for the duration of the ?COVID-19 declaration under Section 564(b)(1) of the Act, 21 ?U.S.C.section 360bbb-3(b)(1), unless the authorization is terminated  ?or revoked sooner.  ? ? ?  ? Influenza A by PCR NEGATIVE NEGATIVE Final  ? Influenza B by PCR NEGATIVE NEGATIVE Final  ?  Comment: (NOTE) ?The Xpert Xpress SARS-CoV-2/FLU/RSV plus assay is intended as an aid ?in the diagnosis of influenza from Nasopharyngeal swab specimens and ?should not be used as a sole basis for treatment. Nasal washings and ?aspirates are unacceptable for Xpert Xpress SARS-CoV-2/FLU/RSV ?testing. ? ?Fact Sheet for Patients: ?BloggerCourse.comhttps://www.fda.gov/media/152166/download ? ?Fact Sheet for Healthcare Providers: ?SeriousBroker.ithttps://www.fda.gov/media/152162/download ? ?This test is not yet approved or cleared by the [place] FDA and ?has been  authorized for detection and/or diagnosis of SARS-CoV-2 by ?FDA under an Emergency Use Authorization (EUA). This EUA will remain ?in effect (meaning this test can be used) for the duration of the ?COVID-19 declaration under Section 564(b)(1) of the Act, 21 U.S.C. ?section 360bbb-3(b)(1), unless the authorization is terminated or ?revoked. ? ?Performed at [facility], [address] ?[place], [place] [id number] ?  ?Blood Culture (routine x 2)     Status: None (Preliminary result)  ? Collection Time: 10/23/21  5:00 PM  ? Specimen: BLOOD  ?Result Value Ref Range Status  ? Specimen Description BLOOD SITE NOT SPECIFIED  Final  ? Special Requests   Final  ?  BOTTLES DRAWN AEROBIC AND ANAEROBIC Blood Culture results may not be optimal due to an inadequate volume of blood received in culture bottles  ? Culture   Final  ?  NO GROWTH 3 DAYS ?Performed at [facility], [address] [place], [place] [address] ?  ? Report Status PENDING  Incomplete  ?Blood Culture (routine x 2)     Status: None (Preliminary result)  ? Collection Time: 10/23/21  5:05 PM  ? Specimen: BLOOD  ?Result Value Ref Range Status  ? Specimen Description BLOOD RIGHT ANTECUBITAL  Final  ? Special Requests   Final  ?  BOTTLES DRAWN AEROBIC AND ANAEROBIC Blood Culture adequate volume  ? Culture   Final  ?  NO GROWTH 3 DAYS ?Performed at [facility], [address] [place], [place] [address] ?  ? Report Status PENDING  Incomplete  ?Resp Panel by RT-PCR (Flu A&B, Covid) Nasopharyngeal Swab     Status: None  ? Collection Time: 10/23/21  6:03 PM  ? Specimen: Nasopharyngeal Swab; Nasopharyngeal(NP) swabs in vial transport medium  ?Result Value Ref Range Status  ? SARS Coronavirus 2 by RT PCR NEGATIVE NEGATIVE Final  ?  Comment: (NOTE) ?SARS-CoV-2 target nucleic acids are NOT DETECTED. ? ?The SARS-CoV-2 RNA is generally detectable in upper respiratory ?specimens during the acute phase of infection. The lowest ?concentration of  SARS-CoV-2 viral copies this assay can detect is ?138 copies/mL. A negative result does not preclude SARS-Cov-2 ?infection and should not be used as the sole basis for treatment or ?other patient management decis

## 2021-10-26 NOTE — Discharge Instructions (Signed)
Pt to f/u with out patient psychiatry ?

## 2021-10-26 NOTE — ED Triage Notes (Signed)
Pt reports to [facility], voluntarily accompanied by [employer]. Per [employer],  pt was at family home and was picked up tonight due to bizarre behaviors.They also state that he had to be redirected numerous times to get inside of the car. Pt was observed staring, pacing and appears to be paranoid. Pt appeared to be speaking to a friend named Pernell and attempted  to walk out of the assessment room. Pt is poor historian,due to current condition. Pt does deny SI, HI and substance/alchohol use. ?

## 2021-10-27 ENCOUNTER — Ambulatory Visit (HOSPITAL_COMMUNITY)
Admission: EM | Admit: 2021-10-27 | Discharge: 2021-10-27 | Disposition: A | Discharge status: Home / Self Care | Payer: No Payment, Other

## 2021-10-27 NOTE — Telephone Encounter (Signed)
Patient discharged on 5/11. Appointment scheduled with Dr. Renold Don on 5/19. ? ?Wyvonne Lenz, RN  ?

## 2021-10-27 NOTE — Telephone Encounter (Signed)
I called to speak with his niece after reviewing last nights events and ER visit.  ? ?It is clear he needs psychiatric evaluation. Paranoid with delusions/hallucinations. She says he put 2 of the 4 bottles of his medication on someone's doorstep and won't tell them where he put it.  ? ?He is getting agitated when they approach him about taking his HIV medications. I recommended she not push it at the moment and focus on keeping him fed, safe and hydrated.  ?We discussed urgent care and mobile crisis interventions along with [employer] assistance if he shows any concern for violence to others or himself.  ? ?Will see about coordinating referral to bridge counselor and arrange an in clinic meeting/introduction next Friday 5/19 with Dr. Renold Don.  ?

## 2021-10-28 LAB — QUANTIFERON-TB GOLD PLUS (RQFGPL)
QuantiFERON Mitogen Value: 0.05 IU/mL
QuantiFERON Nil Value: 0.04 IU/mL
QuantiFERON TB1 Ag Value: 0.03 IU/mL
QuantiFERON TB2 Ag Value: 0.03 IU/mL

## 2021-10-28 LAB — CULTURE, BLOOD (ROUTINE X 2)
Culture: NO GROWTH
Culture: NO GROWTH
Special Requests: ADEQUATE

## 2021-10-28 LAB — QUANTIFERON-TB GOLD PLUS: QuantiFERON-TB Gold Plus: UNDETERMINED — AB

## 2021-11-01 ENCOUNTER — Encounter: Payer: Self-pay | Admitting: Internal Medicine

## 2021-11-01 ENCOUNTER — Ambulatory Visit: Payer: Self-pay

## 2021-11-01 ENCOUNTER — Inpatient Hospital Stay: Payer: Self-pay | Admitting: Physician Assistant

## 2021-11-01 NOTE — Telephone Encounter (Signed)
?  Chief Complaint: behavior problem ?Symptoms: pt being hateful, threaten to hit sister and refused to go to appt or take medications ?Frequency: ongoing  ?Pertinent Negatives: Patient denies SI or HI ?Disposition: [] ED /[] Urgent Care (no appt availability in office) / [x] Appointment(In office/virtual)/ []  [place] [facility]/ [] Home Care/ [] Refused Recommended Disposition /[] [place] [facility]/ []  Follow-up with PCP ?Additional Notes: , pt's sister called in to see what else she could do with pt since she is unable to take him to appt this morning at 0920. She has taken pt to [facility] UC and [facility] ED but they release pt d/t being in his right mind and not having SI/HI. They have even went to magistrate office to get IVC and they refused for that as well. , Angela's daughter wanted to reschedule the appt so she can bring him. Called and spoke with , [facility] who was able to schedule appt at [facility] with Marylene Land, NP on 11/06/21 at 1320. I let Misty Stanley know of this appt and explained to both of them if they continue to have issues with pt or feel threatened to call 911 and hopefully they would be able to do something. Both verbalized understanding.  ? ?Reason for Disposition ? Difficult caller responded to triager counseling ? ?Answer Assessment - Initial Assessment Questions ?1. SITUATION:  Document reason for call. ?    Sister was calling in to see what she can do since pt's behavior is hateful and confused and refuses to get help ?2. BACKGROUND: Document any background information (e.g., prior calls, known psychiatric history) ?    Pt has went to ED and [facility] UC and they release him d/t being in right mind and [facility] and her daughter [facility] see pt's behavior on a daily basis ?3. ASSESSMENT: Document your nursing assessment. ?    Pt refused to get in car with that bitch this morning to come to appt and told Archie Patten he was going to hit her and pt isn't taking his medications as prescribed ?4. RESPONSE: Document what your  response or recommendation was. ?    Rescheduled pt's appt for hosp fu and advised 11/08/21 if she feels threatened or he hits her she can call 911. ? ?Protocols used: Difficult Call-A-AH ? ?

## 2021-11-02 ENCOUNTER — Emergency Department (HOSPITAL_COMMUNITY): Payer: Self-pay

## 2021-11-02 ENCOUNTER — Inpatient Hospital Stay (HOSPITAL_COMMUNITY)
Admission: EM | Admit: 2021-11-02 | Discharge: 2021-11-13 | DRG: 974 | Disposition: A | Discharge status: Home / Self Care | Payer: Self-pay | Attending: Internal Medicine | Admitting: Internal Medicine

## 2021-11-02 ENCOUNTER — Other Ambulatory Visit: Payer: Self-pay

## 2021-11-02 DIAGNOSIS — Z2989 Encounter for other specified prophylactic measures: Secondary | ICD-10-CM

## 2021-11-02 DIAGNOSIS — E872 Acidosis, unspecified: Secondary | ICD-10-CM | POA: Diagnosis present

## 2021-11-02 DIAGNOSIS — B20 Human immunodeficiency virus [HIV] disease: Secondary | ICD-10-CM

## 2021-11-02 DIAGNOSIS — Z91148 Patient's other noncompliance with medication regimen for other reason: Secondary | ICD-10-CM

## 2021-11-02 DIAGNOSIS — Z21 Asymptomatic human immunodeficiency virus [HIV] infection status: Secondary | ICD-10-CM | POA: Diagnosis present

## 2021-11-02 DIAGNOSIS — G9341 Metabolic encephalopathy: Secondary | ICD-10-CM | POA: Diagnosis present

## 2021-11-02 DIAGNOSIS — G4700 Insomnia, unspecified: Secondary | ICD-10-CM | POA: Diagnosis not present

## 2021-11-02 DIAGNOSIS — E538 Deficiency of other specified B group vitamins: Secondary | ICD-10-CM | POA: Diagnosis present

## 2021-11-02 DIAGNOSIS — B370 Candidal stomatitis: Secondary | ICD-10-CM

## 2021-11-02 DIAGNOSIS — H6121 Impacted cerumen, right ear: Secondary | ICD-10-CM | POA: Diagnosis not present

## 2021-11-02 DIAGNOSIS — F061 Catatonic disorder due to known physiological condition: Secondary | ICD-10-CM | POA: Diagnosis not present

## 2021-11-02 DIAGNOSIS — E871 Hypo-osmolality and hyponatremia: Secondary | ICD-10-CM | POA: Diagnosis not present

## 2021-11-02 DIAGNOSIS — F23 Brief psychotic disorder: Secondary | ICD-10-CM | POA: Diagnosis not present

## 2021-11-02 DIAGNOSIS — B3781 Candidal esophagitis: Secondary | ICD-10-CM | POA: Diagnosis present

## 2021-11-02 DIAGNOSIS — J690 Pneumonitis due to inhalation of food and vomit: Secondary | ICD-10-CM | POA: Diagnosis present

## 2021-11-02 DIAGNOSIS — Z20822 Contact with and (suspected) exposure to covid-19: Secondary | ICD-10-CM | POA: Diagnosis present

## 2021-11-02 DIAGNOSIS — E43 Unspecified severe protein-calorie malnutrition: Secondary | ICD-10-CM | POA: Diagnosis present

## 2021-11-02 DIAGNOSIS — D509 Iron deficiency anemia, unspecified: Secondary | ICD-10-CM | POA: Diagnosis present

## 2021-11-02 DIAGNOSIS — J9601 Acute respiratory failure with hypoxia: Secondary | ICD-10-CM

## 2021-11-02 DIAGNOSIS — Z681 Body mass index (BMI) 19 or less, adult: Secondary | ICD-10-CM

## 2021-11-02 DIAGNOSIS — R64 Cachexia: Secondary | ICD-10-CM | POA: Diagnosis present

## 2021-11-02 DIAGNOSIS — I959 Hypotension, unspecified: Secondary | ICD-10-CM | POA: Diagnosis present

## 2021-11-02 DIAGNOSIS — B59 Pneumocystosis: Secondary | ICD-10-CM | POA: Diagnosis present

## 2021-11-02 DIAGNOSIS — A4189 Other specified sepsis: Principal | ICD-10-CM | POA: Diagnosis present

## 2021-11-02 DIAGNOSIS — G928 Other toxic encephalopathy: Secondary | ICD-10-CM | POA: Diagnosis present

## 2021-11-02 DIAGNOSIS — Z781 Physical restraint status: Secondary | ICD-10-CM

## 2021-11-02 DIAGNOSIS — T465X1A Poisoning by other antihypertensive drugs, accidental (unintentional), initial encounter: Secondary | ICD-10-CM | POA: Diagnosis present

## 2021-11-02 DIAGNOSIS — A419 Sepsis, unspecified organism: Secondary | ICD-10-CM

## 2021-11-02 DIAGNOSIS — Z79899 Other long term (current) drug therapy: Secondary | ICD-10-CM

## 2021-11-02 DIAGNOSIS — R6520 Severe sepsis without septic shock: Secondary | ICD-10-CM

## 2021-11-02 DIAGNOSIS — Z818 Family history of other mental and behavioral disorders: Secondary | ICD-10-CM

## 2021-11-02 DIAGNOSIS — I952 Hypotension due to drugs: Secondary | ICD-10-CM

## 2021-11-02 DIAGNOSIS — F17210 Nicotine dependence, cigarettes, uncomplicated: Secondary | ICD-10-CM | POA: Diagnosis present

## 2021-11-02 LAB — CBC WITH DIFFERENTIAL/PLATELET
Abs Immature Granulocytes: 0.14 10*3/uL — ABNORMAL HIGH (ref 0.00–0.07)
Basophils Absolute: 0 10*3/uL (ref 0.0–0.1)
Basophils Relative: 1 %
Eosinophils Absolute: 0.1 10*3/uL (ref 0.0–0.5)
Eosinophils Relative: 1 %
HCT: 29.3 % — ABNORMAL LOW (ref 39.0–52.0)
Hemoglobin: 9.4 g/dL — ABNORMAL LOW (ref 13.0–17.0)
Immature Granulocytes: 2 %
Lymphocytes Relative: 17 %
Lymphs Abs: 1.1 10*3/uL (ref 0.7–4.0)
MCH: 31.6 pg (ref 26.0–34.0)
MCHC: 32.1 g/dL (ref 30.0–36.0)
MCV: 98.7 fL (ref 80.0–100.0)
Monocytes Absolute: 0.3 10*3/uL (ref 0.1–1.0)
Monocytes Relative: 5 %
Neutro Abs: 4.5 10*3/uL (ref 1.7–7.7)
Neutrophils Relative %: 74 %
Platelets: 231 10*3/uL (ref 150–400)
RBC: 2.97 MIL/uL — ABNORMAL LOW (ref 4.22–5.81)
RDW: 14.8 % (ref 11.5–15.5)
WBC: 6.1 10*3/uL (ref 4.0–10.5)
nRBC: 0 % (ref 0.0–0.2)

## 2021-11-02 LAB — TROPONIN I (HIGH SENSITIVITY)
Troponin I (High Sensitivity): <2 ng/L (ref <18)
Troponin I (High Sensitivity): <2 ng/L (ref <18)

## 2021-11-02 LAB — RAPID URINE DRUG SCREEN, HOSP PERFORMED
Amphetamines: NOT DETECTED
Barbiturates: NOT DETECTED
Benzodiazepines: NOT DETECTED
Cocaine: NOT DETECTED
Opiates: NOT DETECTED
Tetrahydrocannabinol: POSITIVE — AB

## 2021-11-02 LAB — APTT: aPTT: 36 seconds (ref 24–36)

## 2021-11-02 LAB — LACTIC ACID, PLASMA
Lactic Acid, Venous: 3.1 mmol/L (ref 0.5–1.9)
Lactic Acid, Venous: 3 mmol/L (ref 0.5–1.9)
Lactic Acid, Venous: 1.4 mmol/L (ref 0.5–1.9)

## 2021-11-02 LAB — COMPREHENSIVE METABOLIC PANEL
ALT: 33 U/L (ref 0–44)
AST: 42 U/L — ABNORMAL HIGH (ref 15–41)
Albumin: 2.6 g/dL — ABNORMAL LOW (ref 3.5–5.0)
Alkaline Phosphatase: 53 U/L (ref 38–126)
Anion gap: 10 (ref 5–15)
BUN: 16 mg/dL (ref 6–20)
CO2: 21 mmol/L — ABNORMAL LOW (ref 22–32)
Calcium: 8.7 mg/dL — ABNORMAL LOW (ref 8.9–10.3)
Chloride: 104 mmol/L (ref 98–111)
Creatinine, Ser: 0.9 mg/dL (ref 0.61–1.24)
GFR, Estimated: >60 mL/min (ref >60)
Glucose, Bld: 106 mg/dL — ABNORMAL HIGH (ref 70–99)
Potassium: 4.3 mmol/L (ref 3.5–5.1)
Sodium: 135 mmol/L (ref 135–145)
Total Bilirubin: 0.7 mg/dL (ref 0.3–1.2)
Total Protein: 7.6 g/dL (ref 6.5–8.1)

## 2021-11-02 LAB — PROTIME-INR
INR: 1.2 (ref 0.8–1.2)
Prothrombin Time: 15.2 seconds (ref 11.4–15.2)

## 2021-11-02 LAB — URINALYSIS, ROUTINE W REFLEX MICROSCOPIC
Bacteria, UA: NONE SEEN
Bilirubin Urine: NEGATIVE
Glucose, UA: NEGATIVE mg/dL
Hgb urine dipstick: NEGATIVE
Ketones, ur: NEGATIVE mg/dL
Leukocytes,Ua: NEGATIVE
Nitrite: NEGATIVE
Protein, ur: 30 mg/dL — AB
Specific Gravity, Urine: 1.021 (ref 1.005–1.030)
pH: 7 (ref 5.0–8.0)

## 2021-11-02 LAB — BLOOD GAS, VENOUS
Acid-base deficit: 3.7 mmol/L — ABNORMAL HIGH (ref 0.0–2.0)
Bicarbonate: 22.7 mmol/L (ref 20.0–28.0)
Drawn by: 25475
O2 Saturation: 79.4 %
Patient temperature: 34.4
pCO2, Ven: 40 mmHg — ABNORMAL LOW (ref 44–60)
pH, Ven: 7.35 (ref 7.25–7.43)
pO2, Ven: 41 mmHg (ref 32–45)

## 2021-11-02 LAB — ETHANOL: Alcohol, Ethyl (B): <10 mg/dL (ref <10)

## 2021-11-02 LAB — AMMONIA: Ammonia: 24 umol/L (ref 9–35)

## 2021-11-02 LAB — LACTATE DEHYDROGENASE: LDH: 285 U/L — ABNORMAL HIGH (ref 98–192)

## 2021-11-02 LAB — RESP PANEL BY RT-PCR (FLU A&B, COVID) ARPGX2
Influenza A by PCR: NEGATIVE
Influenza B by PCR: NEGATIVE
SARS Coronavirus 2 by RT PCR: NEGATIVE

## 2021-11-02 LAB — CK: Total CK: 72 U/L (ref 49–397)

## 2021-11-02 MED ORDER — VANCOMYCIN HCL IN DEXTROSE 1-5 GM/200ML-% IV SOLN
1000.0000 mg | Freq: Once | INTRAVENOUS | Status: AC
  Administered 2021-11-02: 1000 mg via INTRAVENOUS
  Filled 2021-11-02: qty 200

## 2021-11-02 MED ORDER — LACTATED RINGERS IV SOLN: INTRAVENOUS | Status: AC

## 2021-11-02 MED ORDER — LACTATED RINGERS IV BOLUS (SEPSIS)
1000.0000 mL | Freq: Once | INTRAVENOUS | Status: AC
  Administered 2021-11-02: 1000 mL via INTRAVENOUS

## 2021-11-02 MED ORDER — LACTATED RINGERS IV BOLUS
1000.0000 mL | Freq: Once | INTRAVENOUS | Status: AC
  Administered 2021-11-02: 1000 mL via INTRAVENOUS

## 2021-11-02 MED ORDER — CEFEPIME HCL 2 G IV SOLR
2.0000 g | Freq: Three times a day (TID) | INTRAVENOUS | Status: DC
  Administered 2021-11-03 (×2): 2 g via INTRAVENOUS
  Filled 2021-11-02 (×3): qty 12.5

## 2021-11-02 MED ORDER — ENOXAPARIN SODIUM 40 MG/0.4ML IJ SOSY
40.0000 mg | PREFILLED_SYRINGE | INTRAMUSCULAR | Status: DC
  Administered 2021-11-03 – 2021-11-13 (×9): 40 mg via SUBCUTANEOUS
  Filled 2021-11-02 (×11): qty 0.4

## 2021-11-02 MED ORDER — FLUCONAZOLE IN SODIUM CHLORIDE 200-0.9 MG/100ML-% IV SOLN
200.0000 mg | INTRAVENOUS | Status: DC
  Administered 2021-11-03: 200 mg via INTRAVENOUS
  Filled 2021-11-02 (×2): qty 100

## 2021-11-02 MED ORDER — LORAZEPAM 2 MG/ML IJ SOLN
2.0000 mg | Freq: Once | INTRAMUSCULAR | Status: AC
  Administered 2021-11-02: 2 mg via INTRAMUSCULAR
  Filled 2021-11-02: qty 1

## 2021-11-02 MED ORDER — NALOXONE HCL 0.4 MG/ML IJ SOLN
0.4000 mg | Freq: Once | INTRAMUSCULAR | Status: AC
  Administered 2021-11-02: 0.4 mg via INTRAVENOUS
  Filled 2021-11-02: qty 1

## 2021-11-02 MED ORDER — ZIPRASIDONE MESYLATE 20 MG IM SOLR
20.0000 mg | Freq: Once | INTRAMUSCULAR | Status: AC
  Administered 2021-11-02: 20 mg via INTRAMUSCULAR
  Filled 2021-11-02: qty 20

## 2021-11-02 MED ORDER — SODIUM CHLORIDE 0.9 % IV SOLN
2.0000 g | Freq: Once | INTRAVENOUS | Status: AC
  Administered 2021-11-02: 2 g via INTRAVENOUS
  Filled 2021-11-02: qty 12.5

## 2021-11-02 MED ORDER — VANCOMYCIN HCL 750 MG/150ML IV SOLN
750.0000 mg | Freq: Two times a day (BID) | INTRAVENOUS | Status: DC
  Filled 2021-11-02 (×2): qty 150

## 2021-11-02 MED ORDER — ACETAMINOPHEN 650 MG RE SUPP: 650.0000 mg | RECTAL | Status: DC | PRN

## 2021-11-02 MED ORDER — SODIUM CHLORIDE 0.9 % IV BOLUS
1000.0000 mL | Freq: Once | INTRAVENOUS | Status: AC
  Administered 2021-11-02: 1000 mL via INTRAVENOUS

## 2021-11-02 NOTE — Sepsis Progress Note (Signed)
Code sepsis protocol being monitored by eLink. 
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
## 2021-11-02 NOTE — Assessment & Plan Note (Addendum)
-  Suspect encephalopathy is multifactorial from clonidine overdose, sepsis from pneumonia in the setting of untreated HIV/AIDS Presented with fever, tachycardia and chest x-ray finding of persistent pneumonia.  He was recently hospitalized from 5/9 to 5/11 with community-acquired pneumonia superimposed on parainfluenza viral pneumonitis.  He was evaluated by ID and treated on IV ceftriaxone and azithromycin and was advised to finish remaining course outpatient.  Unclear if patient was compliant. -ID was consulted by ED physician and recommended treating with IV vancomycin and cefepime with low suspicion for PCP.  They will see in consultation the morning. -Also found to have several doses of clonidine on him and suspect he had unknown amount of ingestion since he was noted to have pinpoint pupils on exam and is progressively becoming more hypotensive and bradycardic. Trial of one time dose of IV Narcan with no improvement in mentation but this was after he was give Geodon and IM ativan for agitation. Stable and able to maintain airway from respiratory stand point.  -Potential clonidine overdose was discussed with [facility] poison control RN Vikki Ports.  Will obtain repeat EKG, Tylenol level and CBG.   -Continue to monitor on continuous telemetry, frequent vitals and neurochecks.  Currently able to protect airway. -LDH is improved from prior. CT head is negative. Lower suspicion for cryptococcus. Suspect again symptoms related to Clonidine overdose and sepsis.

## 2021-11-02 NOTE — Assessment & Plan Note (Addendum)
Suspect secondary to clonidine overdose.  Patient found to have clonidine pills on him with unknown amount of ingestion. -Remains hypotensive with SBP 90s despite IV 4 L of fluid.  Continue to monitor vitals with goal of SBP >90 and MAP >65 -continuous IV fluid overnight -check repeat EKG, tylenol level and CBG per poison control

## 2021-11-02 NOTE — Assessment & Plan Note (Signed)
Last CD4 on 10/23/2021 of 36 and he has been nonadherent with medication reportedly for 5 years.  He was restarted on Biktarvy on discharge last week.  -will resume once clinical status improves and able to tolerate PO

## 2021-11-02 NOTE — Assessment & Plan Note (Signed)
Presented with hypoxia down to 70% requiring 2 L with chest x-ray findings of persistent pneumonia - Patient was recently admitted for community-acquired pneumonia with positive parainfluenza virus - ID was consulted by ED physician and recommend treatment with IV vancomycin and cefepime -Maintain O2 greater than 92%

## 2021-11-02 NOTE — Progress Notes (Signed)
Patient arrived to unit accompanied by RN in 4 point restraints.  Patient lethargic but does withdraw to pain and moan.  Charge RN at bedside along with other floor staff to help with transfer to bed.  Moved to bed and retied without incident.  Placed on bedside monitor for frequent vitals.  Will continue to monitor once MD has rounded and placed orders.
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
## 2021-11-02 NOTE — ED Provider Notes (Signed)
[facility] DEPT Provider Note   CSN: [id number] Arrival date & time: 11/02/21  1623     History  Chief Complaint  Patient presents with   Altered Mental Status    Jason Herman is a 36 y.o. male.  Level 5 caveat for acuity of condition.  Patient brought in by EMS found trespassing on someone's property sitting on the driveway.  On EMS arrival he would not answer questions but just nod yes or no.  Initial pulse ox was 70% he was placed on 2 L nasal cannula.  Patient not speaking on arrival but responding to pain. He is febrile and tachycardic. Found to have several loose yellow pills in his pants which identified as clonidine Patient unable to give any history. Chart review shows recent admission for community acquired pneumonia in the setting of HIV/AIDS as well as candidiasis.  He should have completed antibiotics at this point.  The history is provided by the patient and the EMS personnel. The history is limited by the condition of the patient.  Altered Mental Status     Home Medications Prior to Admission medications   Medication Sig Start Date End Date Taking? Authorizing Provider  Artificial Tear Ointment (DRY EYES OP) Apply 1 drop to eye daily as needed (dry eyes). Patient not taking: Reported on 10/23/2021    [provider]  bictegravir-emtricitabine-tenofovir AF (BIKTARVY) 50-200-25 MG TABS tablet Take 1 tablet by mouth daily. 10/27/21   Rick Duff, MD  fluconazole (DIFLUCAN) 200 MG tablet Take 1 tablet (200 mg total) by mouth daily for 17 days. 10/27/21 11/13/21  Rick Duff, MD  senna-docusate (SENOKOT-S) 8.6-50 MG tablet Take 1 tablet by mouth at bedtime as needed for mild constipation or moderate constipation. Patient not taking: Reported on 10/23/2021 03/24/20   Long, Wonda [employer], MD      Allergies    Patient has no known allergies.    Review of Systems   Review of Systems  Unable to perform ROS: Mental status change    Physical Exam Updated Vital Signs BP 116/84   Pulse (!) 117   Temp (!) 100.9 F (38.3 C) (Axillary)   Resp (!) 28   SpO2 100%  Physical Exam Vitals and nursing note reviewed.  Constitutional:      General: He is in acute distress.     Appearance: He is well-developed. He is ill-appearing.     Comments: Resting with eyes closed, does not speak.  Responds to pain and moans  HENT:     Head: Normocephalic and atraumatic.     Mouth/Throat:     Mouth: Mucous membranes are dry.     Pharynx: No oropharyngeal exudate.  Eyes:     Conjunctiva/sclera: Conjunctivae normal.     Pupils: Pupils are equal, round, and reactive to light.  Neck:     Comments: No meningismus. Cardiovascular:     Rate and Rhythm: Regular rhythm. Tachycardia present.     Heart sounds: Normal heart sounds. No murmur heard. Pulmonary:     Effort: Respiratory distress present.     Breath sounds: Normal breath sounds.     Comments: No increased work of breathing.  Rhonchi bilaterally. Chest:     Chest wall: No tenderness.  Abdominal:     Palpations: Abdomen is soft.     Tenderness: There is no abdominal tenderness. There is no guarding or rebound.  Musculoskeletal:        General: No tenderness. Normal range of motion.  Cervical back: Normal range of motion and neck supple.  Skin:    General: Skin is warm.  Neurological:     Motor: No abnormal muscle tone.     Comments: Response to pain, moves all extremities, does not speak or follow commands  Psychiatric:        Behavior: Behavior normal.    ED Results / Procedures / Treatments   Labs (all labs ordered are listed, but only abnormal results are displayed) Labs Reviewed  LACTIC ACID, PLASMA - Abnormal; Notable for the following components:      Result Value   Lactic Acid, Venous 3.1 (*)    All other components within normal limits  LACTIC ACID, PLASMA - Abnormal; Notable for the following components:   Lactic Acid, Venous 3.0 (*)    All other  components within normal limits  COMPREHENSIVE METABOLIC PANEL - Abnormal; Notable for the following components:   CO2 21 (*)    Glucose, Bld 106 (*)    Calcium 8.7 (*)    Albumin 2.6 (*)    AST 42 (*)    All other components within normal limits  CBC WITH DIFFERENTIAL/PLATELET - Abnormal; Notable for the following components:   RBC 2.97 (*)    Hemoglobin 9.4 (*)    HCT 29.3 (*)    Abs Immature Granulocytes 0.14 (*)    All other components within normal limits  URINALYSIS, ROUTINE W REFLEX MICROSCOPIC - Abnormal; Notable for the following components:   Protein, ur 30 (*)    All other components within normal limits  RAPID URINE DRUG SCREEN, HOSP PERFORMED - Abnormal; Notable for the following components:   Tetrahydrocannabinol POSITIVE (*)    All other components within normal limits  CBC - Abnormal; Notable for the following components:   RBC 3.30 (*)    Hemoglobin 10.5 (*)    HCT 32.4 (*)    Platelets 124 (*)    All other components within normal limits  BASIC METABOLIC PANEL - Abnormal; Notable for the following components:   Sodium 134 (*)    CO2 19 (*)    Glucose, Bld 103 (*)    Calcium 8.0 (*)    All other components within normal limits  LACTATE DEHYDROGENASE - Abnormal; Notable for the following components:   LDH 285 (*)    All other components within normal limits  LACTIC ACID, PLASMA - Abnormal; Notable for the following components:   Lactic Acid, Venous 2.4 (*)    All other components within normal limits  BLOOD GAS, VENOUS - Abnormal; Notable for the following components:   pCO2, Ven 40 (*)    Acid-base deficit 3.7 (*)    All other components within normal limits  ACETAMINOPHEN LEVEL - Abnormal; Notable for the following components:   Acetaminophen (Tylenol), Serum <10 (*)    All other components within normal limits  RESP PANEL BY RT-PCR (FLU A&B, COVID) ARPGX2  CULTURE, BLOOD (ROUTINE X 2)  CULTURE, BLOOD (ROUTINE X 2)  URINE CULTURE  MRSA NEXT GEN BY  PCR, NASAL  PROTIME-INR  APTT  ETHANOL  AMMONIA  CK  CRYPTOCOCCAL ANTIGEN  LACTIC ACID, PLASMA  GLUCOSE, CAPILLARY  CRYPTOCOCCAL ANTIGEN, CSF  TROPONIN I (HIGH SENSITIVITY)  TROPONIN I (HIGH SENSITIVITY)    EKG EKG Interpretation  Date/Time:  Thursday Nov 02 2021 16:29:57 EDT Ventricular Rate:  133 PR Interval:  125 QRS Duration: 83 QT Interval:  306 QTC Calculation: 456 R Axis:   137 Text Interpretation: Sinus tachycardia Right  axis deviation Rate faster Confirmed by Ezequiel Essex [phone]) on 11/02/2021 5:34:05 PM  Radiology DG Chest Port 1 View  Result Date: 11/02/2021 CLINICAL DATA:  Questionable sepsis - evaluate for abnormality EXAM: PORTABLE CHEST 1 VIEW COMPARISON:  CT chest and chest radiograph Oct 23, 2021. FINDINGS: Mild diffuse interstitial and ill-defined opacities. No confluent consolidation. No visible pleural effusions or pneumothorax. Cardiomediastinal silhouette is within normal limits. No displaced fracture. IMPRESSION: Mild diffuse interstitial and ill-defined opacities, potentially related to findings seen on recent CT chest. No confluent consolidation. Electronically Signed   By: Margaretha Sheffield M.D.   On: 11/02/2021 17:33    Procedures .Critical Care Performed by: Ezequiel Essex, MD Authorized by: Ezequiel Essex, MD   Critical care provider statement:    Critical care time (minutes):  75   Critical care time was exclusive of:  Separately billable procedures and treating other patients   Critical care was necessary to treat or prevent imminent or life-threatening deterioration of the following conditions:  Sepsis   Critical care was time spent personally by me on the following activities:  Development of treatment plan with patient or surrogate, discussions with consultants, evaluation of patient's response to treatment, examination of patient, ordering and review of laboratory studies, ordering and review of radiographic studies, ordering and  performing treatments and interventions, pulse oximetry, re-evaluation of patient's condition and review of old charts   I assumed direction of critical care for this patient from another provider in my specialty: yes     Care discussed with: admitting provider      Medications Ordered in ED Medications  lactated ringers infusion (has no administration in time range)  vancomycin (VANCOCIN) IVPB 1000 mg/200 mL premix (has no administration in time range)  ceFEPIme (MAXIPIME) 2 g in sodium chloride 0.9 % 100 mL IVPB (has no administration in time range)  lactated ringers bolus 1,000 mL (has no administration in time range)  sodium chloride 0.9 % bolus 1,000 mL (has no administration in time range)  lactated ringers bolus 1,000 mL (has no administration in time range)    ED Course/ Medical Decision Making/ A&P                           Medical Decision Making Amount and/or Complexity of Data Reviewed Independent Historian: parent and EMS Labs: ordered. Decision-making details documented in ED Course. Radiology: ordered and independent interpretation performed. Decision-making details documented in ED Course. ECG/medicine tests: ordered and independent interpretation performed. Decision-making details documented in ED Course.  Risk Prescription drug management. Decision regarding hospitalization.  Altered mental status with recent admission for pneumonia and candidiasis in the setting of HIV/AIDS.  Patient is tachycardic, febrile on arrival he is protecting his airway but does not speak and does not follow commands.  Responds to pain.  Code sepsis activated on arrival.  Patient initiated on broad-spectrum antibiotics after cultures obtained  Patient requiring physical and chemical restraint for agitation.  Lactic acidosis of 3.1.  Continue broad-spectrum antibiotics and IV fluids.  Chest x-ray is concerning for streaky infiltrates similar to previous  Discussed with infectious disease  Dr. Gale Journey.  He is familiar with patient.  He is agrees with vancomycin and cefepime empirically.  Does not believe this is PCP pneumonia.  This should cover for any potential CNS infection as well.  May consider lumbar puncture if mental status is not improving.  Lactic acidosis noted. Drug screen with THC only. Some bradycardia in 40s  and BP in 90s. Question whether patient did ingest clonidine that he is not prescribed.  Continue IVF. No positive response to narcan.  Patient did become more alert and agitated, pulling at IVs and trying to climb out of bed. This required chemical sedation with geodon and ativan.  Continues to maintain airway. CT head stable. Results reviewed and intepreted by me.   Will need admission. Consider lumbar puncture in AM, but patient cannot cooperate for or consent to procedure currently. Has already received broad spectrum antibiotics.   Admission d/w Dr. Flossie Buffy.        Final Clinical Impression(s) / ED Diagnoses Final diagnoses:  Sepsis with encephalopathy without septic shock, due to unspecified organism [facility])    Rx / DC Orders ED Discharge Orders     None         Ezequiel Essex, MD 11/03/21 [phone]

## 2021-11-02 NOTE — Progress Notes (Signed)
Pharmacy Antibiotic Note  Jason Herman is a 36 y.o. male admitted on 11/02/2021 with AMS.  Pharmacy has been consulted to dose vancomycin and cefepime for HCAP.  1st doses given in the ED  Plan: Vancomycin 750mg  IV q12h (AUC 505.4, Scr 0.9, TBW) Cefepime 2gm IV q8h Follow renal function, cultures and clinical course  Height: 6' (182.9 cm) Weight: 54 kg (119 lb 0.8 oz) IBW/kg (Calculated) : 77.6  Temp (24hrs), Avg:100.4 F (38 C), Min:99.6 F (37.6 C), Max:100.9 F (38.3 C)  Recent Labs  Lab 11/02/21 1646 11/02/21 1846  WBC 6.1  --   CREATININE 0.90  --   LATICACIDVEN 3.1* 3.0*    Estimated Creatinine Clearance: 86.7 mL/min (by C-G formula based on SCr of 0.9 mg/dL).    No Known Allergies  Antimicrobials this admission: Vanc 5/18 >> Cefepime 5/18 >>  Dose adjustments this admission:   Microbiology results: 5/18 BCx:  5/18 UCx:     Thank you for allowing pharmacy to be a part of this patient's care.  6/18 RPh 11/02/2021, 10:50 PM

## 2021-11-02 NOTE — Assessment & Plan Note (Signed)
He was treated with IV fluconazole in recent admission advised to finish regimen on 5/22.  We will continue IV fluconazole.

## 2021-11-02 NOTE — Progress Notes (Signed)
Pharmacy Note   A consult was received from an ED physician for vancomycin and cefepime per pharmacy dosing.    The patient's profile has been reviewed for ht/wt/allergies/indication/available labs.    A one time order has been placed for vancomycin 100 mg IV x1 and cefepime 2 gr IV x1 .    Further antibiotics/pharmacy consults should be ordered by admitting physician if indicated.                       Thank you,  Adalberto Cole, PharmD, BCPS 11/02/2021 4:49 PM

## 2021-11-03 ENCOUNTER — Other Ambulatory Visit: Payer: Self-pay | Admitting: Pharmacist

## 2021-11-03 ENCOUNTER — Inpatient Hospital Stay: Payer: Self-pay | Admitting: Internal Medicine

## 2021-11-03 DIAGNOSIS — J690 Pneumonitis due to inhalation of food and vomit: Secondary | ICD-10-CM

## 2021-11-03 DIAGNOSIS — B20 Human immunodeficiency virus [HIV] disease: Secondary | ICD-10-CM

## 2021-11-03 DIAGNOSIS — G9341 Metabolic encephalopathy: Secondary | ICD-10-CM

## 2021-11-03 DIAGNOSIS — E872 Acidosis, unspecified: Secondary | ICD-10-CM

## 2021-11-03 LAB — BASIC METABOLIC PANEL
Anion gap: 5 (ref 5–15)
BUN: 13 mg/dL (ref 6–20)
CO2: 19 mmol/L — ABNORMAL LOW (ref 22–32)
Calcium: 8 mg/dL — ABNORMAL LOW (ref 8.9–10.3)
Chloride: 110 mmol/L (ref 98–111)
Creatinine, Ser: 0.71 mg/dL (ref 0.61–1.24)
GFR, Estimated: >60 mL/min (ref >60)
Glucose, Bld: 103 mg/dL — ABNORMAL HIGH (ref 70–99)
Potassium: 4.2 mmol/L (ref 3.5–5.1)
Sodium: 134 mmol/L — ABNORMAL LOW (ref 135–145)

## 2021-11-03 LAB — ACETAMINOPHEN LEVEL: Acetaminophen (Tylenol), Serum: <10 ug/mL — ABNORMAL LOW (ref 10–30)

## 2021-11-03 LAB — CBC
HCT: 32.4 % — ABNORMAL LOW (ref 39.0–52.0)
Hemoglobin: 10.5 g/dL — ABNORMAL LOW (ref 13.0–17.0)
MCH: 31.8 pg (ref 26.0–34.0)
MCHC: 32.4 g/dL (ref 30.0–36.0)
MCV: 98.2 fL (ref 80.0–100.0)
Platelets: 124 10*3/uL — ABNORMAL LOW (ref 150–400)
RBC: 3.3 MIL/uL — ABNORMAL LOW (ref 4.22–5.81)
RDW: 15 % (ref 11.5–15.5)
WBC: 5.4 10*3/uL (ref 4.0–10.5)
nRBC: 0 % (ref 0.0–0.2)

## 2021-11-03 LAB — CRYPTOCOCCAL ANTIGEN: Crypto Ag: NEGATIVE

## 2021-11-03 LAB — MRSA NEXT GEN BY PCR, NASAL: MRSA by PCR Next Gen: NOT DETECTED

## 2021-11-03 LAB — LACTIC ACID, PLASMA: Lactic Acid, Venous: 2.4 mmol/L (ref 0.5–1.9)

## 2021-11-03 LAB — GLUCOSE, CAPILLARY: Glucose-Capillary: 93 mg/dL (ref 70–99)

## 2021-11-03 MED ORDER — HALOPERIDOL LACTATE 5 MG/ML IJ SOLN
2.0000 mg | Freq: Four times a day (QID) | INTRAMUSCULAR | Status: DC | PRN
  Administered 2021-11-03 – 2021-11-07 (×6): 2 mg via INTRAVENOUS
  Filled 2021-11-03 (×6): qty 1

## 2021-11-03 MED ORDER — SODIUM CHLORIDE 0.9 % IV SOLN
3.0000 g | Freq: Four times a day (QID) | INTRAVENOUS | Status: DC
  Administered 2021-11-03 – 2021-11-05 (×7): 3 g via INTRAVENOUS
  Filled 2021-11-03 (×8): qty 8

## 2021-11-03 MED ORDER — ACETAMINOPHEN 325 MG PO TABS
650.0000 mg | ORAL_TABLET | Freq: Four times a day (QID) | ORAL | Status: DC | PRN
  Administered 2021-11-03 – 2021-11-13 (×5): 650 mg via ORAL
  Filled 2021-11-03 (×5): qty 2

## 2021-11-03 MED ORDER — FLUCONAZOLE 100 MG PO TABS
200.0000 mg | ORAL_TABLET | Freq: Every day | ORAL | Status: DC
  Administered 2021-11-04 – 2021-11-13 (×10): 200 mg via ORAL
  Filled 2021-11-03 (×10): qty 2

## 2021-11-03 MED ORDER — ORAL CARE MOUTH RINSE
15.0000 mL | Freq: Two times a day (BID) | OROMUCOSAL | Status: DC
  Administered 2021-11-03 – 2021-11-13 (×16): 15 mL via OROMUCOSAL

## 2021-11-03 MED ORDER — SULFAMETHOXAZOLE-TRIMETHOPRIM 800-160 MG PO TABS
2.0000 | ORAL_TABLET | Freq: Once | ORAL | Status: DC
  Filled 2021-11-03: qty 2

## 2021-11-03 MED ORDER — BICTEGRAVIR-EMTRICITAB-TENOFOV 50-200-25 MG PO TABS: 1.0000 | ORAL_TABLET | Freq: Every day | ORAL | 0 refills | Status: AC

## 2021-11-03 MED ORDER — SULFAMETHOXAZOLE-TRIMETHOPRIM 800-160 MG PO TABS
2.0000 | ORAL_TABLET | Freq: Two times a day (BID) | ORAL | Status: DC
  Administered 2021-11-03 – 2021-11-05 (×4): 2 via ORAL
  Filled 2021-11-03 (×4): qty 2

## 2021-11-03 MED ORDER — LORAZEPAM 2 MG/ML IJ SOLN
1.0000 mg | Freq: Four times a day (QID) | INTRAMUSCULAR | Status: DC | PRN
  Administered 2021-11-03: 1 mg via INTRAVENOUS
  Filled 2021-11-03: qty 1

## 2021-11-03 MED ORDER — CHLORHEXIDINE GLUCONATE CLOTH 2 % EX PADS
6.0000 | MEDICATED_PAD | Freq: Every day | CUTANEOUS | Status: DC
  Administered 2021-11-03 – 2021-11-05 (×4): 6 via TOPICAL

## 2021-11-03 MED ORDER — SODIUM CHLORIDE 0.9 % IV SOLN: INTRAVENOUS | Status: DC
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
## 2021-11-03 NOTE — Progress Notes (Signed)
Pharmacy Antibiotic Note  Jason Herman is a 36 y.o. male admitted on 11/02/2021 with SOB/AMS - noted HIV/AIDS with some concern for asp and PJP PNA. Pharmacy has been consulted for Unasyn and Bactrim dosing.   Given the patient's low weight of 54.5 kg and low O2 requirements - will round down with Bactrim dosing and initiate at around ~12 mg/kg/day TMP component (Bactrim DS 2 tabs bid). There is some literature showing that doses as low as ~10 mg/kg/day TMP have similar efficacy with less ADEs. If the patient worsens clinically from a respiratory standpoint - can consider increasing that to 2 tabs tid to average closer to ~17.5 mg/kg/day TMP. Noted plans to consult CCM for BAL to confirm or rule out PJP.   Plan: - Start Unasyn 3g IV every 6 hours - Start Bactrim DS 2 tabs po twice daily for now (~12 mg/kg/day TMP component) - Will hold off on steroids at this time  - Will monitor renal function and clinical status  Height: 6' (182.9 cm) Weight: 54.5 kg (120 lb 2.4 oz) IBW/kg (Calculated) : 77.6  Temp (24hrs), Avg:98.9 F (37.2 C), Min:94.8 F (34.9 C), Max:102.9 F (39.4 C)  Recent Labs  Lab 11/02/21 1646 11/02/21 1846 11/02/21 2235 11/03/21 0121  WBC 6.1  --   --  5.4  CREATININE 0.90  --   --  0.71  LATICACIDVEN 3.1* 3.0* 1.4 2.4*    Estimated Creatinine Clearance: 98.4 mL/min (by C-G formula based on SCr of 0.71 mg/dL).    No Known Allergies  Antimicrobials this admission: Vancomycin 5/18 >> Cefepime 5/18 >> Fluconazole 5/19 >>  Dose adjustments this admission: N/a  Microbiology results: 5/18 UCx >> 5/18 BCx >> ng<24h 5/18 MRSA PCR >> neg  Thank you for allowing pharmacy to be a part of this patient's care.  Alycia Rossetti, PharmD, BCPS Infectious Diseases Clinical Pharmacist 11/03/2021 12:19 PM   **Pharmacist phone directory can now be found on [email or web address] ([email or web address]).  Listed under [place].

## 2021-11-03 NOTE — Progress Notes (Signed)
Restraints applied to bilateral wrists per order and removed from ankles.
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
## 2021-11-03 NOTE — Progress Notes (Signed)
PROGRESS NOTE    Jason Herman  DUK:[id number] DOB: 1985/12/24 DOA: 11/02/2021 PCP: Patient, No Pcp Per (Inactive)   Brief Narrative: 36 year old with past medical history significant for HIV/AIDS with nonadherence with medication, paranoia, oral candidiasis who presented with altered mental status.  Patient was found to be obtunded and hypoxic on admission.  Subsequently he got agitated in the ED and received Geodon.  He was found to have 4 yellow pills  with him which were later identified as clonidine.  He was found to be febrile, hypotensive and hypoxic.  He was recently hospitalized from 5/8 until 5/11 for community-acquired pneumonia. Chest x-ray; mild diffuse interstitial and ill-defined opacities, potentially related to findings seen on recent CT chest.  Assessment & Plan:   Principal Problem:   Acute metabolic encephalopathy Active Problems:   Acute respiratory failure with hypoxia (HCC)   HIV (human immunodeficiency virus infection) ([place])   Oral candidiasis   Sepsis with acute organ dysfunction (HCC)   Hypotension   Aspiration pneumonia (HCC)   Metabolic acidosis  1-Acute Metabolic Encephalopathy: Agree with encephalopathy likely multifactorial in the setting of clonidine overdose, sepsis from pneumonia, untreated underlying HIV. CT head: No acute intracranial abnormality.  Diffuse parenchymal atrophy, advanced in relation of patient's stated age. Evaluated by ID, no need for LP at this time.  He is alert and conversant today, denies headaches, neck stiffness.  He received Narcan yesterday without significant improvement on MS.  Received Geodon and Ativan subsequently for agitation.    2-Acute hypoxic respiratory failure on admission, PNA -He presented with oxygen saturation down to 70%.  He was placed on oxygen. -Pneumonia, could be aspiration -Currently he is off of oxygen oxygen sat more than 90% -Antibiotics changed to ampicillin by ID. -He was also started on  treatment for PCP pneumonia with Bactrim. -CCM consulted for evaluation of bronchoscopy BAL  3-Hypotension: Suspect related to clonidine overdose -He was found to have clonidine pills on him with unknown amount of ingestion. -Hypotensive initially received 4 L of IV fluids. -Blood pressure improved. -He still had some pills on his bag.  Medication was given to the nurse and she will take them to  the pharmacy.   4-Oral candidiasis: Continue with fluconazole  5-HIV/AIDS CD4 count 5/80/2023 was 36. No adherent to medication He was a started on Biktarvy on discharge last week. ID consulted plan to hold [place].  Anemia; check anemia panel.   Metabolic acidosis, lactic acidosis.  Lactic acidosis could be related to infection, and hypoperfusion , hypoxemia.  Improved with IV fluids.   Mild hyponatremia; continue  with IV fluids.   Estimated body mass index is 16.3 kg/m as calculated from the following:   Height as of this encounter: 6' (1.829 m).   Weight as of this encounter: 54.5 kg.   DVT prophylaxis: Lovenox Code Status: Full code Family Communication: care discussed with patient.  Disposition Plan:  Status is: Inpatient Remains inpatient appropriate because: management of infection, PNA    Consultants:  CCM ID  Procedures:    Antimicrobials:    Subjective: He is alert and conversant. He said someone gave him clonidine.  He denies headaches. He would like to eat   Objective: Vitals:   11/03/21 0900 11/03/21 1000 11/03/21 1100 11/03/21 1200  BP: (!) 122/59     Pulse:      Resp: (!) 23 (!) 22 18 (!) 26  Temp:    98.2 F (36.8 C)  TempSrc:    Oral  SpO2:  Weight:      Height:        Intake/Output Summary (Last 24 hours) at 11/03/2021 1327 Last data filed at 11/03/2021 1008 Gross per 24 hour  Intake 4361 ml  Output 900 ml  Net 3461 ml   Filed Weights   11/02/21 1719 11/03/21 0139  Weight: 54 kg 54.5 kg    Examination:  General exam:  Appears calm and comfortable  Respiratory system: Clear to auscultation. Respiratory effort normal. Cardiovascular system: S1 & S2 heard, RRR. No JVD, murmurs, rubs, gallops or clicks. No pedal edema. Gastrointestinal system: Abdomen is nondistended, soft and nontender. No organomegaly or masses felt. Normal bowel sounds heard. Central nervous system: Alert and oriented. Calm, follows command   Data Reviewed: I have personally reviewed following labs and imaging studies  CBC: Recent Labs  Lab 11/02/21 1646 11/03/21 0121  WBC 6.1 5.4  NEUTROABS 4.5  --   HGB 9.4* 10.5*  HCT 29.3* 32.4*  MCV 98.7 98.2  PLT 231 854*   Basic Metabolic Panel: Recent Labs  Lab 11/02/21 1646 11/03/21 0121  NA 135 134*  K 4.3 4.2  CL 104 110  CO2 21* 19*  GLUCOSE 106* 103*  BUN 16 13  CREATININE 0.90 0.71  CALCIUM 8.7* 8.0*   GFR: Estimated Creatinine Clearance: 98.4 mL/min (by C-G formula based on SCr of 0.71 mg/dL). Liver Function Tests: Recent Labs  Lab 11/02/21 1646  AST 42*  ALT 33  ALKPHOS 53  BILITOT 0.7  PROT 7.6  ALBUMIN 2.6*   No results for input(s): LIPASE, AMYLASE in the last 168 hours. Recent Labs  Lab 11/02/21 1646  AMMONIA 24   Coagulation Profile: Recent Labs  Lab 11/02/21 1646  INR 1.2   Cardiac Enzymes: Recent Labs  Lab 11/02/21 1646  CKTOTAL 72   BNP (last 3 results) No results for input(s): PROBNP in the last 8760 hours. HbA1C: No results for input(s): HGBA1C in the last 72 hours. CBG: Recent Labs  Lab 11/03/21 0006  GLUCAP 93   Lipid Profile: No results for input(s): CHOL, HDL, LDLCALC, TRIG, CHOLHDL, LDLDIRECT in the last 72 hours. Thyroid Function Tests: No results for input(s): TSH, T4TOTAL, FREET4, T3FREE, THYROIDAB in the last 72 hours. Anemia Panel: No results for input(s): VITAMINB12, FOLATE, FERRITIN, TIBC, IRON, RETICCTPCT in the last 72 hours. Sepsis Labs: Recent Labs  Lab 11/02/21 1646 11/02/21 1846 11/02/21 2235  11/03/21 0121  LATICACIDVEN 3.1* 3.0* 1.4 2.4*    Recent Results (from the past 240 hour(s))  Blood Culture (routine x 2)     Status: None (Preliminary result)   Collection Time: 11/02/21  4:55 PM   Specimen: BLOOD  Result Value Ref Range Status   Specimen Description   Final    BLOOD BLOOD LEFT FOREARM Performed at [facility], [place] [address] [place], [place] [address]    Special Requests   Final    BOTTLES DRAWN AEROBIC ONLY Blood Culture results may not be optimal due to an inadequate volume of blood received in culture bottles Performed at [place] [address] [place], [place] [address]    Culture   Final    NO GROWTH < 24 HOURS Performed at [place] [address], [place], [place] [address]    Report Status PENDING  Incomplete  Blood Culture (routine x 2)     Status: None (Preliminary result)   Collection Time: 11/02/21  5:05 PM   Specimen: BLOOD  Result  Value Ref Range Status   Specimen Description   Final    BLOOD RIGHT ANTECUBITAL Performed at [place] [address], [place], [place] [address]    Special Requests   Final    BOTTLES DRAWN AEROBIC AND ANAEROBIC Blood Culture results may not be optimal due to an excessive volume of blood received in culture bottles Performed at [facility] [place] [address], [place], [place] [address]    Culture   Final    NO GROWTH < 24 HOURS Performed at [facility] [address], [place], [place] [address]    Report Status PENDING  Incomplete  Resp Panel by RT-PCR (Flu A&B, Covid) Nasopharyngeal Swab     Status: None   Collection Time: 11/02/21  5:25 PM   Specimen: Nasopharyngeal Swab; Nasopharyngeal(NP) swabs in vial transport medium  Result Value Ref Range Status   SARS Coronavirus 2 by RT PCR NEGATIVE NEGATIVE Final    Comment: (NOTE) SARS-CoV-2 target nucleic acids are NOT DETECTED.  The SARS-CoV-2 RNA is  generally detectable in upper respiratory specimens during the acute phase of infection. The lowest concentration of SARS-CoV-2 viral copies this assay can detect is 138 copies/mL. A negative result does not preclude SARS-Cov-2 infection and should not be used as the sole basis for treatment or other patient management decisions. A negative result may occur with  improper specimen collection/handling, submission of specimen other than nasopharyngeal swab, presence of viral mutation(s) within the areas targeted by this assay, and inadequate number of viral copies(<138 copies/mL). A negative result must be combined with clinical observations, patient history, and epidemiological information. The expected result is Negative.  Fact Sheet for Patients:  EntrepreneurPulse.com.au  Fact Sheet for Healthcare Providers:  IncredibleEmployment.be  This test is no t yet approved or cleared by the [place] FDA and  has been authorized for detection and/or diagnosis of SARS-CoV-2 by FDA under an Emergency Use Authorization (EUA). This EUA will remain  in effect (meaning this test can be used) for the duration of the COVID-19 declaration under Section 564(b)(1) of the Act, 21 U.S.C.section 360bbb-3(b)(1), unless the authorization is terminated  or revoked sooner.       Influenza A by PCR NEGATIVE NEGATIVE Final   Influenza B by PCR NEGATIVE NEGATIVE Final    Comment: (NOTE) The Xpert Xpress SARS-CoV-2/FLU/RSV plus assay is intended as an aid in the diagnosis of influenza from Nasopharyngeal swab specimens and should not be used as a sole basis for treatment. Nasal washings and aspirates are unacceptable for Xpert Xpress SARS-CoV-2/FLU/RSV testing.  Fact Sheet for Patients: EntrepreneurPulse.com.au  Fact Sheet for Healthcare Providers: IncredibleEmployment.be  This test is not yet approved or cleared by the [place] FDA and has been authorized for detection and/or diagnosis of SARS-CoV-2 by FDA under an Emergency Use Authorization (EUA). This EUA will remain in effect (meaning this test can be used) for the duration of the COVID-19 declaration under Section 564(b)(1) of the Act, 21 U.S.C. section 360bbb-3(b)(1), unless the authorization is terminated or revoked.  Performed at [facility], [place] [address], [place], [place] [address]   MRSA Next Gen by PCR, Nasal     Status: None   Collection Time: 11/03/21 12:50 AM   Specimen: Nasal Mucosa; Nasal Swab  Result Value Ref Range Status   MRSA by PCR Next Gen NOT DETECTED NOT DETECTED Final    Comment: (NOTE) The GeneXpert MRSA Assay (FDA approved for NASAL specimens only),  is one component of a comprehensive MRSA colonization surveillance program. It is not intended to diagnose MRSA infection nor to guide or monitor treatment for MRSA infections. Test performance is not FDA approved in patients less than 59 years old. Performed at [facility], [place] [address], [place], [place] [address]          Radiology Studies: CT Head Wo Contrast  Result Date: 11/02/2021 CLINICAL DATA:  Delirium EXAM: CT HEAD WITHOUT CONTRAST TECHNIQUE: Contiguous axial images were obtained from the base of the skull through the vertex without intravenous contrast. RADIATION DOSE REDUCTION: This exam was performed according to the departmental dose-optimization program which includes automated exposure control, adjustment of the mA and/or kV according to patient size and/or use of iterative reconstruction technique. COMPARISON:  10/21/2021 FINDINGS: Brain: There is mild diffuse parenchymal volume loss which is advanced in relation to the patient's stated age. No abnormal intra or extra-axial mass lesion or fluid collection. No abnormal mass effect or midline shift. No evidence of acute intracranial hemorrhage or infarct. Ventricular  size is normal. Cerebellum unremarkable. Vascular: Unremarkable Skull: Intact Sinuses/Orbits: Mild diffuse mucosal thickening within the visualized paranasal sinuses with small air-fluid level partially visualized within the right maxillary sinus. Orbits are unremarkable. Other: Mastoid air cells and middle ear cavities are clear. IMPRESSION: No acute intracranial abnormality. Diffuse parenchymal atrophy, advanced in relation of the patient's stated age. Mild diffuse paranasal sinus disease Electronically Signed   By: Fidela Salisbury M.D.   On: 11/02/2021 18:45   DG Chest Port 1 View  Result Date: 11/02/2021 CLINICAL DATA:  Questionable sepsis - evaluate for abnormality EXAM: PORTABLE CHEST 1 VIEW COMPARISON:  CT chest and chest radiograph Oct 23, 2021. FINDINGS: Mild diffuse interstitial and ill-defined opacities. No confluent consolidation. No visible pleural effusions or pneumothorax. Cardiomediastinal silhouette is within normal limits. No displaced fracture. IMPRESSION: Mild diffuse interstitial and ill-defined opacities, potentially related to findings seen on recent CT chest. No confluent consolidation. Electronically Signed   By: Margaretha Sheffield M.D.   On: 11/02/2021 17:33        Scheduled Meds:  Chlorhexidine Gluconate Cloth  6 each Topical Daily   enoxaparin (LOVENOX) injection  40 mg Subcutaneous Q24H   [START ON 11/04/2021] fluconazole  200 mg Oral Daily   mouth rinse  15 mL Mouth Rinse BID   sulfamethoxazole-trimethoprim  2 tablet Oral Once   sulfamethoxazole-trimethoprim  2 tablet Oral BID   Continuous Infusions:  ampicillin-sulbactam (UNASYN) IV       LOS: 1 day    Time spent: 35 minutes    Korra Christine A Lyon Dumont, MD [facility]   If 7PM-7AM, please contact night-coverage [email or web address]  11/03/2021, 1:27 PM

## 2021-11-03 NOTE — Progress Notes (Signed)
Medication Samples have been provided to the patient.  Drug name: Biktarvy        Strength: 50/200/25 mg       Qty: 14 tablets (2 bottles) LOT: [id number]   Exp.Date: 9/25  Dosing instructions: Take one tablet by mouth once daily  The patient has been instructed regarding the correct time, dose, and frequency of taking this medication, including desired effects and most common side effects.   Michel Hendon, PharmD, CPP Clinical Pharmacist Practitioner Infectious Diseases Clinical Pharmacist [facility]  

## 2021-11-03 NOTE — TOC Initial Note (Signed)
Transition of Care [facility]) - Initial/Assessment Note    Patient Details  Name: Jason Herman MRN: [id number] Date of Birth: 1985-11-23  Transition of Care [facility]) CM/SW Contact:    Golda Acre, RN Phone Number: 11/03/2021, 8:06 AM  Clinical Narrative:                  Transition of Care [facility]) Screening Note   Patient Details  Name: Jason Herman Date of Birth: 11-01-1985   Transition of Care [facility]) CM/SW Contact:    Golda Acre, RN Phone Number: 11/03/2021, 8:06 AM    Transition of Care Department [facility]) has reviewed patient and no TOC needs have been identified at this time. We will continue to monitor patient advancement through interdisciplinary progression rounds. If new patient transition needs arise, please place a TOC consult.    Expected Discharge Plan: Home/Self Care Barriers to Discharge: No Barriers Identified   Patient Goals and CMS Choice Patient states their goals for this hospitalization and ongoing recovery are:: will not state at this time      Expected Discharge Plan and Services Expected Discharge Plan: Home/Self Care   Discharge Planning Services: CM Consult                                          Prior Living Arrangements/Services     Patient language and need for interpreter reviewed:: Yes              Criminal Activity/Legal Involvement Pertinent to Current Situation/Hospitalization: No - Comment as needed  Activities of Daily Living Home Assistive Devices/Equipment: Other (Comment) (pt unable to answer) ADL Screening (condition at time of admission) Patient's cognitive ability adequate to safely complete daily activities?: No Is the patient deaf or have difficulty hearing?: No Does the patient have difficulty seeing, even when wearing glasses/contacts?: No Does the patient have difficulty concentrating, remembering, or making decisions?: Yes Patient able to express need for assistance with ADLs?: No Does the  patient have difficulty dressing or bathing?: Yes Independently performs ADLs?: No Communication: Needs assistance Is this a change from baseline?: Change from baseline, expected to last <3 days Dressing (OT): Needs assistance Is this a change from baseline?: Change from baseline, expected to last <3days Grooming: Needs assistance Is this a change from baseline?: Change from baseline, expected to last <3 days Feeding: Needs assistance Is this a change from baseline?: Change from baseline, expected to last <3 days Bathing: Needs assistance Is this a change from baseline?: Change from baseline, expected to last <3 days Toileting: Needs assistance Is this a change from baseline?: Change from baseline, expected to last <3 days In/Out Bed: Needs assistance Is this a change from baseline?: Change from baseline, expected to last <3 days Walks in Home: Needs assistance Is this a change from baseline?: Change from baseline, expected to last <3 days Does the patient have difficulty walking or climbing stairs?: Yes Weakness of Legs: None Weakness of Arms/Hands: None  Permission Sought/Granted                  Emotional Assessment Appearance:: Appears stated age     Orientation: : Fluctuating Orientation (Suspected and/or reported Sundowners) Alcohol / Substance Use: Illicit Drugs Psych Involvement: No (comment)  Admission diagnosis:  Acute metabolic encephalopathy [G93.41] Sepsis with encephalopathy without septic shock, due to unspecified organism (HCC) [A41.9, R65.20, G93.40] Patient Active  Problem List   Diagnosis Date Noted   Acute metabolic encephalopathy 11/02/2021   Acute respiratory failure with hypoxia (HCC) 11/02/2021   Hypotension 11/02/2021   Protein-calorie malnutrition, severe 10/25/2021   Pneumonia of both lungs due to infectious organism    Sepsis with acute organ dysfunction (HCC)    Dysphagia 10/23/2021   Weight loss 10/23/2021   Underinsured 10/23/2021    Oral candidiasis 10/23/2021   Lobar pneumonia, unspecified organism (HCC) 10/23/2021   AIDS (acquired immune deficiency syndrome) (HCC) 10/23/2021   Normocytic anemia 10/23/2021   Elevated transaminase level 10/23/2021   HIV (human immunodeficiency virus infection) (HCC) 01/14/2017   Vaccine counseling 01/14/2017   PCP:  Patient, No Pcp Per (Inactive) Pharmacy:   [facility] [phone] - [place], [place] - [phone] AT [facility] OF [address] & [address] [place] [address] Phone: [phone] Fax: [phone]  [facility] [address] [place] [address] Phone: [phone] Fax: [phone]     Social Determinants of Health (SDOH) Interventions    Readmission Risk Interventions     View : No data to display.

## 2021-11-03 NOTE — Progress Notes (Signed)
Patient is becoming increasingly agitated and beginning to pull lines and attempting to exit the bed (put his bedrail down on his own). Patient would like water to drink and is requesting a bottle of hand sanitizer. This nurse witnessed the patient with tremors and becoming verbally abusive to staff members. Will notify oncoming nursing staff of this progression.
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
## 2021-11-03 NOTE — Progress Notes (Signed)
   11/03/21 0043  Assess: MEWS Score  Temp (!) 94.8 F (34.9 C)  Level of Consciousness Responds to Voice  SpO2 100 %  O2 Device Nasal Cannula  Patient Activity (if Appropriate) In bed  O2 Flow Rate (L/min) 2 L/min  Assess: MEWS Score  MEWS Temp 2  MEWS Systolic 1  MEWS Pulse 0  MEWS RR 0  MEWS LOC 1  MEWS Score 4  MEWS Score Color Red  Treat  Pain Scale 0-10  Pain Score 0  Take Vital Signs  Increase Vital Sign Frequency  Red: Q 1hr X 4 then Q 4hr X 4, if remains red, continue Q 4hrs  Escalate  MEWS: Escalate Red: discuss with charge nurse/RN and provider, consider discussing with RRT  Notify: Charge Nurse/RN  Name of Charge Nurse/RN Notified Lauren, RN  Date Charge Nurse/RN Notified 11/03/21  Time Charge Nurse/RN Notified 0045  Notify: Provider  Provider Name/Title Tu  Date Provider Notified 11/03/21  Time Provider Notified 0045  Method of Notification Page  Notification Reason Change in status  Provider response See new orders (transfer stepdown)  Date of Provider Response 11/03/21  Time of Provider Response 0050  Notify: Rapid Response  Name of Rapid Response RN Notified George Ina  Date Rapid Response Notified 11/03/21  Time Rapid Response Notified 0045  Document  Patient Outcome Transferred/level of care increased  Assess: SIRS CRITERIA  SIRS Temperature  1  SIRS Pulse 0  SIRS Respirations  0  SIRS WBC 0  SIRS Score Sum  1

## 2021-11-03 NOTE — Progress Notes (Signed)
Upon examination, patient is compliant, calm, alert, oriented, able to follow commands; discussed removal of restraints at this time. Patient gave his verbal understanding that he will inform his care team if he needs assistance and will comply with our safety instructions to keep him and staff safe. Restraints removed at this time and this nurse will continue to monitor the patient for mood changes.

## 2021-11-04 ENCOUNTER — Encounter (HOSPITAL_COMMUNITY)
Admission: EM | Disposition: A | Discharge status: Home / Self Care | Payer: Self-pay | Source: Home / Self Care | Attending: Internal Medicine

## 2021-11-04 ENCOUNTER — Inpatient Hospital Stay (HOSPITAL_COMMUNITY): Payer: Self-pay | Admitting: Certified Registered Nurse Anesthetist

## 2021-11-04 DIAGNOSIS — D649 Anemia, unspecified: Secondary | ICD-10-CM

## 2021-11-04 DIAGNOSIS — J189 Pneumonia, unspecified organism: Secondary | ICD-10-CM

## 2021-11-04 DIAGNOSIS — B20 Human immunodeficiency virus [HIV] disease: Secondary | ICD-10-CM

## 2021-11-04 HISTORY — PX: VIDEO BRONCHOSCOPY: SHX5072

## 2021-11-04 HISTORY — PX: BRONCHIAL WASHINGS: SHX5105

## 2021-11-04 LAB — GLUCOSE, CAPILLARY: Glucose-Capillary: 243 mg/dL — ABNORMAL HIGH (ref 70–99)

## 2021-11-04 LAB — URINE CULTURE: Culture: NO GROWTH

## 2021-11-04 LAB — BASIC METABOLIC PANEL
Anion gap: 5 (ref 5–15)
BUN: 11 mg/dL (ref 6–20)
CO2: 23 mmol/L (ref 22–32)
Calcium: 8 mg/dL — ABNORMAL LOW (ref 8.9–10.3)
Chloride: 110 mmol/L (ref 98–111)
Creatinine, Ser: 0.81 mg/dL (ref 0.61–1.24)
GFR, Estimated: >60 mL/min (ref >60)
Glucose, Bld: 93 mg/dL (ref 70–99)
Potassium: 3.5 mmol/L (ref 3.5–5.1)
Sodium: 138 mmol/L (ref 135–145)

## 2021-11-04 LAB — RETICULOCYTES
Immature Retic Fract: 16.8 % — ABNORMAL HIGH (ref 2.3–15.9)
RBC.: 2.26 MIL/uL — ABNORMAL LOW (ref 4.22–5.81)
Retic Count, Absolute: 23.1 10*3/uL (ref 19.0–186.0)
Retic Ct Pct: 1 % (ref 0.4–3.1)

## 2021-11-04 LAB — CBC
HCT: 22.9 % — ABNORMAL LOW (ref 39.0–52.0)
Hemoglobin: 7.2 g/dL — ABNORMAL LOW (ref 13.0–17.0)
MCH: 31 pg (ref 26.0–34.0)
MCHC: 31.4 g/dL (ref 30.0–36.0)
MCV: 98.7 fL (ref 80.0–100.0)
Platelets: 173 10*3/uL (ref 150–400)
RBC: 2.32 MIL/uL — ABNORMAL LOW (ref 4.22–5.81)
RDW: 15 % (ref 11.5–15.5)
WBC: 3 10*3/uL — ABNORMAL LOW (ref 4.0–10.5)
nRBC: 0 % (ref 0.0–0.2)

## 2021-11-04 LAB — IRON AND TIBC
Iron: 15 ug/dL — ABNORMAL LOW (ref 45–182)
Saturation Ratios: 10 % — ABNORMAL LOW (ref 17.9–39.5)
TIBC: 156 ug/dL — ABNORMAL LOW (ref 250–450)
UIBC: 141 ug/dL

## 2021-11-04 LAB — BODY FLUID CELL COUNT WITH DIFFERENTIAL
Eos, Fluid: 0 %
Lymphs, Fluid: 92 %
Monocyte-Macrophage-Serous Fluid: 3 % — ABNORMAL LOW (ref 50–90)
Neutrophil Count, Fluid: 5 % (ref 0–25)
Total Nucleated Cell Count, Fluid: UNDETERMINED cu mm (ref 0–1000)

## 2021-11-04 LAB — FOLATE: Folate: 5.8 ng/mL — ABNORMAL LOW (ref >5.9)

## 2021-11-04 LAB — FERRITIN: Ferritin: 1350 ng/mL — ABNORMAL HIGH (ref 24–336)

## 2021-11-04 LAB — VITAMIN B12: Vitamin B-12: 1224 pg/mL — ABNORMAL HIGH (ref 180–914)

## 2021-11-04 SURGERY — VIDEO BRONCHOSCOPY WITHOUT FLUORO
Anesthesia: General

## 2021-11-04 MED ORDER — TRAZODONE HCL 50 MG PO TABS: 50.0000 mg | ORAL_TABLET | Freq: Two times a day (BID) | ORAL | Status: DC

## 2021-11-04 MED ORDER — LIDOCAINE HCL 1 % IJ SOLN
INTRAMUSCULAR | Status: DC | PRN
Start: 2021-11-04 — End: 2021-11-04
  Administered 2021-11-04: 20 mL

## 2021-11-04 MED ORDER — PROPOFOL 10 MG/ML IV BOLUS
INTRAVENOUS | Status: AC
  Filled 2021-11-04: qty 20

## 2021-11-04 MED ORDER — PROPOFOL 10 MG/ML IV BOLUS
INTRAVENOUS | Status: DC | PRN
  Administered 2021-11-04: 150 mg via INTRAVENOUS

## 2021-11-04 MED ORDER — GUAIFENESIN 100 MG/5ML PO LIQD
5.0000 mL | ORAL | Status: DC | PRN
  Administered 2021-11-04 – 2021-11-05 (×2): 5 mL via ORAL
  Filled 2021-11-04 (×3): qty 10

## 2021-11-04 MED ORDER — OXYCODONE HCL 5 MG/5ML PO SOLN: 5.0000 mg | Freq: Once | ORAL | Status: DC | PRN

## 2021-11-04 MED ORDER — FERROUS SULFATE 325 (65 FE) MG PO TABS
325.0000 mg | ORAL_TABLET | Freq: Every day | ORAL | Status: DC
  Administered 2021-11-04 – 2021-11-13 (×10): 325 mg via ORAL
  Filled 2021-11-04 (×11): qty 1

## 2021-11-04 MED ORDER — FENTANYL CITRATE (PF) 100 MCG/2ML IJ SOLN
INTRAMUSCULAR | Status: DC | PRN
  Administered 2021-11-04: 100 ug via INTRAVENOUS

## 2021-11-04 MED ORDER — RISPERIDONE 1 MG PO TABS
1.0000 mg | ORAL_TABLET | Freq: Two times a day (BID) | ORAL | Status: DC
  Administered 2021-11-04 – 2021-11-05 (×2): 1 mg via ORAL
  Filled 2021-11-04 (×2): qty 1

## 2021-11-04 MED ORDER — MIDAZOLAM HCL 5 MG/5ML IJ SOLN
INTRAMUSCULAR | Status: DC | PRN
  Administered 2021-11-04: 2 mg via INTRAVENOUS

## 2021-11-04 MED ORDER — DEXAMETHASONE SODIUM PHOSPHATE 10 MG/ML IJ SOLN
INTRAMUSCULAR | Status: DC | PRN
Start: 2021-11-04 — End: 2021-11-04
  Administered 2021-11-04: 10 mg via INTRAVENOUS

## 2021-11-04 MED ORDER — FOLIC ACID 1 MG PO TABS
1.0000 mg | ORAL_TABLET | Freq: Every day | ORAL | Status: DC
  Administered 2021-11-04 – 2021-11-13 (×10): 1 mg via ORAL
  Filled 2021-11-04 (×11): qty 1

## 2021-11-04 MED ORDER — TRAZODONE HCL 50 MG PO TABS
50.0000 mg | ORAL_TABLET | Freq: Every day | ORAL | Status: DC
Start: 2021-11-04 — End: 2021-11-11
  Administered 2021-11-04 – 2021-11-10 (×7): 50 mg via ORAL
  Filled 2021-11-04 (×8): qty 1

## 2021-11-04 MED ORDER — HYDROMORPHONE HCL 1 MG/ML IJ SOLN: 0.2500 mg | INTRAMUSCULAR | Status: DC | PRN

## 2021-11-04 MED ORDER — ONDANSETRON HCL 4 MG/2ML IJ SOLN
INTRAMUSCULAR | Status: DC | PRN
  Administered 2021-11-04: 4 mg via INTRAVENOUS

## 2021-11-04 MED ORDER — LACTATED RINGERS IV SOLN: INTRAVENOUS | Status: DC | PRN

## 2021-11-04 MED ORDER — POTASSIUM CHLORIDE 10 MEQ/100ML IV SOLN
10.0000 meq | INTRAVENOUS | Status: AC
  Administered 2021-11-04 (×3): 10 meq via INTRAVENOUS
  Filled 2021-11-04 (×2): qty 100

## 2021-11-04 MED ORDER — PROMETHAZINE HCL 25 MG/ML IJ SOLN: 6.2500 mg | INTRAMUSCULAR | Status: DC | PRN

## 2021-11-04 MED ORDER — SUCCINYLCHOLINE CHLORIDE 200 MG/10ML IV SOSY
PREFILLED_SYRINGE | INTRAVENOUS | Status: DC | PRN
Start: 2021-11-04 — End: 2021-11-04
  Administered 2021-11-04: 120 mg via INTRAVENOUS

## 2021-11-04 MED ORDER — AMISULPRIDE (ANTIEMETIC) 5 MG/2ML IV SOLN: 10.0000 mg | Freq: Once | INTRAVENOUS | Status: DC | PRN

## 2021-11-04 MED ORDER — FENTANYL CITRATE (PF) 100 MCG/2ML IJ SOLN
INTRAMUSCULAR | Status: AC
  Filled 2021-11-04: qty 2

## 2021-11-04 MED ORDER — LIDOCAINE 2% (20 MG/ML) 5 ML SYRINGE
INTRAMUSCULAR | Status: DC | PRN
  Administered 2021-11-04: 60 mg via INTRAVENOUS

## 2021-11-04 MED ORDER — OXYCODONE HCL 5 MG PO TABS: 5.0000 mg | ORAL_TABLET | Freq: Once | ORAL | Status: DC | PRN

## 2021-11-04 MED ORDER — MIDAZOLAM HCL 2 MG/2ML IJ SOLN
INTRAMUSCULAR | Status: AC
  Filled 2021-11-04: qty 2

## 2021-11-04 NOTE — Anesthesia Preprocedure Evaluation (Addendum)
Anesthesia Evaluation  Patient identified by MRN, date of birth, ID band Patient awake    Reviewed: Allergy & Precautions, NPO status , Patient's Chart, lab work & pertinent test results  Airway Mallampati: II  TM Distance: >3 FB Neck ROM: Full    Dental no notable dental hx.    Pulmonary pneumonia, Current Smoker and Patient abstained from smoking.,    Pulmonary exam normal breath sounds clear to auscultation       Cardiovascular negative cardio ROS Normal cardiovascular exam Rhythm:Regular Rate:Normal     Neuro/Psych negative neurological ROS  negative psych ROS   GI/Hepatic negative GI ROS, Neg liver ROS,   Endo/Other  negative endocrine ROS  Renal/GU negative Renal ROS  negative genitourinary   Musculoskeletal negative musculoskeletal ROS (+)   Abdominal   Peds negative pediatric ROS (+)  Hematology  (+) Blood dyscrasia, anemia , HIV,   Anesthesia Other Findings   Reproductive/Obstetrics negative OB ROS                             Anesthesia Physical Anesthesia Plan  ASA: 3 and emergent  Anesthesia Plan: General   Post-op Pain Management: Minimal or no pain anticipated   Induction: Intravenous  PONV Risk Score and Plan: 1 and Ondansetron and Treatment may vary due to age or medical condition  Airway Management Planned: Oral ETT  Additional Equipment:   Intra-op Plan:   Post-operative Plan: Extubation in OR  Informed Consent: I have reviewed the patients History and Physical, chart, labs and discussed the procedure including the risks, benefits and alternatives for the proposed anesthesia with the patient or authorized representative who has indicated his/her understanding and acceptance.     Dental advisory given  Plan Discussed with: CRNA  Anesthesia Plan Comments:        Anesthesia Quick Evaluation

## 2021-11-04 NOTE — Plan of Care (Signed)
  Problem: Nutrition: Goal: Adequate nutrition will be maintained Outcome: Progressing   Problem: Coping: Goal: Level of anxiety will decrease Outcome: Progressing   Problem: Elimination: Goal: Will not experience complications related to bowel motility Outcome: Progressing Goal: Will not experience complications related to urinary retention Outcome: Progressing   Problem: Pain Managment: Goal: General experience of comfort will improve Outcome: Progressing   

## 2021-11-04 NOTE — Progress Notes (Signed)
PROGRESS NOTE    Jason Herman  XNA:[id number] DOB: 1985/11/05 DOA: 11/02/2021 PCP: Patient, No Pcp Per (Inactive)   Brief Narrative: 36 year old with past medical history significant for HIV/AIDS with nonadherence with medication, paranoia, oral candidiasis who presented with altered mental status.  Patient was found to be obtunded and hypoxic on admission.  Subsequently he got agitated in the ED and received Geodon.  He was found to have 4 yellow pills  with him which were later identified as clonidine.  He was found to be febrile, hypotensive and hypoxic.  He was recently hospitalized from 5/8 until 5/11 for community-acquired pneumonia. Chest x-ray; mild diffuse interstitial and ill-defined opacities, potentially related to findings seen on recent CT chest.  Assessment & Plan:   Principal Problem:   Acute metabolic encephalopathy Active Problems:   Acute respiratory failure with hypoxia (HCC)   HIV (human immunodeficiency virus infection) ([place])   Oral candidiasis   Sepsis with acute organ dysfunction (HCC)   Hypotension   Aspiration pneumonia (HCC)   Metabolic acidosis  1-Acute Metabolic Encephalopathy: -Encephalopathy likely multifactorial in the setting of clonidine overdose, sepsis from pneumonia, untreated underlying HIV. -CT head: No acute intracranial abnormality.  Diffuse parenchymal atrophy, advanced in relation of patient's stated age. -Evaluated by ID, no need for LP at this time.  -He received Narcan on admission  without significant improvement on MS.  -Received Geodon and Ativan subsequently for agitation on admission./  -He is uncooperative at times, agitated. IV Ativan PRN, Haldol PRN ordered.  -Patient with paranoid thought. Psych consulted.   2-Acute Hypoxic Respiratory failure on admission, PNA -He presented with oxygen saturation down to 70%.  He was placed on oxygen. -Pneumonia, could be aspiration -Currently he is off of oxygen oxygen sat more than  90% -Antibiotics changed to ampicillin by ID. -He was also started on treatment for PCP pneumonia with Bactrim. -CCM consulted for evaluation of bronchoscopy BAL -Underwent Bronchoscopy 5/20, follow culture.   3-Hypotension: Suspect related to clonidine overdose -He was found to have clonidine pills on him with unknown amount of ingestion. -Hypotensive initially received 4 L of IV fluids. -Blood pressure improved.  4-Oral candidiasis: Continue with fluconazole.  5-HIV/AIDS CD4 count 5/80/2023 was 36. No adherent to medication He was a started on Biktarvy on discharge last admission.  ID consulted plan to hold [place].   Anemia; Anemia panel. : Iron deficiency anemia.Will start iron supplement.  Folic acid deficiency. Start folic acid.   Metabolic acidosis, lactic acidosis.  Lactic acidosis could be related to infection, and hypoperfusion , Hypoxemia.  Improved with IV fluids.   Mild hyponatremia; continue  with IV fluids.   Estimated body mass index is 16.3 kg/m as calculated from the following:   Height as of this encounter: 6' (1.829 m).   Weight as of this encounter: 54.5 kg.   DVT prophylaxis: Lovenox Code Status: Full code Family Communication: care discussed with patient.  Disposition Plan:  Status is: Inpatient Remains inpatient appropriate because: management of infection, PNA    Consultants:  CCM ID  Procedures:    Antimicrobials:    Subjective: I saw patient in endoscopy unit. I explain to him, next step in treatment, diagnosis is to proceed with bronchoscopy  He threat to leave AMA>   Objective: Vitals:   11/03/21 1800 11/03/21 2032 11/04/21 0005 11/04/21 0321  BP: (!) 90/53     Pulse: 94     Resp: 19     Temp:  99.4 F (37.4 C) (!)  102.8 F (39.3 C) 97.6 F (36.4 C)  TempSrc:  Oral Oral Oral  SpO2: 99%     Weight:      Height:        Intake/Output Summary (Last 24 hours) at 11/04/2021 9937 Last data filed at 11/03/2021 1904 Gross  per 24 hour  Intake 1475.92 ml  Output --  Net 1475.92 ml    Filed Weights   11/02/21 1719 11/03/21 0139  Weight: 54 kg 54.5 kg    Examination:  General exam: NAD Respiratory system: CTA Cardiovascular system: S 1, S 2 RRR Gastrointestinal system: BS present, soft, nt Central nervous system: alert and oriented.   Data Reviewed: I have personally reviewed following labs and imaging studies  CBC: Recent Labs  Lab 11/02/21 1646 11/03/21 0121 11/04/21 0244  WBC 6.1 5.4 3.0*  NEUTROABS 4.5  --   --   HGB 9.4* 10.5* 7.2*  HCT 29.3* 32.4* 22.9*  MCV 98.7 98.2 98.7  PLT 231 124* 169    Basic Metabolic Panel: Recent Labs  Lab 11/02/21 1646 11/03/21 0121 11/04/21 0244  NA 135 134* 138  K 4.3 4.2 3.5  CL 104 110 110  CO2 21* 19* 23  GLUCOSE 106* 103* 93  BUN _0 CREATININE 0.90 0.71 0.81  CALCIUM 8.7* 8.0* 8.0*    GFR: Estimated Creatinine Clearance: 97.2 mL/min (by C-G formula based on SCr of 0.81 mg/dL). Liver Function Tests: Recent Labs  Lab 11/02/21 1646  AST 42*  ALT 33  ALKPHOS 53  BILITOT 0.7  PROT 7.6  ALBUMIN 2.6*    No results for input(s): LIPASE, AMYLASE in the last 168 hours. Recent Labs  Lab 11/02/21 1646  AMMONIA 24    Coagulation Profile: Recent Labs  Lab 11/02/21 1646  INR 1.2    Cardiac Enzymes: Recent Labs  Lab 11/02/21 1646  CKTOTAL 72    BNP (last 3 results) No results for input(s): PROBNP in the last 8760 hours. HbA1C: No results for input(s): HGBA1C in the last 72 hours. CBG: Recent Labs  Lab 11/03/21 0006  GLUCAP 93    Lipid Profile: No results for input(s): CHOL, HDL, LDLCALC, TRIG, CHOLHDL, LDLDIRECT in the last 72 hours. Thyroid Function Tests: No results for input(s): TSH, T4TOTAL, FREET4, T3FREE, THYROIDAB in the last 72 hours. Anemia Panel: Recent Labs    11/04/21 0244  VITAMINB12 1,224*  FOLATE 5.8*  FERRITIN 1,350*  TIBC 156*  IRON 15*  RETICCTPCT 1.0   Sepsis Labs: Recent Labs   Lab 11/02/21 1646 11/02/21 1846 11/02/21 2235 11/03/21 0121  LATICACIDVEN 3.1* 3.0* 1.4 2.4*     Recent Results (from the past 240 hour(s))  Blood Culture (routine x 2)     Status: None (Preliminary result)   Collection Time: 11/02/21  4:55 PM   Specimen: BLOOD  Result Value Ref Range Status   Specimen Description   Final    BLOOD BLOOD LEFT FOREARM Performed at [facility], [place] [address] [place], [place] [address]    Special Requests   Final    BOTTLES DRAWN AEROBIC ONLY Blood Culture results may not be optimal due to an inadequate volume of blood received in culture bottles Performed at [facility] [address], [place], [place] [address]    Culture   Final    NO GROWTH < 24 HOURS Performed at [facility] [address] [place], [place] [address]    Report Status PENDING  Incomplete  Blood Culture (routine x 2)     Status: None (Preliminary result)   Collection Time: 11/02/21  5:05 PM   Specimen: BLOOD  Result Value Ref Range Status   Specimen Description   Final    BLOOD RIGHT ANTECUBITAL Performed at [facility] [place] [place] [address] [place], [place] [address]    Special Requests   Final    BOTTLES DRAWN AEROBIC AND ANAEROBIC Blood Culture results may not be optimal due to an excessive volume of blood received in culture bottles Performed at [facility] [address], [place], [place] [address]    Culture   Final    NO GROWTH < 24 HOURS Performed at [facility] [address], [place], [place] [address]    Report Status PENDING  Incomplete  Resp Panel by RT-PCR (Flu A&B, Covid) Nasopharyngeal Swab     Status: None   Collection Time: 11/02/21  5:25 PM   Specimen: Nasopharyngeal Swab; Nasopharyngeal(NP) swabs in vial transport medium  Result Value Ref Range Status   SARS Coronavirus 2 by RT PCR NEGATIVE NEGATIVE Final    Comment: (NOTE) SARS-CoV-2 target nucleic  acids are NOT DETECTED.  The SARS-CoV-2 RNA is generally detectable in upper respiratory specimens during the acute phase of infection. The lowest concentration of SARS-CoV-2 viral copies this assay can detect is 138 copies/mL. A negative result does not preclude SARS-Cov-2 infection and should not be used as the sole basis for treatment or other patient management decisions. A negative result may occur with  improper specimen collection/handling, submission of specimen other than nasopharyngeal swab, presence of viral mutation(s) within the areas targeted by this assay, and inadequate number of viral copies(<138 copies/mL). A negative result must be combined with clinical observations, patient history, and epidemiological information. The expected result is Negative.  Fact Sheet for Patients:  EntrepreneurPulse.com.au  Fact Sheet for Healthcare Providers:  IncredibleEmployment.be  This test is no t yet approved or cleared by the [place] FDA and  has been authorized for detection and/or diagnosis of SARS-CoV-2 by FDA under an Emergency Use Authorization (EUA). This EUA will remain  in effect (meaning this test can be used) for the duration of the COVID-19 declaration under Section 564(b)(1) of the Act, 21 U.S.C.section 360bbb-3(b)(1), unless the authorization is terminated  or revoked sooner.       Influenza A by PCR NEGATIVE NEGATIVE Final   Influenza B by PCR NEGATIVE NEGATIVE Final    Comment: (NOTE) The Xpert Xpress SARS-CoV-2/FLU/RSV plus assay is intended as an aid in the diagnosis of influenza from Nasopharyngeal swab specimens and should not be used as a sole basis for treatment. Nasal washings and aspirates are unacceptable for Xpert Xpress SARS-CoV-2/FLU/RSV testing.  Fact Sheet for Patients: EntrepreneurPulse.com.au  Fact Sheet for Healthcare Providers: IncredibleEmployment.be  This  test is not yet approved or cleared by the [place] FDA and has been authorized for detection and/or diagnosis of SARS-CoV-2 by FDA under an Emergency Use Authorization (EUA). This EUA will remain in effect (meaning this test can be used) for the duration of the COVID-19 declaration under Section 564(b)(1) of the Act, 21 U.S.C. section 360bbb-3(b)(1), unless the authorization is terminated or revoked.  Performed at [facility], [place] [address] [place], [place] [address]   MRSA Next Gen by PCR, Nasal     Status: None   Collection Time: 11/03/21 12:50 AM   Specimen: Nasal Mucosa; Nasal Swab  Result Value Ref Range Status  MRSA by PCR Next Gen NOT DETECTED NOT DETECTED Final    Comment: (NOTE) The GeneXpert MRSA Assay (FDA approved for NASAL specimens only), is one component of a comprehensive MRSA colonization surveillance program. It is not intended to diagnose MRSA infection nor to guide or monitor treatment for MRSA infections. Test performance is not FDA approved in patients less than 54 years old. Performed at [facility], [place] [address], [place], [place] [address]           Radiology Studies: CT Head Wo Contrast  Result Date: 11/02/2021 CLINICAL DATA:  [place] EXAM: CT HEAD WITHOUT CONTRAST TECHNIQUE: Contiguous axial images were obtained from the base of the skull through the vertex without intravenous contrast. RADIATION DOSE REDUCTION: This exam was performed according to the departmental dose-optimization program which includes automated exposure control, adjustment of the mA and/or kV according to patient size and/or use of iterative reconstruction technique. COMPARISON:  10/21/2021 FINDINGS: Brain: There is mild diffuse parenchymal volume loss which is advanced in relation to the patient's stated age. No abnormal intra or extra-axial mass lesion or fluid collection. No abnormal mass effect or midline shift. No evidence of  acute intracranial hemorrhage or infarct. Ventricular size is normal. Cerebellum unremarkable. Vascular: Unremarkable Skull: Intact Sinuses/Orbits: Mild diffuse mucosal thickening within the visualized paranasal sinuses with small air-fluid level partially visualized within the right maxillary sinus. Orbits are unremarkable. Other: Mastoid air cells and middle ear cavities are clear. IMPRESSION: No acute intracranial abnormality. Diffuse parenchymal atrophy, advanced in relation of the patient's stated age. Mild diffuse paranasal sinus disease Electronically Signed   By: Fidela Salisbury M.D.   On: 11/02/2021 18:45   DG Chest Port 1 View  Result Date: 11/02/2021 CLINICAL DATA:  Questionable sepsis - evaluate for abnormality EXAM: PORTABLE CHEST 1 VIEW COMPARISON:  CT chest and chest radiograph Oct 23, 2021. FINDINGS: Mild diffuse interstitial and ill-defined opacities. No confluent consolidation. No visible pleural effusions or pneumothorax. Cardiomediastinal silhouette is within normal limits. No displaced fracture. IMPRESSION: Mild diffuse interstitial and ill-defined opacities, potentially related to findings seen on recent CT chest. No confluent consolidation. Electronically Signed   By: Margaretha Sheffield M.D.   On: 11/02/2021 17:33        Scheduled Meds:  Chlorhexidine Gluconate Cloth  6 each Topical Daily   enoxaparin (LOVENOX) injection  40 mg Subcutaneous Q24H   fluconazole  200 mg Oral Daily   mouth rinse  15 mL Mouth Rinse BID   sulfamethoxazole-trimethoprim  2 tablet Oral Once   sulfamethoxazole-trimethoprim  2 tablet Oral BID   Continuous Infusions:  sodium chloride Stopped (11/03/21 1904)   ampicillin-sulbactam (UNASYN) IV Stopped (11/04/21 4259)   potassium chloride 10 mEq (11/04/21 0643)     LOS: 2 days    Time spent: 35 minutes    Reade Trefz A Kandie Keiper, MD [facility]   If 7PM-7AM, please contact night-coverage [email or web address]  11/04/2021, 7:14 AM

## 2021-11-04 NOTE — Anesthesia Postprocedure Evaluation (Signed)
Anesthesia Post Note  Patient: Jason Herman  Procedure(s) Performed: VIDEO BRONCHOSCOPY WITHOUT FLUORO BRONCHIAL WASHINGS     Patient location during evaluation: PACU Anesthesia Type: General Level of consciousness: awake and alert Pain management: pain level controlled Vital Signs Assessment: post-procedure vital signs reviewed and stable Respiratory status: spontaneous breathing, nonlabored ventilation and respiratory function stable Cardiovascular status: blood pressure returned to baseline and stable Postop Assessment: no apparent nausea or vomiting Anesthetic complications: no   No notable events documented.  Last Vitals:  Vitals:   11/04/21 0850 11/04/21 0900  BP: 124/77   Pulse: 83   Resp: (!) 27   Temp:  36.6 C  SpO2: 99%     Last Pain:  Vitals:   11/04/21 0900  TempSrc: Oral  PainSc:                  Lynda Rainwater

## 2021-11-04 NOTE — Anesthesia Procedure Notes (Signed)
Procedure Name: Intubation Date/Time: 11/04/2021 8:04 AM Performed by: Gerald Leitz, CRNA Pre-anesthesia Checklist: Patient identified, Patient being monitored, Timeout performed, Emergency Drugs available and Suction available Patient Re-evaluated:Patient Re-evaluated prior to induction Oxygen Delivery Method: Circle system utilized Preoxygenation: Pre-oxygenation with 100% oxygen Induction Type: IV induction Ventilation: Mask ventilation without difficulty Laryngoscope Size: Mac and 3 Grade View: Grade I Tube type: Oral Tube size: 9.0 mm Number of attempts: 1 Airway Equipment and Method: Stylet Placement Confirmation: ETT inserted through vocal cords under direct vision, positive ETCO2 and breath sounds checked- equal and bilateral Secured at: 22 cm Tube secured with: Tape Dental Injury: Teeth and Oropharynx as per pre-operative assessment

## 2021-11-04 NOTE — Interval H&P Note (Signed)
History and Physical Interval Note:  11/04/2021 6:52 AM  Jason Herman  has presented today for surgery, with the diagnosis of pneumonia.  The various methods of treatment have been discussed with the patient and family. After consideration of risks, benefits and other options for treatment, the patient has consented to  Procedure(s): VIDEO BRONCHOSCOPY WITHOUT FLUORO (N/A) as a surgical intervention.  The patient's history has been reviewed, patient examined, no change in status, stable for surgery.  I have reviewed the patient's chart and labs.  Questions were answered to the patient's satisfaction.     Freddi Starr

## 2021-11-04 NOTE — Op Note (Signed)
Bronchoscopy Procedure Note  Jason Herman  [id number]  05/05/1986  Date:11/04/21  Time:8:14 AM   Provider Performing:Jason Herman   Procedure(s):  Flexible Bronchoscopy [phone]) and Flexible bronchoscopy with bronchial alveolar lavage ([id number])  Indication(s) Pneumonia  Consent Risks of the procedure as well as the alternatives and risks of each were explained to the patient and/or caregiver.  Consent for the procedure was obtained and is signed in the bedside chart  Anesthesia general   Time Out Verified patient identification, verified procedure, site/side was marked, verified correct patient position, special equipment/implants available, medications/allergies/relevant history reviewed, required imaging and test results available.   Sterile Technique Usual hand hygiene, masks, gowns, and gloves were used   Procedure Description Bronchoscope advanced through endotracheal tube and into airway.  Airways were examined down to subsegmental level with findings noted below.   Following diagnostic evaluation, BAL(s) performed in RUL with normal saline and return of 59m fluid x 2  Findings:  - Normal appearing airways - No significant secretions noted   Complications/Tolerance None; patient tolerated the procedure well. Chest X-ray is not needed post procedure.   EBL Minimal   Specimen(s) RUL BAL sent for cultures, cell count, and cytology

## 2021-11-04 NOTE — Transfer of Care (Signed)
Immediate Anesthesia Transfer of Care Note  Patient: Jason Herman  Procedure(s) Performed: Procedure(s): VIDEO BRONCHOSCOPY WITHOUT FLUORO (N/A) BRONCHIAL WASHINGS  Patient Location: PACU and Endoscopy Unit  Anesthesia Type:General  Level of Consciousness: awake, alert  and oriented  Airway & Oxygen Therapy: Patient Spontanous Breathing and Patient connected to nasal cannula oxygen  Post-op Assessment: Report given to RN and Post -op Vital signs reviewed and stable  Post vital signs: Reviewed and stable  Last Vitals:  Vitals:   11/04/21 0720 11/04/21 0750  BP: 103/84 129/88  Pulse: 90 74  Resp: 18 (!) 21  Temp: 36.4 C   SpO2: 100% 100%    Complications: No apparent anesthesia complications

## 2021-11-04 NOTE — Consult Note (Addendum)
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 800-160 MG per tablet 2 tablet  2 tablet Oral BID Mignon Pine, DO   2 tablet at 11/04/21 1007              Psychiatric Specialty Exam:  Presentation  General Appearance: Bizarre; Disheveled  Eye Contact:Fleeting  Speech:Garbled  Speech Volume:Normal  Handedness:Ambidextrous   Mood and Affect  Mood:Irritable  Affect:Constricted   Thought Process  Thought Processes:Disorganized  Descriptions of Associations:Loose  Orientation:Partial  Thought Content:Illogical; Delusions; Paranoid Ideation; Scattered; Tangential  History of Schizophrenia/Schizoaffective disorder:No  Duration of Psychotic Symptoms:-- (unknown)  Hallucinations:Hallucinations: Auditory  Ideas of Reference:Delusions; Paranoia  Suicidal Thoughts:Suicidal Thoughts: No  Homicidal Thoughts:Homicidal Thoughts: No   Sensorium  Memory:Immediate Good; Recent Good; Remote Good  Judgment:Poor  Insight:Fair; Poor   Executive Functions  Concentration:Poor  Attention Span:Poor  Recall:Poor  Fund of Knowledge:Fair  Language:Fair   Psychomotor Activity  Psychomotor Activity:No data recorded  Assets  Assets:No data recorded  Sleep  Sleep:Sleep: Poor   Physical Exam: Physical Exam Vitals reviewed.  Pulmonary:     Effort: Pulmonary effort is normal.   Review of Systems  Psychiatric/Behavioral:  Positive for hallucinations. The patient is nervous/anxious and has insomnia.    Blood pressure (!) 114/45, pulse (!) 106, temperature 98.2 F (36.8 C), temperature source Oral, resp. rate (!) 21, height 6' (1.829 m), weight 54.5 kg, SpO2 93 %. Body mass index is 16.3 kg/m.  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  He tries to leave, the patient should to be IVC for psychiatric illness is decompensated and severe, impairing his ability to care for himself outside of the hospital. -Recommend Starting Risperdal 1 mg q12H for psychosis -Recommend starting trazodone 50 mg qhs for insomnia  -Continue current agitation protocol. Be sure that pt is on cardiac monitoring if he is receiving haldol IV.  -Continue medical management   Disposition: Recommend psychiatric Inpatient admission when medically cleared. Psychiatry CL service to follow  Christoper Allegra, MD 11/04/2021 3:11 PM  Total Time Spent in Direct Patient Care:  I personally spent 60 minutes on the unit in direct patient care. The direct patient care time included face-to-face  time with the patient, reviewing the patient's chart, communicating with other professionals, and coordinating care. Greater than 50% of this time was spent in counseling or coordinating care with the patient regarding goals of hospitalization, psycho-education, and discharge planning needs.   Janine Limbo, MD Psychiatrist

## 2021-11-04 NOTE — Progress Notes (Addendum)
[facility]  Date of Admission:  11/02/2021      Abx: 5/19-c amp-sulb 5/19-c oral bactrim pjp dosing   5/18 vanc cefepime  ASSESSMENT: Hiv/aids Pna -- parainfluenza/cap Ongoing sepsis concerning for pjp process Schizophrenia  Ldh improving without pjp tx. Recent fungitel elevated. Improved on CAP coverage but readmitted found down/confused on the street and fever still at this readmission. Pjp infection suspicion higher  S/p bronch 5/19 with pjp smear along with culture sent Fever still  Stable hemodynamics 3 liters o2 via [place] after bronch otherwise was on room air at presentation  Ams resolved. Has been on room air Crypto ag negative  Lab Results  Component Value Date   CD4TCELL 8 (L) 10/23/2021   CD4TABS 36 (L) 10/23/2021   Lab Results  Component Value Date   HIV1RNAQUANT 119,000 10/24/2021   5/18 bcx ngtd  PLAN: Continue current antimicrobial F/u micro data Hold biktarvy for now unclear given unclear compliance/follow up Dr Earlene Plater will be back Monday   I spent more than 50 minute reviewing data/chart, and coordinating care and >50% direct face to face time providing counseling/discussing diagnostics/treatment plan with patient   Principal Problem:   Acute metabolic encephalopathy Active Problems:   HIV (human immunodeficiency virus infection) (HCC)   Oral candidiasis   Sepsis with acute organ dysfunction (HCC)   Acute respiratory failure with hypoxia (HCC)   Hypotension   Aspiration pneumonia (HCC)   Metabolic acidosis   No Known Allergies  Scheduled Meds:  Chlorhexidine Gluconate Cloth  6 each Topical Daily   enoxaparin (LOVENOX) injection  40 mg Subcutaneous Q24H   ferrous sulfate  325 mg Oral Q breakfast   fluconazole  200 mg Oral Daily   folic acid  1 mg Oral Daily   mouth rinse  15 mL Mouth Rinse BID   sulfamethoxazole-trimethoprim  2 tablet Oral Once   sulfamethoxazole-trimethoprim  2 tablet Oral  BID   Continuous Infusions:  sodium chloride Stopped (11/03/21 1904)   ampicillin-sulbactam (UNASYN) IV Stopped (11/04/21 0642)   PRN Meds:.acetaminophen, acetaminophen, amisulpride, haloperidol lactate, HYDROmorphone (DILAUDID) injection, LORazepam, oxyCODONE **OR** oxyCODONE, promethazine   SUBJECTIVE: S/p bronch On 3 liters o2 [place] post bronch but now room air Feels well, good appetite No headache, visual changes, rash, n/v/d, abd pain, chest pain, dyspnea Febrile yesterday Hds  No other complaint  Review of Systems: ROS All other ROS was negative, except mentioned above     OBJECTIVE: Vitals:   11/04/21 0830 11/04/21 0840 11/04/21 0850 11/04/21 0900  BP: (!) 109/96 114/75 124/77   Pulse: 88 89 83   Resp: 14 17 (!) 27   Temp:    97.8 F (36.6 C)  TempSrc:    Oral  SpO2: 99% 100% 99%   Weight:      Height:       Body mass index is 16.3 kg/m.  Physical Exam General/constitutional: no distress, pleasant HEENT: Normocephalic, PER, Conj Clear, EOMI, Oropharynx clear Neck supple CV: rrr no mrg Lungs: clear to auscultation, normal respiratory effort Abd: Soft, Nontender Ext: no edema Skin: No Rash Neuro: nonfocal MSK: no peripheral joint swelling/tenderness/warmth; back spines nontender      Lab Results Lab Results  Component Value Date   WBC 3.0 (L) 11/04/2021   HGB 7.2 (L) 11/04/2021   HCT 22.9 (L) 11/04/2021   MCV 98.7 11/04/2021   PLT 173 11/04/2021    Lab Results  Component Value Date   CREATININE  0.81 11/04/2021   BUN 11 11/04/2021   NA 138 11/04/2021   K 3.5 11/04/2021   CL 110 11/04/2021   CO2 23 11/04/2021    Lab Results  Component Value Date   ALT 33 11/02/2021   AST 42 (H) 11/02/2021   ALKPHOS 53 11/02/2021   BILITOT 0.7 11/02/2021      Microbiology: Recent Results (from the past 240 hour(s))  Blood Culture (routine x 2)     Status: None (Preliminary result)   Collection Time: 11/02/21  4:55 PM   Specimen: BLOOD  Result  Value Ref Range Status   Specimen Description   Final    BLOOD BLOOD LEFT FOREARM Performed at [facility], [address] [place], [place] [address]    Special Requests   Final    BOTTLES DRAWN AEROBIC ONLY Blood Culture results may not be optimal due to an inadequate volume of blood received in culture bottles Performed at [facility], [address], [place], [place] [address]    Culture   Final    NO GROWTH 2 DAYS Performed at [facility], [address], [place], [place] [address]    Report Status PENDING  Incomplete  Blood Culture (routine x 2)     Status: None (Preliminary result)   Collection Time: 11/02/21  5:05 PM   Specimen: BLOOD  Result Value Ref Range Status   Specimen Description   Final    BLOOD RIGHT ANTECUBITAL Performed at [facility], [address] [place], [place] [address]    Special Requests   Final    BOTTLES DRAWN AEROBIC AND ANAEROBIC Blood Culture results may not be optimal due to an excessive volume of blood received in culture bottles Performed at [facility], [address], [place], [place] [address]    Culture   Final    NO GROWTH 2 DAYS Performed at [facility], [address] [place], [place] [address]    Report Status PENDING  Incomplete  Resp Panel by RT-PCR (Flu A&B, Covid) Nasopharyngeal Swab     Status: None   Collection Time: 11/02/21  5:25 PM   Specimen: Nasopharyngeal Swab; Nasopharyngeal(NP) swabs in vial transport medium  Result Value Ref Range Status   SARS Coronavirus 2 by RT PCR NEGATIVE NEGATIVE Final    Comment: (NOTE) SARS-CoV-2 target nucleic acids are NOT DETECTED.  The SARS-CoV-2 RNA is generally detectable in upper respiratory specimens during the acute phase of infection. The lowest concentration of SARS-CoV-2 viral copies this assay can detect is 138 copies/mL. A negative result does not preclude SARS-Cov-2 infection and  should not be used as the sole basis for treatment or other patient management decisions. A negative result may occur with  improper specimen collection/handling, submission of specimen other than nasopharyngeal swab, presence of viral mutation(s) within the areas targeted by this assay, and inadequate number of viral copies(<138 copies/mL). A negative result must be combined with clinical observations, patient history, and epidemiological information. The expected result is Negative.  Fact Sheet for Patients:  BloggerCourse.com  Fact Sheet for Healthcare Providers:  SeriousBroker.it  This test is no t yet approved or cleared by the [place] FDA and  has been authorized for detection and/or diagnosis of SARS-CoV-2 by FDA under an Emergency Use Authorization (EUA). This EUA will remain  in effect (meaning this test can be used) for the duration of the COVID-19 declaration under Section 564(b)(1) of the Act,  21 U.S.C.section 360bbb-3(b)(1), unless the authorization is terminated  or revoked sooner.       Influenza A by PCR NEGATIVE NEGATIVE Final   Influenza B by PCR NEGATIVE NEGATIVE Final    Comment: (NOTE) The Xpert Xpress SARS-CoV-2/FLU/RSV plus assay is intended as an aid in the diagnosis of influenza from Nasopharyngeal swab specimens and should not be used as a sole basis for treatment. Nasal washings and aspirates are unacceptable for Xpert Xpress SARS-CoV-2/FLU/RSV testing.  Fact Sheet for Patients: BloggerCourse.comhttps://www.fda.gov/media/152166/download  Fact Sheet for Healthcare Providers: SeriousBroker.ithttps://www.fda.gov/media/152162/download  This test is not yet approved or cleared by the [place] FDA and has been authorized for detection and/or diagnosis of SARS-CoV-2 by FDA under an Emergency Use Authorization (EUA). This EUA will remain in effect (meaning this test can be used) for the duration of the COVID-19 declaration  under Section 564(b)(1) of the Act, 21 U.S.C. section 360bbb-3(b)(1), unless the authorization is terminated or revoked.  Performed at [facility], [address] [place], [place] [address]   Urine Culture     Status: None   Collection Time: 11/02/21  6:26 PM   Specimen: In/Out Cath Urine  Result Value Ref Range Status   Specimen Description   Final    IN/OUT CATH URINE Performed at [facility], [address] [place], [place] [address]    Special Requests   Final    NONE Performed at [facility], [address] [place], [place] [address]    Culture   Final    NO GROWTH Performed at [facility], [address] [place], [place] [address]    Report Status 11/04/2021 FINAL  Final  MRSA Next Gen by PCR, Nasal     Status: None   Collection Time: 11/03/21 12:50 AM   Specimen: Nasal Mucosa; Nasal Swab  Result Value Ref Range Status   MRSA by PCR Next Gen NOT DETECTED NOT DETECTED Final    Comment: (NOTE) The GeneXpert MRSA Assay (FDA approved for NASAL specimens only), is one component of a comprehensive MRSA colonization surveillance program. It is not intended to diagnose MRSA infection nor to guide or monitor treatment for MRSA infections. Test performance is not FDA approved in patients less than 37 years old. Performed at [facility], [address] [place], [place] [address]      Serology:   Imaging: If present, new imagings (plain films, ct scans, and mri) have been personally visualized and interpreted; radiology reports have been reviewed. Decision making incorporated into the Impression / Recommendations.  5/18 cxr Mild diffuse interstitial and ill-defined opacities, potentially related to findings seen on recent CT chest. No confluent consolidation.    5/18 head ct No acute intracranial abnormality.   Diffuse parenchymal atrophy, advanced in relation of the  patient's stated age.   Mild diffuse paranasal sinus disease   Raymondo Bandrung T Zilphia Kozinski, MD [facility] [phone] pager    11/04/2021, 10:27 AM

## 2021-11-04 NOTE — Progress Notes (Signed)
[facility] ADULT ICU REPLACEMENT PROTOCOL   The patient does apply for the [facility] Adult ICU Electrolyte Replacment Protocol based on the criteria listed below:   1.Exclusion criteria: TCTS patients, ECMO patients, and Dialysis patients 2. Is GFR >/= 30 ml/min? Yes.    Patient's GFR today is >60 3. Is SCr </= 2? Yes.   Patient's SCr is 0.81 mg/dL 4. Did SCr increase >/= 0.5 in 24 hours? No. 5.Pt's weight >40kg  Yes.   6. Abnormal electrolyte(s): K+ 3.5  7. Electrolytes replaced per protocol 8.  Call MD STAT for K+ </= 2.5, Phos </= 1, or Mag </= 1 Physician:  n/a  Melvern Banker 11/04/2021 4:17 AM

## 2021-11-05 LAB — BASIC METABOLIC PANEL
Anion gap: 7 (ref 5–15)
BUN: 12 mg/dL (ref 6–20)
CO2: 23 mmol/L (ref 22–32)
Calcium: 8.6 mg/dL — ABNORMAL LOW (ref 8.9–10.3)
Chloride: 109 mmol/L (ref 98–111)
Creatinine, Ser: 0.76 mg/dL (ref 0.61–1.24)
GFR, Estimated: >60 mL/min (ref >60)
Glucose, Bld: 189 mg/dL — ABNORMAL HIGH (ref 70–99)
Potassium: 4.2 mmol/L (ref 3.5–5.1)
Sodium: 139 mmol/L (ref 135–145)

## 2021-11-05 LAB — CBC
HCT: 22.8 % — ABNORMAL LOW (ref 39.0–52.0)
Hemoglobin: 7.3 g/dL — ABNORMAL LOW (ref 13.0–17.0)
MCH: 31.7 pg (ref 26.0–34.0)
MCHC: 32 g/dL (ref 30.0–36.0)
MCV: 99.1 fL (ref 80.0–100.0)
Platelets: 184 10*3/uL (ref 150–400)
RBC: 2.3 MIL/uL — ABNORMAL LOW (ref 4.22–5.81)
RDW: 15.2 % (ref 11.5–15.5)
WBC: 3.8 10*3/uL — ABNORMAL LOW (ref 4.0–10.5)
nRBC: 0 % (ref 0.0–0.2)

## 2021-11-05 MED ORDER — NAPHAZOLINE-GLYCERIN 0.012-0.25 % OP SOLN: 1.0000 [drp] | Freq: Four times a day (QID) | OPHTHALMIC | Status: DC | PRN

## 2021-11-05 MED ORDER — RISPERIDONE 0.25 MG PO TABS: 1.5000 mg | ORAL_TABLET | Freq: Two times a day (BID) | ORAL | Status: DC

## 2021-11-05 MED ORDER — NAPHAZOLINE-GLYCERIN 0.012-0.2 % OP SOLN: 1.0000 [drp] | Freq: Four times a day (QID) | OPHTHALMIC | Status: DC | PRN

## 2021-11-05 MED ORDER — CEFDINIR 300 MG PO CAPS
300.0000 mg | ORAL_CAPSULE | Freq: Two times a day (BID) | ORAL | Status: AC
  Administered 2021-11-05 – 2021-11-07 (×6): 300 mg via ORAL
  Filled 2021-11-05 (×6): qty 1

## 2021-11-05 MED ORDER — BENZTROPINE MESYLATE 0.5 MG PO TABS
0.5000 mg | ORAL_TABLET | Freq: Two times a day (BID) | ORAL | Status: DC
  Administered 2021-11-05 – 2021-11-09 (×8): 0.5 mg via ORAL
  Filled 2021-11-05 (×9): qty 1

## 2021-11-05 MED ORDER — SULFAMETHOXAZOLE-TRIMETHOPRIM 800-160 MG PO TABS
2.0000 | ORAL_TABLET | Freq: Three times a day (TID) | ORAL | Status: DC
  Administered 2021-11-05 – 2021-11-13 (×25): 2 via ORAL
  Filled 2021-11-05 (×27): qty 2

## 2021-11-05 MED ORDER — RISPERIDONE 1 MG PO TABS
2.0000 mg | ORAL_TABLET | Freq: Two times a day (BID) | ORAL | Status: DC
Start: 2021-11-05 — End: 2021-11-08
  Administered 2021-11-05 – 2021-11-08 (×6): 2 mg via ORAL
  Filled 2021-11-05 (×6): qty 2

## 2021-11-05 NOTE — Progress Notes (Signed)
Id brief note   Pending pjp smear/pcr from BAL Clinically improving On room air   A/p Hiv/aids Oroesophageal candida Presumed pjp pna Parainfluenza viral pneumonitis with superimposed cap   -will d/c amp/sulb -continue bactrim ds; 2 tablet po q8hours  -cefdinir for 3 more days -f/u pjp workup -continue fluconazole

## 2021-11-05 NOTE — Progress Notes (Signed)
NAME:  [name], MRN:  [id number], DOB:  09-10-1985, LOS: 3 ADMISSION DATE:  11/02/2021, CONSULTATION DATE:  11/03/21 REFERRING MD:  [facility] CHIEF COMPLAINT:  Pneumonia   History of Present Illness:  Jason Herman is a 36 year old male with HIV who was admitted 5/18 with hypoxemia, fever and altered mentation. Chest radiograph shows diffuse interstitial and ill defined opacities. He has been admitted by hospitalist service for treatment of pneumonia. Infectious disease was consulted and recommended PCCM consult for bronchoscopy for concern of PCP pneumonia.   CT Chest scan 5/8 show sextensive ground glass air-space opacities with cystic/cavitary areas most pronounced in the lower lobes and right upper lobe. Findings concerning for PCP pneumonia per radiology.   LDH 5/9 was 392 and 5/18 285. Fungitell is 480 on 5/9.  Extended respiratory viral panel on 5/8 shows parainfluenza 1 & 3 positivity  Patient is without complaints at this time as he is eating lunch. He is difficult historian. Bronchoscopy procedure was explained to the patient and he agreed move forward for BAL cultures.  Pertinent  Medical History  HIV Smoking and Vaping Use  Significant Hospital Events: Including procedures, antibiotic start and stop dates in addition to other pertinent events   5/18 admitted 5/19 ID and PCCM consulted 5/20 Bronchoscopy and BAL performed  Interim History / Subjective:   No acute events overnight.  Tolerated bronchoscopy well No complaints this morning.  Objective   Blood pressure 105/75, pulse (!) 57, temperature 98 F (36.7 C), temperature source Oral, resp. rate (!) 32, height 6' (1.829 m), weight 54.5 kg, SpO2 99 %.        Intake/Output Summary (Last 24 hours) at 11/05/2021 6301 Last data filed at 11/05/2021 0157 Gross per 24 hour  Intake 807.88 ml  Output 2525 ml  Net -1717.12 ml    Filed Weights   11/02/21 1719 11/03/21 0139  Weight: 54 kg 54.5 kg    Examination: General:  young male, chronically ill appearing, no acute distress HENT: [place]/AT, moist mucous membranes, sclera anicteric Lungs: clear to ausculttaion, no wheezing Cardiovascular: rrr, no murmurs Abdomen: soft, non-tender, non-distended, BS+ Extremities: warm, no edema Neuro: alert and oriented, moving all extremities GU: n/a  Resolved Hospital Problem list     Assessment & Plan:  Impression: 36 year old male with with HIV/AIDS who is admitted with acute hypoxemic respiratory failure with diffuse bilateral airspace opacities and cystic changes concerning for pneumocystis pneumonia vs post-viral inflammatory changes given recent parainfluenza infection on 10/23/21 based on PCR testing.  Has been afebrile since 5/19. Currently being treated with unasyn, fluconazole and Bactrim  Plan: - follow up BAL cultures and PCP PCR - further management per primary team and ID  PCCM to sign off.   Best Practice (right click and "Reselect all SmartList Selections" daily)   Per primary  Labs   CBC: Recent Labs  Lab 11/02/21 1646 11/03/21 0121 11/04/21 0244 11/05/21 0516  WBC 6.1 5.4 3.0* 3.8*  NEUTROABS 4.5  --   --   --   HGB 9.4* 10.5* 7.2* 7.3*  HCT 29.3* 32.4* 22.9* 22.8*  MCV 98.7 98.2 98.7 99.1  PLT 231 124* 173 184     Basic Metabolic Panel: Recent Labs  Lab 11/02/21 1646 11/03/21 0121 11/04/21 0244 11/05/21 0516  NA 135 134* 138 139  K 4.3 4.2 3.5 4.2  CL 104 110 110 109  CO2 21* 19* 23 23  GLUCOSE 106* 103* 93 189*  BUN 16 13 11  12  CREATININE 0.90 0.71 0.81 0.76  CALCIUM 8.7* 8.0* 8.0* 8.6*    GFR: Estimated Creatinine Clearance: 98.4 mL/min (by C-G formula based on SCr of 0.76 mg/dL). Recent Labs  Lab 11/02/21 1646 11/02/21 1846 11/02/21 2235 11/03/21 0121 11/04/21 0244 11/05/21 0516  WBC 6.1  --   --  5.4 3.0* 3.8*  LATICACIDVEN 3.1* 3.0* 1.4 2.4*  --   --      Liver Function Tests: Recent Labs  Lab 11/02/21 1646  AST 42*  ALT 33  ALKPHOS 53  BILITOT  0.7  PROT 7.6  ALBUMIN 2.6*    No results for input(s): LIPASE, AMYLASE in the last 168 hours. Recent Labs  Lab 11/02/21 1646  AMMONIA 24     ABG    Component Value Date/Time   HCO3 22.7 11/02/2021 2245   TCO2 22 03/23/2020 2251   ACIDBASEDEF 3.7 (H) 11/02/2021 2245   O2SAT 79.4 11/02/2021 2245      Coagulation Profile: Recent Labs  Lab 11/02/21 1646  INR 1.2     Cardiac Enzymes: Recent Labs  Lab 11/02/21 1646  CKTOTAL 72     HbA1C: No results found for: HGBA1C  CBG: Recent Labs  Lab 11/03/21 0006 11/04/21 1938  GLUCAP 93 243*     Critical care time: n/a    Freda Jackson, MD [facility] Office: [phone]   See Amion for personal pager PCCM on call pager [phone] until 7pm. Please call Elink 7p-7a. [phone]
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
## 2021-11-05 NOTE — Progress Notes (Signed)
PROGRESS NOTE    QURAN VASCO  VOZ:[id number] DOB: April 16, 1986 DOA: 11/02/2021 PCP: Patient, No Pcp Per (Inactive)   Brief Narrative: 36 year old with past medical history significant for HIV/AIDS with nonadherence with medication, paranoia, oral candidiasis who presented with altered mental status.  Patient was found to be obtunded and hypoxic on admission.  Subsequently he got agitated in the ED and received Geodon.  He was found to have 4 yellow pills  with him which were later identified as clonidine.  He was found to be febrile, hypotensive and hypoxic.  He was recently hospitalized from 5/8 until 5/11 for community-acquired pneumonia. Chest x-ray; mild diffuse interstitial and ill-defined opacities, potentially related to findings seen on recent CT chest.  Assessment & Plan:   Principal Problem:   Acute metabolic encephalopathy Active Problems:   Acute respiratory failure with hypoxia (HCC)   HIV (human immunodeficiency virus infection) ([place])   Oral candidiasis   Sepsis with acute organ dysfunction (HCC)   Hypotension   Aspiration pneumonia (HCC)   Metabolic acidosis  1-Acute Metabolic Encephalopathy: -Encephalopathy likely multifactorial in the setting of clonidine overdose, sepsis from pneumonia, untreated underlying HIV. Complicated by underline psychiatric disorder.  -CT head: No acute intracranial abnormality.  Diffuse parenchymal atrophy, advanced in relation of patient's stated age. -Evaluated by ID, no need for LP at this time.  -He received Narcan on admission  without significant improvement on MS.  -Received Geodon and Ativan subsequently for agitation on admission./  -He is uncooperative at times, agitated. IV Ativan PRN, Haldol PRN ordered.  -Patient with paranoid thought. Psych consulted. Recommend Risperdal and trazodone.   2-Acute Hypoxic Respiratory failure on admission, PNA -He presented with oxygen saturation down to 70%.  He was placed on  oxygen. -Pneumonia, could be aspiration -Currently he is off of oxygen oxygen sat more than 90% -on IV ampicillin change to Cefdinir 5/21 for 3 more days. -He was also started on treatment for PCP pneumonia with Bactrim. -Underwent Bronchoscopy 5/20, follow culture. Pending.   3-Paranoid, Confusion:  Per Psych Differential : Unspecified Psychotic Disorder Vs Schizophrenia Vs Delirium Vs Brief psychotic disorder.  Appreciate Psych evaluation.  -Started on Risperdal 1 mg BID, increase to 2 mg BID on 5/22. -started on Congentin  -Trazodone HS.  -Needs inpatient psych facility admission when medical clear.  -Cant Leave AMA.   Hypotension: Suspect related to clonidine overdose -He was found to have clonidine pills on him with unknown amount of ingestion. -Hypotensive initially received 4 L of IV fluids. -Blood pressure improved.  -Oral candidiasis: Continue with fluconazole.  -HIV/AIDS CD4 count 5/80/2023 was 36. No adherent to medication He was a started on Biktarvy on discharge last admission.  ID consulted plan to hold [place].   Anemia; Anemia panel. : Iron deficiency anemia.Will start iron supplement.  Folic acid deficiency. Start folic acid.   Metabolic acidosis, lactic acidosis.  Lactic acidosis could be related to infection, and hypoperfusion , Hypoxemia.  Improved with IV fluids.   Mild hyponatremia; continue  with IV fluids.   Estimated body mass index is 16.3 kg/m as calculated from the following:   Height as of this encounter: 6' (1.829 m).   Weight as of this encounter: 54.5 kg.   DVT prophylaxis: Lovenox Code Status: Full code Family Communication: care discussed with patient.  Disposition Plan:  Status is: Inpatient Remains inpatient appropriate because: management of infection, PNA    Consultants:  CCM ID  Procedures:    Antimicrobials:    Subjective: He  is alert, denies pain. Speech tangential.   Objective: Vitals:   11/05/21 0100  11/05/21 0200 11/05/21 0300 11/05/21 0400  BP: 109/80 99/65 110/78 105/75  Pulse: 81 73 (!) 59 (!) 57  Resp: (!) _0 (!) 32  Temp:    98 F (36.7 C)  TempSrc:    Oral  SpO2: 99% 95% 100% 99%  Weight:      Height:        Intake/Output Summary (Last 24 hours) at 11/05/2021 0759 Last data filed at 11/05/2021 0157 Gross per 24 hour  Intake 807.88 ml  Output 2525 ml  Net -1717.12 ml    Filed Weights   11/02/21 1719 11/03/21 0139  Weight: 54 kg 54.5 kg    Examination:  General exam: NAD Respiratory system: CTA Cardiovascular system: S 1, S 2 RRR Gastrointestinal system: BS present, soft, nt Central nervous system: alert, more calm   Data Reviewed: I have personally reviewed following labs and imaging studies  CBC: Recent Labs  Lab 11/02/21 1646 11/03/21 0121 11/04/21 0244 11/05/21 0516  WBC 6.1 5.4 3.0* 3.8*  NEUTROABS 4.5  --   --   --   HGB 9.4* 10.5* 7.2* 7.3*  HCT 29.3* 32.4* 22.9* 22.8*  MCV 98.7 98.2 98.7 99.1  PLT 231 124* 173 458    Basic Metabolic Panel: Recent Labs  Lab 11/02/21 1646 11/03/21 0121 11/04/21 0244 11/05/21 0516  NA 135 134* 138 139  K 4.3 4.2 3.5 4.2  CL 104 110 110 109  CO2 21* 19* 23 23  GLUCOSE 106* 103* 93 189*  BUN _1 CREATININE 0.90 0.71 0.81 0.76  CALCIUM 8.7* 8.0* 8.0* 8.6*    GFR: Estimated Creatinine Clearance: 98.4 mL/min (by C-G formula based on SCr of 0.76 mg/dL). Liver Function Tests: Recent Labs  Lab 11/02/21 1646  AST 42*  ALT 33  ALKPHOS 53  BILITOT 0.7  PROT 7.6  ALBUMIN 2.6*    No results for input(s): LIPASE, AMYLASE in the last 168 hours. Recent Labs  Lab 11/02/21 1646  AMMONIA 24    Coagulation Profile: Recent Labs  Lab 11/02/21 1646  INR 1.2    Cardiac Enzymes: Recent Labs  Lab 11/02/21 1646  CKTOTAL 72    BNP (last 3 results) No results for input(s): PROBNP in the last 8760 hours. HbA1C: No results for input(s): HGBA1C in the last 72 hours. CBG: Recent Labs   Lab 11/03/21 0006 11/04/21 1938  GLUCAP 93 243*    Lipid Profile: No results for input(s): CHOL, HDL, LDLCALC, TRIG, CHOLHDL, LDLDIRECT in the last 72 hours. Thyroid Function Tests: No results for input(s): TSH, T4TOTAL, FREET4, T3FREE, THYROIDAB in the last 72 hours. Anemia Panel: Recent Labs    11/04/21 0244  VITAMINB12 1,224*  FOLATE 5.8*  FERRITIN 1,350*  TIBC 156*  IRON 15*  RETICCTPCT 1.0    Sepsis Labs: Recent Labs  Lab 11/02/21 1646 11/02/21 1846 11/02/21 2235 11/03/21 0121  LATICACIDVEN 3.1* 3.0* 1.4 2.4*     Recent Results (from the past 240 hour(s))  Blood Culture (routine x 2)     Status: None (Preliminary result)   Collection Time: 11/02/21  4:55 PM   Specimen: BLOOD  Result Value Ref Range Status   Specimen Description   Final    BLOOD BLOOD LEFT FOREARM Performed at [facility], [place] [address], [place], [place] [address]    Special Requests   Final    BOTTLES DRAWN AEROBIC  ONLY Blood Culture results may not be optimal due to an inadequate volume of blood received in culture bottles Performed at [facility], [place] [address], [place], [place] [address]    Culture   Final    NO GROWTH 2 DAYS Performed at [place] [address], [place], [place] [address]    Report Status PENDING  Incomplete  Blood Culture (routine x 2)     Status: None (Preliminary result)   Collection Time: 11/02/21  5:05 PM   Specimen: BLOOD  Result Value Ref Range Status   Specimen Description   Final    BLOOD RIGHT ANTECUBITAL Performed at [facility] [address] [place], [place] [address]    Special Requests   Final    BOTTLES DRAWN AEROBIC AND ANAEROBIC Blood Culture results may not be optimal due to an excessive volume of blood received in culture bottles Performed at [place] [address] [place], [place] [address]    Culture   Final    NO GROWTH 2 DAYS Performed  at [place] [address] [place],  [address]    Report Status PENDING  Incomplete  Resp Panel by RT-PCR (Flu A&B, Covid) Nasopharyngeal Swab     Status: None   Collection Time: 11/02/21  5:25 PM   Specimen: Nasopharyngeal Swab; Nasopharyngeal(NP) swabs in vial transport medium  Result Value Ref Range Status   SARS Coronavirus 2 by RT PCR NEGATIVE NEGATIVE Final    Comment: (NOTE) SARS-CoV-2 target nucleic acids are NOT DETECTED.  The SARS-CoV-2 RNA is generally detectable in upper respiratory specimens during the acute phase of infection. The lowest concentration of SARS-CoV-2 viral copies this assay can detect is 138 copies/mL. A negative result does not preclude SARS-Cov-2 infection and should not be used as the sole basis for treatment or other patient management decisions. A negative result may occur with  improper specimen collection/handling, submission of specimen other than nasopharyngeal swab, presence of viral mutation(s) within the areas targeted by this assay, and inadequate number of viral copies(<138 copies/mL). A negative result must be combined with clinical observations, patient history, and epidemiological information. The expected result is Negative.  Fact Sheet for Patients:  EntrepreneurPulse.com.au  Fact Sheet for Healthcare Providers:  IncredibleEmployment.be  This test is no t yet approved or cleared by the [place] FDA and  has been authorized for detection and/or diagnosis of SARS-CoV-2 by FDA under an Emergency Use Authorization (EUA). This EUA will remain  in effect (meaning this test can be used) for the duration of the COVID-19 declaration under Section 564(b)(1) of the Act, 21 U.S.C.section 360bbb-3(b)(1), unless the authorization is terminated  or revoked sooner.       Influenza A by PCR NEGATIVE NEGATIVE Final   Influenza B by PCR NEGATIVE NEGATIVE Final    Comment: (NOTE) The  Xpert Xpress SARS-CoV-2/FLU/RSV plus assay is intended as an aid in the diagnosis of influenza from Nasopharyngeal swab specimens and should not be used as a sole basis for treatment. Nasal washings and aspirates are unacceptable for Xpert Xpress SARS-CoV-2/FLU/RSV testing.  Fact Sheet for Patients: EntrepreneurPulse.com.au  Fact Sheet for Healthcare Providers: IncredibleEmployment.be  This test is not yet approved or cleared by the [place] FDA and has been authorized for detection and/or diagnosis of SARS-CoV-2 by FDA under an Emergency Use Authorization (EUA). This EUA will remain in effect (meaning this test can be used) for the duration of the  COVID-19 declaration under Section 564(b)(1) of the Act, 21 U.S.C. section 360bbb-3(b)(1), unless the authorization is terminated or revoked.  Performed at [facility], [place] [address] [place], [place] [address]   Urine Culture     Status: None   Collection Time: 11/02/21  6:26 PM   Specimen: In/Out Cath Urine  Result Value Ref Range Status   Specimen Description   Final    IN/OUT CATH URINE Performed at [place] [address], [place], [place] [address]    Special Requests   Final    NONE Performed at [facility], [place] [address] [place], [place] [address]    Culture   Final    NO GROWTH Performed at [facility], [place] [address], [place], [place] [address]    Report Status 11/04/2021 FINAL  Final  MRSA Next Gen by PCR, Nasal     Status: None   Collection Time: 11/03/21 12:50 AM   Specimen: Nasal Mucosa; Nasal Swab  Result Value Ref Range Status   MRSA by PCR Next Gen NOT DETECTED NOT DETECTED Final    Comment: (NOTE) The GeneXpert MRSA Assay (FDA approved for NASAL specimens only), is one component of a comprehensive MRSA colonization surveillance program. It is not intended to diagnose MRSA infection nor to  guide or monitor treatment for MRSA infections. Test performance is not FDA approved in patients less than 71 years old. Performed at [facility], [place] [address], [place], [place] [address]   Culture, Respiratory w Gram Stain     Status: None (Preliminary result)   Collection Time: 11/04/21  8:20 AM   Specimen: Bronchoalveolar Lavage; Respiratory  Result Value Ref Range Status   Specimen Description   Final    BRONCHIAL ALVEOLAR LAVAGE Performed at [place] [address], [place], [place] [address]    Special Requests   Final    Immunocompromised Performed at [facility], [place] [address], [place], [place] [address]    Gram Stain   Final    WBC PRESENT, PREDOMINANTLY MONONUCLEAR SQUAMOUS EPITHELIAL CELLS PRESENT NO ORGANISMS SEEN CYTOSPIN SMEAR Performed at [facility], [place] [address], [place], [place] [address]    Culture PENDING  Incomplete   Report Status PENDING  Incomplete          Radiology Studies: No results found.      Scheduled Meds:  Chlorhexidine Gluconate Cloth  6 each Topical Daily   enoxaparin (LOVENOX) injection  40 mg Subcutaneous Q24H   ferrous sulfate  325 mg Oral Q breakfast   fluconazole  200 mg Oral Daily   folic acid  1 mg Oral Daily   mouth rinse  15 mL Mouth Rinse BID   risperiDONE  1 mg Oral BID   sulfamethoxazole-trimethoprim  2 tablet Oral Once   sulfamethoxazole-trimethoprim  2 tablet Oral BID   traZODone  50 mg Oral QHS   Continuous Infusions:  sodium chloride 75 mL/hr at 11/04/21 1629   ampicillin-sulbactam (UNASYN) IV Stopped (11/05/21 0726)     LOS: 3 days    Time spent: 35 minutes    Saudia Smyser A Josephina Melcher, MD [facility]   If 7PM-7AM, please contact night-coverage [email or web address]  11/05/2021, 7:59 AM

## 2021-11-06 ENCOUNTER — Inpatient Hospital Stay: Payer: Self-pay | Admitting: Nurse Practitioner

## 2021-11-06 ENCOUNTER — Encounter (HOSPITAL_COMMUNITY): Payer: Self-pay | Admitting: Pulmonary Disease

## 2021-11-06 DIAGNOSIS — G9340 Encephalopathy, unspecified: Secondary | ICD-10-CM

## 2021-11-06 LAB — CULTURE, RESPIRATORY W GRAM STAIN: Culture: NO GROWTH

## 2021-11-06 LAB — HIV GENOSURE(R) MG

## 2021-11-06 LAB — HIV-1 RNA, PCR (GRAPH) RFX/GENO EDI
HIV-1 RNA BY PCR: 94600 copies/mL
HIV-1 RNA Quant, Log: 4.976 log10copy/mL

## 2021-11-06 LAB — CBC
HCT: 25.3 % — ABNORMAL LOW (ref 39.0–52.0)
Hemoglobin: 7.9 g/dL — ABNORMAL LOW (ref 13.0–17.0)
MCH: 31.3 pg (ref 26.0–34.0)
MCHC: 31.2 g/dL (ref 30.0–36.0)
MCV: 100.4 fL — ABNORMAL HIGH (ref 80.0–100.0)
Platelets: 208 10*3/uL (ref 150–400)
RBC: 2.52 MIL/uL — ABNORMAL LOW (ref 4.22–5.81)
RDW: 15.8 % — ABNORMAL HIGH (ref 11.5–15.5)
WBC: 3 10*3/uL — ABNORMAL LOW (ref 4.0–10.5)
nRBC: 0 % (ref 0.0–0.2)

## 2021-11-06 LAB — REFLEX TO GENOSURE(R) MG EDI: HIV GenoSure(R): 1

## 2021-11-06 LAB — BASIC METABOLIC PANEL
Anion gap: 8 (ref 5–15)
BUN: 8 mg/dL (ref 6–20)
CO2: 21 mmol/L — ABNORMAL LOW (ref 22–32)
Calcium: 8.6 mg/dL — ABNORMAL LOW (ref 8.9–10.3)
Chloride: 107 mmol/L (ref 98–111)
Creatinine, Ser: 0.89 mg/dL (ref 0.61–1.24)
GFR, Estimated: >60 mL/min (ref >60)
Glucose, Bld: 80 mg/dL (ref 70–99)
Potassium: 4.5 mmol/L (ref 3.5–5.1)
Sodium: 136 mmol/L (ref 135–145)

## 2021-11-06 LAB — HIV GENOSURE REFLEX - HIVGTY - ELECTRONIC RECORD

## 2021-11-06 LAB — GENOSURE INTEGRASE HIV EDI: HIV Genosure Integrase PDF 2: 1

## 2021-11-06 MED ORDER — SODIUM CHLORIDE 0.9 % IV BOLUS
1000.0000 mL | Freq: Once | INTRAVENOUS | Status: AC
  Administered 2021-11-06: 1000 mL via INTRAVENOUS

## 2021-11-06 NOTE — Progress Notes (Signed)
PROGRESS NOTE    Jason Herman  YIF:[id number] DOB: 07/06/1985 DOA: 11/02/2021 PCP: Patient, No Pcp Per (Inactive)   Brief Narrative: 36 year old with past medical history significant for HIV/AIDS with nonadherence with medication, paranoia, oral candidiasis who presented with altered mental status.  Patient was found to be obtunded and hypoxic on admission.  Subsequently he got agitated in the ED and received Geodon.  He was found to have 4 yellow pills  with him which were later identified as clonidine.  He was found to be febrile, hypotensive and hypoxic.  He was recently hospitalized from 5/8 until 5/11 for community-acquired pneumonia. Chest x-ray; mild diffuse interstitial and ill-defined opacities, potentially related to findings seen on recent CT chest.  Assessment & Plan:   Principal Problem:   Acute metabolic encephalopathy Active Problems:   Acute respiratory failure with hypoxia (HCC)   HIV (human immunodeficiency virus infection) ([place])   Oral candidiasis   Sepsis with acute organ dysfunction (HCC)   Hypotension   Aspiration pneumonia (HCC)   Metabolic acidosis  1-Acute Metabolic Encephalopathy: -Encephalopathy likely multifactorial in the setting of clonidine overdose, sepsis from pneumonia, untreated underlying HIV. Complicated by underline psychiatric disorder.  -CT head: No acute intracranial abnormality.  Diffuse parenchymal atrophy, advanced in relation of patient's stated age. -Evaluated by ID, no need for LP at this time.  -He received Narcan on admission  without significant improvement on MS.  -Received Geodon and Ativan subsequently for agitation on admission./  -Patient with paranoid thought. Psych consulted. Recommend Risperdal and trazodone.  Improving.   2-Acute Hypoxic Respiratory failure on admission, PNA -He presented with oxygen saturation down to 70%.  He was placed on oxygen. -Pneumonia, could be aspiration -Currently he is off of oxygen oxygen  sat more than 90% -on IV ampicillin change to Cefdinir 5/21 for 3 more days. -He was also started on treatment for PCP pneumonia with Bactrim. -Underwent Bronchoscopy 5/20, follow culture. Pending.  Pneumocystis  smear pending, fungus, acid fast culture, pneumocystis PCR pending.   3-Paranoid, Confusion:  Per Psych Differential : Unspecified Psychotic Disorder Vs Schizophrenia Vs Delirium Vs Brief psychotic disorder.  Appreciate Psych evaluation.  -Started on Risperdal 1 mg BID, increase to 2 mg BID on 5/22. -Started on Congentin  -Trazodone HS.  -Needs inpatient psych facility admission when medical clear.  -Cant Leave AMA.   Hypotension: Suspect related to clonidine overdose -He was found to have clonidine pills on him with unknown amount of ingestion. -Hypotensive initially received 4 L of IV fluids. -Blood pressure improved.  -Oral candidiasis: Continue with fluconazole.  -HIV/AIDS CD4 count 5/80/2023 was 36. No adherent to medication He was a started on Biktarvy on discharge last admission.  ID consulted plan to hold [place].   Anemia; Anemia panel. : Iron deficiency anemia. Started  iron supplement.  Folic acid deficiency. Started folic acid.   Tachycardia; IV bolus, check EKG  Metabolic acidosis, lactic acidosis.  Lactic acidosis could be related to infection, and hypoperfusion , Hypoxemia.  Improved with IV fluids.   Mild hyponatremia; continue  with IV fluids.   Estimated body mass index is 16.3 kg/m as calculated from the following:   Height as of this encounter: 6' (1.829 m).   Weight as of this encounter: 54.5 kg.   DVT prophylaxis: Lovenox Code Status: Full code Family Communication: care discussed with patient.  Disposition Plan:  Status is: Inpatient Remains inpatient appropriate because: management of infection, PNA    Consultants:  CCM ID  Procedures:  Antimicrobials:    Subjective: He is more clam , he is answering questions. He  relates he lives with an Architectural technologist.   Objective: Vitals:   11/05/21 1109 11/05/21 1138 11/06/21 0647 11/06/21 1046  BP: 114/75 123/70 98/63 (!) 97/59  Pulse: 96 93 (!) 107 100  Resp: _0 Temp: 97.9 F (36.6 C) 97.8 F (36.6 C) 99.4 F (37.4 C)   TempSrc: Oral Oral Oral Oral  SpO2: 98% 98% 96% 99%  Weight:      Height:        Intake/Output Summary (Last 24 hours) at 11/06/2021 1311 Last data filed at 11/06/2021 0900 Gross per 24 hour  Intake 1275.09 ml  Output 2200 ml  Net -924.91 ml    Filed Weights   11/02/21 1719 11/03/21 0139  Weight: 54 kg 54.5 kg    Examination:  General exam: NAD Respiratory system: CTA Cardiovascular system: S 1, S 2 RRR Gastrointestinal system: BS present, soft, nt Central nervous system: Alert, follows command  Data Reviewed: I have personally reviewed following labs and imaging studies  CBC: Recent Labs  Lab 11/02/21 1646 11/03/21 0121 11/04/21 0244 11/05/21 0516 11/06/21 0612  WBC 6.1 5.4 3.0* 3.8* 3.0*  NEUTROABS 4.5  --   --   --   --   HGB 9.4* 10.5* 7.2* 7.3* 7.9*  HCT 29.3* 32.4* 22.9* 22.8* 25.3*  MCV 98.7 98.2 98.7 99.1 100.4*  PLT 231 124* 173 184 891    Basic Metabolic Panel: Recent Labs  Lab 11/02/21 1646 11/03/21 0121 11/04/21 0244 11/05/21 0516 11/06/21 0612  NA 135 134* 138 139 136  K 4.3 4.2 3.5 4.2 4.5  CL 104 110 110 109 107  CO2 21* 19* 23 23 21*  GLUCOSE 106* 103* 93 189* 80  BUN _1 CREATININE 0.90 0.71 0.81 0.76 0.89  CALCIUM 8.7* 8.0* 8.0* 8.6* 8.6*    GFR: Estimated Creatinine Clearance: 88.5 mL/min (by C-G formula based on SCr of 0.89 mg/dL). Liver Function Tests: Recent Labs  Lab 11/02/21 1646  AST 42*  ALT 33  ALKPHOS 53  BILITOT 0.7  PROT 7.6  ALBUMIN 2.6*    No results for input(s): LIPASE, AMYLASE in the last 168 hours. Recent Labs  Lab 11/02/21 1646  AMMONIA 24    Coagulation Profile: Recent Labs  Lab 11/02/21 1646  INR 1.2    Cardiac  Enzymes: Recent Labs  Lab 11/02/21 1646  CKTOTAL 72    BNP (last 3 results) No results for input(s): PROBNP in the last 8760 hours. HbA1C: No results for input(s): HGBA1C in the last 72 hours. CBG: Recent Labs  Lab 11/03/21 0006 11/04/21 1938  GLUCAP 93 243*    Lipid Profile: No results for input(s): CHOL, HDL, LDLCALC, TRIG, CHOLHDL, LDLDIRECT in the last 72 hours. Thyroid Function Tests: No results for input(s): TSH, T4TOTAL, FREET4, T3FREE, THYROIDAB in the last 72 hours. Anemia Panel: Recent Labs    11/04/21 0244  VITAMINB12 1,224*  FOLATE 5.8*  FERRITIN 1,350*  TIBC 156*  IRON 15*  RETICCTPCT 1.0    Sepsis Labs: Recent Labs  Lab 11/02/21 1646 11/02/21 1846 11/02/21 2235 11/03/21 0121  LATICACIDVEN 3.1* 3.0* 1.4 2.4*     Recent Results (from the past 240 hour(s))  Blood Culture (routine x 2)     Status: None (Preliminary result)   Collection Time: 11/02/21  4:55 PM   Specimen: BLOOD  Result Value Ref Range Status   Specimen  Description   Final    BLOOD BLOOD LEFT FOREARM Performed at [facility] [address], [place], [place] [address]    Special Requests   Final    BOTTLES DRAWN AEROBIC ONLY Blood Culture results may not be optimal due to an inadequate volume of blood received in culture bottles Performed at [facility] [address], [place], [place] [address]    Culture   Final    NO GROWTH 4 DAYS Performed at [facility], [place] [address], [place], [place] [address]    Report Status PENDING  Incomplete  Blood Culture (routine x 2)     Status: None (Preliminary result)   Collection Time: 11/02/21  5:05 PM   Specimen: BLOOD  Result Value Ref Range Status   Specimen Description   Final    BLOOD RIGHT ANTECUBITAL Performed at [facility] [address], [place], [place] [address]    Special Requests   Final    BOTTLES DRAWN AEROBIC AND ANAEROBIC Blood Culture results may  not be optimal due to an excessive volume of blood received in culture bottles Performed at [facility] [address] [place], [place] [address]    Culture   Final    NO GROWTH 4 DAYS Performed at [facility], [place] [address] [place], [place] [address]    Report Status PENDING  Incomplete  Resp Panel by RT-PCR (Flu A&B, Covid) Nasopharyngeal Swab     Status: None   Collection Time: 11/02/21  5:25 PM   Specimen: Nasopharyngeal Swab; Nasopharyngeal(NP) swabs in vial transport medium  Result Value Ref Range Status   SARS Coronavirus 2 by RT PCR NEGATIVE NEGATIVE Final    Comment: (NOTE) SARS-CoV-2 target nucleic acids are NOT DETECTED.  The SARS-CoV-2 RNA is generally detectable in upper respiratory specimens during the acute phase of infection. The lowest concentration of SARS-CoV-2 viral copies this assay can detect is 138 copies/mL. A negative result does not preclude SARS-Cov-2 infection and should not be used as the sole basis for treatment or other patient management decisions. A negative result may occur with  improper specimen collection/handling, submission of specimen other than nasopharyngeal swab, presence of viral mutation(s) within the areas targeted by this assay, and inadequate number of viral copies(<138 copies/mL). A negative result must be combined with clinical observations, patient history, and epidemiological information. The expected result is Negative.  Fact Sheet for Patients:  EntrepreneurPulse.com.au  Fact Sheet for Healthcare Providers:  IncredibleEmployment.be  This test is no t yet approved or cleared by the [place] FDA and  has been authorized for detection and/or diagnosis of SARS-CoV-2 by FDA under an Emergency Use Authorization (EUA). This EUA will remain  in effect (meaning this test can be used) for the duration of the COVID-19 declaration under Section 564(b)(1) of the Act,  21 U.S.C.section 360bbb-3(b)(1), unless the authorization is terminated  or revoked sooner.       Influenza A by PCR NEGATIVE NEGATIVE Final   Influenza B by PCR NEGATIVE NEGATIVE Final    Comment: (NOTE) The Xpert Xpress SARS-CoV-2/FLU/RSV plus assay is intended as an aid in the diagnosis of influenza from Nasopharyngeal swab specimens and should not be used as a sole basis for treatment. Nasal washings and aspirates are unacceptable for Xpert Xpress SARS-CoV-2/FLU/RSV testing.  Fact Sheet for Patients: EntrepreneurPulse.com.au  Fact Sheet for Healthcare Providers: IncredibleEmployment.be  This test is not yet approved or cleared by the  [place] Architectural technologist and has been authorized for detection and/or diagnosis of SARS-CoV-2 by FDA under an Print production planner (EUA). This EUA will remain in effect (meaning this test can be used) for the duration of the COVID-19 declaration under Section 564(b)(1) of the Act, 21 U.S.C. section 360bbb-3(b)(1), unless the authorization is terminated or revoked.  Performed at [facility], [place] [address] [place], [place] [address]   Urine Culture     Status: None   Collection Time: 11/02/21  6:26 PM   Specimen: In/Out Cath Urine  Result Value Ref Range Status   Specimen Description   Final    IN/OUT CATH URINE Performed at [facility] [address], [place], [place] [address]    Special Requests   Final    NONE Performed at [facility], [place] [address], [place], [place] [address]    Culture   Final    NO GROWTH Performed at [facility], [place] [address], [place], [place] [address]    Report Status 11/04/2021 FINAL  Final  MRSA Next Gen by PCR, Nasal     Status: None   Collection Time: 11/03/21 12:50 AM   Specimen: Nasal Mucosa; Nasal Swab  Result Value Ref Range Status   MRSA by PCR Next Gen NOT DETECTED NOT DETECTED Final     Comment: (NOTE) The GeneXpert MRSA Assay (FDA approved for NASAL specimens only), is one component of a comprehensive MRSA colonization surveillance program. It is not intended to diagnose MRSA infection nor to guide or monitor treatment for MRSA infections. Test performance is not FDA approved in patients less than 76 years old. Performed at [facility], [place] [address] [place], [place] [address]   Culture, Respiratory w Gram Stain     Status: None   Collection Time: 11/04/21  8:20 AM   Specimen: Bronchoalveolar Lavage; Respiratory  Result Value Ref Range Status   Specimen Description   Final    BRONCHIAL ALVEOLAR LAVAGE Performed at [facility] [address] [place], [place] [address]    Special Requests   Final    Immunocompromised Performed at [facility], [place] [address], [place], [place] [address]    Gram Stain   Final    WBC PRESENT, PREDOMINANTLY MONONUCLEAR SQUAMOUS EPITHELIAL CELLS PRESENT NO ORGANISMS SEEN CYTOSPIN SMEAR    Culture   Final    NO GROWTH 2 DAYS Performed at [facility], [place] [address], [place],  [address]    Report Status 11/06/2021 FINAL  Final          Radiology Studies: No results found.      Scheduled Meds:  benztropine  0.5 mg Oral Q12H   cefdinir  300 mg Oral Q12H   Chlorhexidine Gluconate Cloth  6 each Topical Daily   enoxaparin (LOVENOX) injection  40 mg Subcutaneous Q24H   ferrous sulfate  325 mg Oral Q breakfast   fluconazole  200 mg Oral Daily   folic acid  1 mg Oral Daily   mouth rinse  15 mL Mouth Rinse BID   risperiDONE  2 mg Oral BID   sulfamethoxazole-trimethoprim  2 tablet Oral Q8H   traZODone  50 mg Oral QHS   Continuous Infusions:  sodium chloride 75 mL/hr at 11/06/21 1305   sodium chloride       LOS: 4 days    Time spent: 35 minutes    Elmarie Shiley, MD  [facility]   If 7PM-7AM, please contact  night-coverage [email or web address]  11/06/2021, 1:11 PM

## 2021-11-06 NOTE — Progress Notes (Signed)
   11/06/21 0647  Assess: MEWS Score  Temp 99.4 F (37.4 C)  BP 98/63  Pulse Rate (!) 107  SpO2 96 %  O2 Device Room Air  Assess: MEWS Score  MEWS Temp 0  MEWS Systolic 1  MEWS Pulse 1  MEWS RR 0  MEWS LOC 0  MEWS Score 2  MEWS Score Color Yellow  Assess: if the MEWS score is Yellow or Red  Were vital signs taken at a resting state? Yes  Focused Assessment No change from prior assessment  Does the patient meet 2 or more of the SIRS criteria? No  MEWS guidelines implemented *See Row Information* No, previously yellow, continue vital signs every 4 hours  Assess: SIRS CRITERIA  SIRS Temperature  0  SIRS Pulse 1  SIRS Respirations  0  SIRS WBC 0  SIRS Score Sum  1   Pt has previously been yellow MEWS for tachycardia. Continue to monitor. Mick Sell RN

## 2021-11-06 NOTE — Progress Notes (Signed)
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 Recent fungitel elevated. Improved on CAP coverage but readmitted found down/confused on the street and fever still at this readmission. Pjp infection suspicion higher  Ams resolved. Has been on room air Crypto ag negative  Lab Results  Component Value Date   CD4TCELL 8 (L) 10/23/2021   CD4TABS 36 (L) 10/23/2021   Lab Results  Component Value Date   HIV1RNAQUANT 119,000 10/24/2021   5/18 bcx ngtd  ---------- 5/22 assessment Doing well; had transitioned to oral cap coverage Fever resolved Pjp smear/pcr still in progress No other sign of OI currently  Psych seeing   PLAN: Continue cefdinir 2 more days and current empiric pjp treatment F/u pjp workup Appreciate psych evaluation -- definitely needs inpatient psych treatment it appears Hold biktarvy still Discussed with primary team  I spent more than 35 minute reviewing data/chart, and coordinating care and >50% direct face to face time providing counseling/discussing diagnostics/treatment plan with patient    Principal Problem:   Acute metabolic encephalopathy Active Problems:   HIV (human immunodeficiency virus infection) ([place])   Oral candidiasis   Sepsis with acute organ dysfunction ([place])   Acute respiratory failure with hypoxia (HCC)   Hypotension   Aspiration pneumonia (HCC)   Metabolic acidosis   No Known Allergies  Scheduled Meds:  benztropine  0.5 mg Oral Q12H   cefdinir  300 mg Oral Q12H   Chlorhexidine Gluconate Cloth  6 each Topical Daily   enoxaparin (LOVENOX) injection  40 mg Subcutaneous Q24H   ferrous sulfate  325 mg Oral Q breakfast   fluconazole  200 mg Oral Daily    folic acid  1 mg Oral Daily   mouth rinse  15 mL Mouth Rinse BID   risperiDONE  2 mg Oral BID   sulfamethoxazole-trimethoprim  2 tablet Oral Q8H   traZODone  50 mg Oral QHS   Continuous Infusions:  sodium chloride 75 mL/hr at 11/06/21 1305   sodium chloride     PRN Meds:.acetaminophen, acetaminophen, guaiFENesin, haloperidol lactate, naphazoline-glycerin   SUBJECTIVE: No complaint Fever resolved Pjp labs pending No n/v/diarrhea/rash, joint pain, headache, visual changes  Dry cough still there  Review of Systems: ROS All other ROS was negative, except mentioned above     OBJECTIVE: Vitals:   11/05/21 1109 11/05/21 1138 11/06/21 0647 11/06/21 1046  BP: 114/75 123/70 98/63 (!) 97/59  Pulse: 96 93 (!) 107 100  Resp: _0 Temp: 97.9 F (36.6 C) 97.8 F (36.6 C) 99.4 F (37.4 C)   TempSrc: Oral Oral Oral Oral  SpO2: 98% 98% 96% 99%  Weight:      Height:       Body mass index is 16.3 kg/m.  Physical Exam General/constitutional: no distress, pleasant HEENT: Normocephalic, PER, Conj Clear, EOMI, Oropharynx clear Neck supple CV: rrr no mrg Lungs: clear to auscultation, normal respiratory effort Abd: Soft, Nontender Ext: no edema Skin: No Rash Neuro: nonfocal       Lab Results Lab Results  Component Value Date   WBC 3.0 (L) 11/06/2021   HGB 7.9 (L) 11/06/2021   HCT 25.3 (L) 11/06/2021   MCV 100.4 (H) 11/06/2021   PLT 208 11/06/2021  Lab Results  Component Value Date   CREATININE 0.89 11/06/2021   BUN 8 11/06/2021   NA 136 11/06/2021   K 4.5 11/06/2021   CL 107 11/06/2021   CO2 21 (L) 11/06/2021    Lab Results  Component Value Date   ALT 33 11/02/2021   AST 42 (H) 11/02/2021   ALKPHOS 53 11/02/2021   BILITOT 0.7 11/02/2021      Microbiology: Recent Results (from the past 240 hour(s))  Blood Culture (routine x 2)     Status: None (Preliminary result)   Collection Time: 11/02/21  4:55 PM   Specimen: BLOOD  Result Value Ref Range  Status   Specimen Description   Final    BLOOD BLOOD LEFT FOREARM Performed at [facility], [place] [address], [place], [place] [address]    Special Requests   Final    BOTTLES DRAWN AEROBIC ONLY Blood Culture results may not be optimal due to an inadequate volume of blood received in culture bottles Performed at [place] [address], [place], [place] [address]    Culture   Final    NO GROWTH 4 DAYS Performed at [facility], [place] [address] [place], [place] [address]    Report Status PENDING  Incomplete  Blood Culture (routine x 2)     Status: None (Preliminary result)   Collection Time: 11/02/21  5:05 PM   Specimen: BLOOD  Result Value Ref Range Status   Specimen Description   Final    BLOOD RIGHT ANTECUBITAL Performed at [facility] [address], [place], [place] [address]    Special Requests   Final    BOTTLES DRAWN AEROBIC AND ANAEROBIC Blood Culture results may not be optimal due to an excessive volume of blood received in culture bottles Performed at [place] [address] [place], [place] [address]    Culture   Final    NO GROWTH 4 DAYS Performed at [facility], [place] [address] [place], [place] [address]    Report Status PENDING  Incomplete  Resp Panel by RT-PCR (Flu A&B, Covid) Nasopharyngeal Swab     Status: None   Collection Time: 11/02/21  5:25 PM   Specimen: Nasopharyngeal Swab; Nasopharyngeal(NP) swabs in vial transport medium  Result Value Ref Range Status   SARS Coronavirus 2 by RT PCR NEGATIVE NEGATIVE Final    Comment: (NOTE) SARS-CoV-2 target nucleic acids are NOT DETECTED.  The SARS-CoV-2 RNA is generally detectable in upper respiratory specimens during the acute phase of infection. The lowest concentration of SARS-CoV-2 viral copies this assay can detect is 138 copies/mL. A negative result does not preclude SARS-Cov-2 infection and should not be  used as the sole basis for treatment or other patient management decisions. A negative result may occur with  improper specimen collection/handling, submission of specimen other than nasopharyngeal swab, presence of viral mutation(s) within the areas targeted by this assay, and inadequate number of viral copies(<138 copies/mL). A negative result must be combined with clinical observations, patient history, and epidemiological information. The expected result is Negative.  Fact Sheet for Patients:  EntrepreneurPulse.com.au  Fact Sheet for Healthcare Providers:  IncredibleEmployment.be  This test is no t yet approved or cleared by the [place] FDA and  has been authorized for detection and/or diagnosis of SARS-CoV-2 by FDA under an Emergency Use Authorization (EUA). This EUA will remain  in effect (meaning this test can be used) for the duration  of the COVID-19 declaration under Section 564(b)(1) of the Act, 21 U.S.C.section 360bbb-3(b)(1), unless the authorization is terminated  or revoked sooner.       Influenza A by PCR NEGATIVE NEGATIVE Final   Influenza B by PCR NEGATIVE NEGATIVE Final    Comment: (NOTE) The Xpert Xpress SARS-CoV-2/FLU/RSV plus assay is intended as an aid in the diagnosis of influenza from Nasopharyngeal swab specimens and should not be used as a sole basis for treatment. Nasal washings and aspirates are unacceptable for Xpert Xpress SARS-CoV-2/FLU/RSV testing.  Fact Sheet for Patients: EntrepreneurPulse.com.au  Fact Sheet for Healthcare Providers: IncredibleEmployment.be  This test is not yet approved or cleared by the [place] FDA and has been authorized for detection and/or diagnosis of SARS-CoV-2 by FDA under an Emergency Use Authorization (EUA). This EUA will remain in effect (meaning this test can be used) for the duration of the COVID-19 declaration under Section  564(b)(1) of the Act, 21 U.S.C. section 360bbb-3(b)(1), unless the authorization is terminated or revoked.  Performed at [facility], [place] [address] [place], [place] [address]   Urine Culture     Status: None   Collection Time: 11/02/21  6:26 PM   Specimen: In/Out Cath Urine  Result Value Ref Range Status   Specimen Description   Final    IN/OUT CATH URINE Performed at [place] [address] [place], [place] [address]    Special Requests   Final    NONE Performed at [facility], [place] [address] [place], [place] [address]    Culture   Final    NO GROWTH Performed at [facility], [place] [address], [place], [place] [address]    Report Status 11/04/2021 FINAL  Final  MRSA Next Gen by PCR, Nasal     Status: None   Collection Time: 11/03/21 12:50 AM   Specimen: Nasal Mucosa; Nasal Swab  Result Value Ref Range Status   MRSA by PCR Next Gen NOT DETECTED NOT DETECTED Final    Comment: (NOTE) The GeneXpert MRSA Assay (FDA approved for NASAL specimens only), is one component of a comprehensive MRSA colonization surveillance program. It is not intended to diagnose MRSA infection nor to guide or monitor treatment for MRSA infections. Test performance is not FDA approved in patients less than 39 years old. Performed at [facility], [place] [address], [place], [place] [address]   Culture, Respiratory w Gram Stain     Status: None   Collection Time: 11/04/21  8:20 AM   Specimen: Bronchoalveolar Lavage; Respiratory  Result Value Ref Range Status   Specimen Description   Final    BRONCHIAL ALVEOLAR LAVAGE Performed at [place] [address] [place], [place] [address]    Special Requests   Final    Immunocompromised Performed at [facility], [place] [address], [place], [place] [address]    Gram Stain   Final    WBC PRESENT, PREDOMINANTLY  MONONUCLEAR SQUAMOUS EPITHELIAL CELLS PRESENT NO ORGANISMS SEEN CYTOSPIN SMEAR    Culture   Final    NO GROWTH 2 DAYS Performed at [facility], [place] [address], [place], [place] [address]    Report Status 11/06/2021 FINAL  Final     Serology:   Imaging: If present, new imagings (plain films, ct scans, and mri) have been personally visualized and interpreted; radiology reports have been reviewed. Decision making incorporated into the Impression / Recommendations.  5/18 cxr  Mild diffuse interstitial and ill-defined opacities, potentially related to findings seen on recent CT chest. No confluent consolidation.    5/18 head ct No acute intracranial abnormality.   Diffuse parenchymal atrophy, advanced in relation of the patient's stated age.   Mild diffuse paranasal sinus disease   Jabier Mutton, [place] for Infectious Disease [place] [phone] pager    11/06/2021, 1:31 PM

## 2021-11-07 ENCOUNTER — Other Ambulatory Visit (HOSPITAL_COMMUNITY): Payer: Self-pay

## 2021-11-07 DIAGNOSIS — Z2989 Encounter for other specified prophylactic measures: Secondary | ICD-10-CM

## 2021-11-07 DIAGNOSIS — B59 Pneumocystosis: Secondary | ICD-10-CM

## 2021-11-07 LAB — CULTURE, BLOOD (ROUTINE X 2)
Culture: NO GROWTH
Culture: NO GROWTH

## 2021-11-07 LAB — CBC
HCT: 26.5 % — ABNORMAL LOW (ref 39.0–52.0)
Hemoglobin: 8.6 g/dL — ABNORMAL LOW (ref 13.0–17.0)
MCH: 32.2 pg (ref 26.0–34.0)
MCHC: 32.5 g/dL (ref 30.0–36.0)
MCV: 99.3 fL (ref 80.0–100.0)
Platelets: 240 10*3/uL (ref 150–400)
RBC: 2.67 MIL/uL — ABNORMAL LOW (ref 4.22–5.81)
RDW: 16.3 % — ABNORMAL HIGH (ref 11.5–15.5)
WBC: 3.7 10*3/uL — ABNORMAL LOW (ref 4.0–10.5)
nRBC: 0 % (ref 0.0–0.2)

## 2021-11-07 LAB — PNEUMOCYSTIS JIROVECI SMEAR BY DFA: Pneumocystis jiroveci Ag: POSITIVE

## 2021-11-07 LAB — TSH: TSH: 1.173 u[IU]/mL (ref 0.350–4.500)

## 2021-11-07 LAB — CYTOLOGY - NON PAP

## 2021-11-07 MED ORDER — BICTEGRAVIR-EMTRICITAB-TENOFOV 50-200-25 MG PO TABS
1.0000 | ORAL_TABLET | Freq: Every day | ORAL | Status: DC
  Administered 2021-11-07 – 2021-11-13 (×7): 1 via ORAL
  Filled 2021-11-07 (×7): qty 1

## 2021-11-07 MED ORDER — SULFAMETHOXAZOLE-TRIMETHOPRIM 800-160 MG PO TABS: 1.0000 | ORAL_TABLET | ORAL | Status: DC

## 2021-11-07 MED ORDER — LACTATED RINGERS IV BOLUS
500.0000 mL | Freq: Once | INTRAVENOUS | Status: AC
  Administered 2021-11-07: 500 mL via INTRAVENOUS

## 2021-11-07 NOTE — TOC Progression Note (Signed)
Transition of Care [facility]) - Progression Note    Patient Details  Name: Jason Herman MRN: [id number] Date of Birth: 30-Jun-1985  Transition of [facility]) CM/SW Contact  Dhara Schepp, Meriam Sprague, RN Phone Number: 11/07/2021, 11:15 AM  Clinical Narrative:    Pt following along for dc planning. Pt is close to being medically stable for dc. Will need new Psych assessment to see if recommendation is still for inpatient psych placement.   Expected Discharge Plan: Psychiatric Hospital Barriers to Discharge: Continued Medical Work up  Expected Discharge Plan and Services Expected Discharge Plan: Psychiatric Hospital   Discharge Planning Services: CM Consult     Readmission Risk Interventions    11/07/2021   11:14 AM  Readmission Risk Prevention Plan  Transportation Screening Complete  PCP or Specialist Appt within 3-5 Days Complete  HRI or Home Care Consult Complete  Social Work Consult for Recovery Care Planning/Counseling Complete  Palliative Care Screening Not Applicable  Medication Review [facility]) Complete

## 2021-11-07 NOTE — Progress Notes (Addendum)
[place] for Infectious Disease  Date of Admission:  11/02/2021      Abx: 5/21-c cefdinir 5/19-c oral bactrim pjp dosing  5/19-5/21 amp-sulb 5/18 vanc cefepime  ASSESSMENT: Hiv/aids Pna -- parainfluenza/cap Ongoing sepsis concerning for pjp process Schizophrenia  Ldh improving without pjp tx. Recent fungitel elevated. Improved on CAP coverage but readmitted found down/confused on the street and fever still at this readmission. Pjp infection suspicion higher  Ams resolved. Has been on room air Crypto ag negative  Lab Results  Component Value Date   CD4TCELL 8 (L) 10/23/2021   CD4TABS 36 (L) 10/23/2021   Lab Results  Component Value Date   HIV1RNAQUANT 119,000 10/24/2021   5/18 bcx ngtd  ---------- 5/23 assessment Psychiatry involved planning inpatient treatment Pjp smear positive Tolerating bactrim PO Not hypoxic  No other sign of OI currently   PLAN: Finish cefdinir by tomorrow Finish 3 week fluconazole course as previously outlined for oropharyngeal candida infection Finish the 3 weeks course bactrim for PJP pna, then transition to bactrim ds 1 tablet tiw until cd4 >200  Will restart ART as he will be inpatient psych and hopefully will start and continue to be compliant with ART  Please call us (from psychiatry unit) to follow up with him in about 3 weeks if he is still there Otherwise I have made an appointment with ID clinic on 6/13 @ 1030 with Dr Juleen China Id will sign off  Discussed with primary team    I spent more than 35 minute reviewing data/chart, and coordinating care and >50% direct face to face time providing counseling/discussing diagnostics/treatment plan with patient     Principal Problem:   Acute metabolic encephalopathy Active Problems:   HIV (human immunodeficiency virus infection) ([place])   Oral candidiasis   Sepsis with acute organ dysfunction ([place])   Acute respiratory failure with hypoxia ([place])   Hypotension    Aspiration pneumonia ([place])   Metabolic acidosis   No Known Allergies  Scheduled Meds:  benztropine  0.5 mg Oral Q12H   bictegravir-emtricitabine-tenofovir AF  1 tablet Oral Daily   cefdinir  300 mg Oral Q12H   enoxaparin (LOVENOX) injection  40 mg Subcutaneous Q24H   ferrous sulfate  325 mg Oral Q breakfast   fluconazole  200 mg Oral Daily   folic acid  1 mg Oral Daily   mouth rinse  15 mL Mouth Rinse BID   risperiDONE  2 mg Oral BID   [START ON 11/27/2021] sulfamethoxazole-trimethoprim  1 tablet Oral Once per day on Mon Wed Fri   sulfamethoxazole-trimethoprim  2 tablet Oral Q8H   traZODone  50 mg Oral QHS   Continuous Infusions:  sodium chloride 100 mL/hr at 11/07/21 1455   PRN Meds:.acetaminophen, acetaminophen, guaiFENesin, haloperidol lactate, naphazoline-glycerin   SUBJECTIVE: No complaint  Plan for transition care to inpatient psych Afebrile Pjp smear positive; pcr in process  Review of Systems: ROS All other ROS was negative, except mentioned above     OBJECTIVE: Vitals:   11/06/21 1352 11/06/21 2245 11/07/21 0652 11/07/21 1346  BP: 102/63 116/74 94/66 117/85  Pulse: (!) 109 97 100 (!) 109  Resp: _0 Temp: 98 F (36.7 C) 99.2 F (37.3 C) 98.5 F (36.9 C) 98.2 F (36.8 C)  TempSrc:  Oral Oral Oral  SpO2: 99% 100% 100% 100%  Weight:      Height:       Body mass index is 16.3 kg/m.  Physical Exam General/constitutional: no distress, pleasant HEENT: Normocephalic, PER, Conj Clear, EOMI, Oropharynx clear Neck supple CV: rrr no mrg Lungs: clear to auscultation, normal respiratory effort Abd: Soft, Nontender Ext: no edema Skin: No Rash Neuro: nonfocal         Lab Results Lab Results  Component Value Date   WBC 3.7 (L) 11/07/2021   HGB 8.6 (L) 11/07/2021   HCT 26.5 (L) 11/07/2021   MCV 99.3 11/07/2021   PLT 240 11/07/2021    Lab Results  Component Value Date   CREATININE 0.89 11/06/2021   BUN 8 11/06/2021   NA 136  11/06/2021   K 4.5 11/06/2021   CL 107 11/06/2021   CO2 21 (L) 11/06/2021    Lab Results  Component Value Date   ALT 33 11/02/2021   AST 42 (H) 11/02/2021   ALKPHOS 53 11/02/2021   BILITOT 0.7 11/02/2021      Microbiology: Recent Results (from the past 240 hour(s))  Blood Culture (routine x 2)     Status: None   Collection Time: 11/02/21  4:55 PM   Specimen: BLOOD  Result Value Ref Range Status   Specimen Description   Final    BLOOD BLOOD LEFT FOREARM Performed at [facility], [place] [address], [place], [place] [address]    Special Requests   Final    BOTTLES DRAWN AEROBIC ONLY Blood Culture results may not be optimal due to an inadequate volume of blood received in culture bottles Performed at [place] [address], [place], [place] [address]    Culture   Final    NO GROWTH 5 DAYS Performed at [facility], [place] [address] [place], [place] [address]    Report Status 11/07/2021 FINAL  Final  Blood Culture (routine x 2)     Status: None   Collection Time: 11/02/21  5:05 PM   Specimen: BLOOD  Result Value Ref Range Status   Specimen Description   Final    BLOOD RIGHT ANTECUBITAL Performed at [place] [address] [place], [place] [address]    Special Requests   Final    BOTTLES DRAWN AEROBIC AND ANAEROBIC Blood Culture results may not be optimal due to an excessive volume of blood received in culture bottles Performed at [place] [address], [place], [place] [address]    Culture   Final    NO GROWTH 5 DAYS Performed at [facility], [place] [address] [place], [place] [address]    Report Status 11/07/2021 FINAL  Final  Resp Panel by RT-PCR (Flu A&B, Covid) Nasopharyngeal Swab     Status: None   Collection Time: 11/02/21  5:25 PM   Specimen: Nasopharyngeal Swab; Nasopharyngeal(NP) swabs in vial transport medium  Result Value Ref Range Status   SARS  Coronavirus 2 by RT PCR NEGATIVE NEGATIVE Final    Comment: (NOTE) SARS-CoV-2 target nucleic acids are NOT DETECTED.  The SARS-CoV-2 RNA is generally detectable in upper respiratory specimens during the acute phase of infection. The lowest concentration of SARS-CoV-2 viral copies this assay can detect is 138 copies/mL. A negative result does not preclude SARS-Cov-2 infection and should not be used as the sole basis for treatment or other patient management decisions. A negative result may occur with  improper specimen collection/handling, submission of specimen other than nasopharyngeal swab, presence of viral mutation(s) within the areas targeted by this assay, and inadequate number of viral copies(<138 copies/mL). A  negative result must be combined with clinical observations, patient history, and epidemiological information. The expected result is Negative.  Fact Sheet for Patients:  EntrepreneurPulse.com.au  Fact Sheet for Healthcare Providers:  IncredibleEmployment.be  This test is no t yet approved or cleared by the [place] FDA and  has been authorized for detection and/or diagnosis of SARS-CoV-2 by FDA under an Emergency Use Authorization (EUA). This EUA will remain  in effect (meaning this test can be used) for the duration of the COVID-19 declaration under Section 564(b)(1) of the Act, 21 U.S.C.section 360bbb-3(b)(1), unless the authorization is terminated  or revoked sooner.       Influenza A by PCR NEGATIVE NEGATIVE Final   Influenza B by PCR NEGATIVE NEGATIVE Final    Comment: (NOTE) The Xpert Xpress SARS-CoV-2/FLU/RSV plus assay is intended as an aid in the diagnosis of influenza from Nasopharyngeal swab specimens and should not be used as a sole basis for treatment. Nasal washings and aspirates are unacceptable for Xpert Xpress SARS-CoV-2/FLU/RSV testing.  Fact Sheet for  Patients: EntrepreneurPulse.com.au  Fact Sheet for Healthcare Providers: IncredibleEmployment.be  This test is not yet approved or cleared by the [place] FDA and has been authorized for detection and/or diagnosis of SARS-CoV-2 by FDA under an Emergency Use Authorization (EUA). This EUA will remain in effect (meaning this test can be used) for the duration of the COVID-19 declaration under Section 564(b)(1) of the Act, 21 U.S.C. section 360bbb-3(b)(1), unless the authorization is terminated or revoked.  Performed at [facility], [place] [address] [place], [place] [address]   Urine Culture     Status: None   Collection Time: 11/02/21  6:26 PM   Specimen: In/Out Cath Urine  Result Value Ref Range Status   Specimen Description   Final    IN/OUT CATH URINE Performed at [place] [address] [place], [place] [address]    Special Requests   Final    NONE Performed at [facility], [place] [address], [place], [place] [address]    Culture   Final    NO GROWTH Performed at [facility], [place] [address], [place], [place] [address]    Report Status 11/04/2021 FINAL  Final  MRSA Next Gen by PCR, Nasal     Status: None   Collection Time: 11/03/21 12:50 AM   Specimen: Nasal Mucosa; Nasal Swab  Result Value Ref Range Status   MRSA by PCR Next Gen NOT DETECTED NOT DETECTED Final    Comment: (NOTE) The GeneXpert MRSA Assay (FDA approved for NASAL specimens only), is one component of a comprehensive MRSA colonization surveillance program. It is not intended to diagnose MRSA infection nor to guide or monitor treatment for MRSA infections. Test performance is not FDA approved in patients less than 20 years old. Performed at [facility], [place] [address], [place], [place] [address]   Culture, Respiratory w Gram Stain     Status: None   Collection Time: 11/04/21  8:20  AM   Specimen: Bronchoalveolar Lavage; Respiratory  Result Value Ref Range Status   Specimen Description   Final    BRONCHIAL ALVEOLAR LAVAGE Performed at [place] [address] [place], [place] [address]    Special Requests   Final    Immunocompromised Performed at [facility], [place] [address], [place], [place] [address]    Gram Stain   Final    WBC PRESENT, PREDOMINANTLY MONONUCLEAR SQUAMOUS EPITHELIAL  CELLS PRESENT NO ORGANISMS SEEN CYTOSPIN SMEAR    Culture   Final    NO GROWTH 2 DAYS Performed at [facility], [place] [address], [place], [place] [address]    Report Status 11/06/2021 FINAL  Final  Pneumocystis smear by DFA     Status: None   Collection Time: 11/04/21  8:20 AM   Specimen: Bronchial Alveolar Lavage; Respiratory  Result Value Ref Range Status   Specimen [facility] BRONCHIAL ALVEOLAR LAVAGE  Corrected    Comment: CORRECTED ON 05/20 AT 0948: PREVIOUSLY REPORTED AS SPUTUM RESPIRATORY CULTURE   Pneumocystis jiroveci Ag POSITIVE  Final    Comment: Performed at [place], READ BACK BY AND VERIFIED WITH: DEVIN, RN AT 2156 ON 11/04/2021 BY Crist Fat, MT Performed at [facility], [place] [address], [place], [place] [address]      Serology:   Imaging: If present, new imagings (plain films, ct scans, and mri) have been personally visualized and interpreted; radiology reports have been reviewed. Decision making incorporated into the Impression / Recommendations.  5/18 cxr Mild diffuse interstitial and ill-defined opacities, potentially related to findings seen on recent CT chest. No confluent consolidation.    5/18 head ct No acute intracranial abnormality.   Diffuse parenchymal atrophy, advanced in relation of the patient's stated age.   Mild diffuse paranasal sinus disease   Jabier Mutton, [place] for Infectious Disease [place] [phone] pager    11/07/2021, 3:58 PM

## 2021-11-07 NOTE — Plan of Care (Signed)
°  Problem: Coping: °Goal: Level of anxiety will decrease °Outcome: Progressing °  °

## 2021-11-07 NOTE — Progress Notes (Signed)
PROGRESS NOTE    CONNY MOENING  QIH:[id number] DOB: 01-26-1986 DOA: 11/02/2021 PCP: Patient, No Pcp Per (Inactive)   Brief Narrative: 36 year old with past medical history significant for HIV/AIDS with nonadherence with medication, paranoia, oral candidiasis who presented with altered mental status.  Patient was found to be obtunded and hypoxic on admission.  Subsequently he got agitated in the ED and received Geodon.  He was found to have 4 yellow pills  with him which were later identified as clonidine.  He was found to be febrile, hypotensive and hypoxic.  He was recently hospitalized from 5/8 until 5/11 for community-acquired pneumonia. Chest x-ray; mild diffuse interstitial and ill-defined opacities, potentially related to findings seen on recent CT chest.  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  Patient is started on treatment for PCP pneumonia.  He was also evaluated by psychiatry due to paranoid behavior and psychosis.  He was a started on risperidone and trazodone.  Psych was recommending on last evaluation inpatient psychiatric facility admission.     Assessment & Plan:   Principal Problem:   Acute metabolic encephalopathy Active Problems:   Acute respiratory failure with hypoxia (HCC)   HIV (human immunodeficiency virus infection) ([place])   Oral candidiasis   Sepsis with acute organ dysfunction (HCC)   Hypotension   Aspiration pneumonia (HCC)   Metabolic acidosis  1-Acute Metabolic Encephalopathy: -Encephalopathy likely multifactorial in the setting of clonidine overdose, sepsis from pneumonia, untreated underlying HIV. Complicated by underline psychiatric disorder.  -CT head: No acute intracranial abnormality.  Diffuse parenchymal atrophy, advanced in relation of patient's stated age. -Evaluated by ID, no need for LP at this time.  -He received Narcan on admission  without significant improvement on MS.  -Received Geodon  and Ativan subsequently for agitation on admission./  -Patient with paranoid thought. Psych consulted. Recommend Risperdal and trazodone.  Improving.   2-Acute Hypoxic Respiratory failure on admission, PNA PCP  -He presented with oxygen saturation down to 70%.  He was placed on oxygen. -Currently he is off of oxygen oxygen sat more than 90% -Received  IV ampicillin change to Cefdinir 5/21 for 3 more days, to complete community PNA.  -Continue with  treatment for PCP pneumonia with Bactrim. -Underwent Bronchoscopy 5/20, follow culture: no growth to date. .  Pneumocystis  smear antigen positive, fungus, acid fast culture, pneumocystis PCR pending.   3-Paranoid, Confusion:  Per Psych Differential : Unspecified Psychotic Disorder Vs Schizophrenia Vs Delirium Vs Brief psychotic disorder.  Appreciate Psych evaluation.  -Started on Risperdal 1 mg BID, increase to 2 mg BID on 5/22. -Started on Congentin  -Trazodone HS.  -Needs inpatient psych facility admission when medical clear.  -Cant Leave AMA.  Psych consulted for follow up/ 5/23.   Hypotension: Suspect related to clonidine overdose -He was found to have clonidine pills on him with unknown amount of ingestion. -Hypotensive initially received 4 L of IV fluids. -Blood pressure improved.  -Oral candidiasis: Continue with fluconazole.  -HIV/AIDS CD4 count 5/80/2023 was 36. No adherent to medication He was a started on Biktarvy on discharge last admission.  Started on Biktarvy 5/23,  now that he will be discharge to inpatient psych facility.   Anemia; Anemia panel. : Iron deficiency anemia. Started  iron supplement.  Folic acid deficiency. Started folic acid.   Tachycardia; sinus tachycardia. Will give IV bolus. Hb improving. Check TSH.  Metabolic acidosis, lactic acidosis.  Lactic acidosis could be related to infection, and hypoperfusion , Hypoxemia.  Resolved with  IV fluids.   Mild hyponatremia; Treated   with IV fluids.  Resolved.   Estimated body mass index is 16.3 kg/m as calculated from the following:   Height as of this encounter: 6' (1.829 m).   Weight as of this encounter: 54.5 kg.   DVT prophylaxis: Lovenox Code Status: Full code Family Communication: care discussed with patient.  Disposition Plan:  Status is: Inpatient Remains inpatient appropriate because: management of infection, PNA    Consultants:  CCM ID  Procedures:    Antimicrobials:    Subjective: He denies chest pain, or dyspnea.   Objective: Vitals:   11/06/21 1046 11/06/21 1352 11/06/21 2245 11/07/21 0652  BP: (!) 97/59 102/63 116/74 94/66  Pulse: 100 (!) 109 97 100  Resp: _0 Temp:  98 F (36.7 C) 99.2 F (37.3 C) 98.5 F (36.9 C)  TempSrc: Oral  Oral Oral  SpO2: 99% 99% 100% 100%  Weight:      Height:        Intake/Output Summary (Last 24 hours) at 11/07/2021 1333 Last data filed at 11/07/2021 1032 Gross per 24 hour  Intake 2272.11 ml  Output 2725 ml  Net -452.89 ml    Filed Weights   11/02/21 1719 11/03/21 0139  Weight: 54 kg 54.5 kg    Examination:  General exam: NAD Respiratory system: CTA Cardiovascular system: S 1, S 2 RRR Gastrointestinal system: BS present, soft, nt Central nervous system: Alert, follows command  Data Reviewed: I have personally reviewed following labs and imaging studies  CBC: Recent Labs  Lab 11/02/21 1646 11/03/21 0121 11/04/21 0244 11/05/21 0516 11/06/21 0612 11/07/21 1112  WBC 6.1 5.4 3.0* 3.8* 3.0* 3.7*  NEUTROABS 4.5  --   --   --   --   --   HGB 9.4* 10.5* 7.2* 7.3* 7.9* 8.6*  HCT 29.3* 32.4* 22.9* 22.8* 25.3* 26.5*  MCV 98.7 98.2 98.7 99.1 100.4* 99.3  PLT 231 124* 173 184 208 628    Basic Metabolic Panel: Recent Labs  Lab 11/02/21 1646 11/03/21 0121 11/04/21 0244 11/05/21 0516 11/06/21 0612  NA 135 134* 138 139 136  K 4.3 4.2 3.5 4.2 4.5  CL 104 110 110 109 107  CO2 21* 19* 23 23 21*  GLUCOSE 106* 103* 93 189* 80  BUN _1 CREATININE 0.90 0.71 0.81 0.76 0.89  CALCIUM 8.7* 8.0* 8.0* 8.6* 8.6*    GFR: Estimated Creatinine Clearance: 88.5 mL/min (by C-G formula based on SCr of 0.89 mg/dL). Liver Function Tests: Recent Labs  Lab 11/02/21 1646  AST 42*  ALT 33  ALKPHOS 53  BILITOT 0.7  PROT 7.6  ALBUMIN 2.6*    No results for input(s): LIPASE, AMYLASE in the last 168 hours. Recent Labs  Lab 11/02/21 1646  AMMONIA 24    Coagulation Profile: Recent Labs  Lab 11/02/21 1646  INR 1.2    Cardiac Enzymes: Recent Labs  Lab 11/02/21 1646  CKTOTAL 72    BNP (last 3 results) No results for input(s): PROBNP in the last 8760 hours. HbA1C: No results for input(s): HGBA1C in the last 72 hours. CBG: Recent Labs  Lab 11/03/21 0006 11/04/21 1938  GLUCAP 93 243*    Lipid Profile: No results for input(s): CHOL, HDL, LDLCALC, TRIG, CHOLHDL, LDLDIRECT in the last 72 hours. Thyroid Function Tests: Recent Labs    11/07/21 1112  TSH 1.173   Anemia Panel: No results for input(s): VITAMINB12, FOLATE, FERRITIN, TIBC,  IRON, RETICCTPCT in the last 72 hours.  Sepsis Labs: Recent Labs  Lab 11/02/21 1646 11/02/21 1846 11/02/21 2235 11/03/21 0121  LATICACIDVEN 3.1* 3.0* 1.4 2.4*     Recent Results (from the past 240 hour(s))  Blood Culture (routine x 2)     Status: None   Collection Time: 11/02/21  4:55 PM   Specimen: BLOOD  Result Value Ref Range Status   Specimen Description   Final    BLOOD BLOOD LEFT FOREARM Performed at [facility], [place] [address], [place], [place] [address]    Special Requests   Final    BOTTLES DRAWN AEROBIC ONLY Blood Culture results may not be optimal due to an inadequate volume of blood received in culture bottles Performed at [place] [address], [place], [place] [address]    Culture   Final    NO GROWTH 5 DAYS Performed at [facility], [place] [address], [place], [place] [address]    Report  Status 11/07/2021 FINAL  Final  Blood Culture (routine x 2)     Status: None   Collection Time: 11/02/21  5:05 PM   Specimen: BLOOD  Result Value Ref Range Status   Specimen Description   Final    BLOOD RIGHT ANTECUBITAL Performed at [place] [address] [place], [place] [address]    Special Requests   Final    BOTTLES DRAWN AEROBIC AND ANAEROBIC Blood Culture results may not be optimal due to an excessive volume of blood received in culture bottles Performed at [place] [address], [place], [place] [address]    Culture   Final    NO GROWTH 5 DAYS Performed at [facility], [place] [address] [place], [place] [address]    Report Status 11/07/2021 FINAL  Final  Resp Panel by RT-PCR (Flu A&B, Covid) Nasopharyngeal Swab     Status: None   Collection Time: 11/02/21  5:25 PM   Specimen: Nasopharyngeal Swab; Nasopharyngeal(NP) swabs in vial transport medium  Result Value Ref Range Status   SARS Coronavirus 2 by RT PCR NEGATIVE NEGATIVE Final    Comment: (NOTE) SARS-CoV-2 target nucleic acids are NOT DETECTED.  The SARS-CoV-2 RNA is generally detectable in upper respiratory specimens during the acute phase of infection. The lowest concentration of SARS-CoV-2 viral copies this assay can detect is 138 copies/mL. A negative result does not preclude SARS-Cov-2 infection and should not be used as the sole basis for treatment or other patient management decisions. A negative result may occur with  improper specimen collection/handling, submission of specimen other than nasopharyngeal swab, presence of viral mutation(s) within the areas targeted by this assay, and inadequate number of viral copies(<138 copies/mL). A negative result must be combined with clinical observations, patient history, and epidemiological information. The expected result is Negative.  Fact Sheet for Patients:  EntrepreneurPulse.com.au  Fact  Sheet for Healthcare Providers:  IncredibleEmployment.be  This test is no t yet approved or cleared by the [place] FDA and  has been authorized for detection and/or diagnosis of SARS-CoV-2 by FDA under an Emergency Use Authorization (EUA). This EUA will remain  in effect (meaning this test can be used) for the duration of the COVID-19 declaration under Section 564(b)(1) of the Act, 21 U.S.C.section 360bbb-3(b)(1), unless the authorization is terminated  or revoked sooner.       Influenza A by PCR NEGATIVE NEGATIVE Final   Influenza B by PCR NEGATIVE NEGATIVE  Final    Comment: (NOTE) The Xpert Xpress SARS-CoV-2/FLU/RSV plus assay is intended as an aid in the diagnosis of influenza from Nasopharyngeal swab specimens and should not be used as a sole basis for treatment. Nasal washings and aspirates are unacceptable for Xpert Xpress SARS-CoV-2/FLU/RSV testing.  Fact Sheet for Patients: EntrepreneurPulse.com.au  Fact Sheet for Healthcare Providers: IncredibleEmployment.be  This test is not yet approved or cleared by the [place] FDA and has been authorized for detection and/or diagnosis of SARS-CoV-2 by FDA under an Emergency Use Authorization (EUA). This EUA will remain in effect (meaning this test can be used) for the duration of the COVID-19 declaration under Section 564(b)(1) of the Act, 21 U.S.C. section 360bbb-3(b)(1), unless the authorization is terminated or revoked.  Performed at [facility], [place] [address], [place], [place] [address]   Urine Culture     Status: None   Collection Time: 11/02/21  6:26 PM   Specimen: In/Out Cath Urine  Result Value Ref Range Status   Specimen Description   Final    IN/OUT CATH URINE Performed at [place] [address], [place], [place] [address]    Special Requests   Final    NONE Performed at [facility],  [place] [address] [place], [place] [address]    Culture   Final    NO GROWTH Performed at [facility], [place] [address] [place], [place] [address]    Report Status 11/04/2021 FINAL  Final  MRSA Next Gen by PCR, Nasal     Status: None   Collection Time: 11/03/21 12:50 AM   Specimen: Nasal Mucosa; Nasal Swab  Result Value Ref Range Status   MRSA by PCR Next Gen NOT DETECTED NOT DETECTED Final    Comment: (NOTE) The GeneXpert MRSA Assay (FDA approved for NASAL specimens only), is one component of a comprehensive MRSA colonization surveillance program. It is not intended to diagnose MRSA infection nor to guide or monitor treatment for MRSA infections. Test performance is not FDA approved in patients less than 23 years old. Performed at [facility], [place] [address] [place], [place] [address]   Culture, Respiratory w Gram Stain     Status: None   Collection Time: 11/04/21  8:20 AM   Specimen: Bronchoalveolar Lavage; Respiratory  Result Value Ref Range Status   Specimen Description   Final    BRONCHIAL ALVEOLAR LAVAGE Performed at [place] [address] [place], [place] [address]    Special Requests   Final    Immunocompromised Performed at [facility], [place] [address] [place], [place] [address]    Gram Stain   Final    WBC PRESENT, PREDOMINANTLY MONONUCLEAR SQUAMOUS EPITHELIAL CELLS PRESENT NO ORGANISMS SEEN CYTOSPIN SMEAR    Culture   Final    NO GROWTH 2 DAYS Performed at [facility], [place] [address], [place], [place] [address]    Report Status 11/06/2021 FINAL  Final  Pneumocystis smear by DFA     Status: None   Collection Time: 11/04/21  8:20 AM   Specimen: Bronchial Alveolar Lavage; Respiratory  Result Value Ref Range Status   Specimen [facility] BRONCHIAL ALVEOLAR LAVAGE  Corrected    Comment: CORRECTED ON 05/20 AT 0948: PREVIOUSLY REPORTED AS SPUTUM RESPIRATORY CULTURE   Pneumocystis  jiroveci Ag POSITIVE  Final    Comment: Performed at [place], READ BACK BY AND  VERIFIED WITH: DEVIN, RN AT 2156 ON 11/04/2021 BY Crist Fat, MT Performed at [place] [address], [place], [place] [address]           Radiology Studies: No results found.      Scheduled Meds:  benztropine  0.5 mg Oral Q12H   bictegravir-emtricitabine-tenofovir AF  1 tablet Oral Daily   cefdinir  300 mg Oral Q12H   enoxaparin (LOVENOX) injection  40 mg Subcutaneous Q24H   ferrous sulfate  325 mg Oral Q breakfast   fluconazole  200 mg Oral Daily   folic acid  1 mg Oral Daily   mouth rinse  15 mL Mouth Rinse BID   risperiDONE  2 mg Oral BID   [START ON 11/27/2021] sulfamethoxazole-trimethoprim  1 tablet Oral Once per day on Mon Wed Fri   sulfamethoxazole-trimethoprim  2 tablet Oral Q8H   traZODone  50 mg Oral QHS   Continuous Infusions:  sodium chloride 75 mL/hr at 11/07/21 1118     LOS: 5 days    Time spent: 35 minutes    Correne Lalani A [place] Segler, MD [facility]   If 7PM-7AM, please contact night-coverage [email or web address]  11/07/2021, 1:33 PM

## 2021-11-07 NOTE — Progress Notes (Signed)
24 hour chart audit completed 
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
## 2021-11-08 ENCOUNTER — Encounter (HOSPITAL_COMMUNITY): Payer: Self-pay | Admitting: Family Medicine

## 2021-11-08 DIAGNOSIS — G9341 Metabolic encephalopathy: Secondary | ICD-10-CM | POA: Diagnosis not present

## 2021-11-08 DIAGNOSIS — B59 Pneumocystosis: Secondary | ICD-10-CM

## 2021-11-08 LAB — CBC
HCT: 25.3 % — ABNORMAL LOW (ref 39.0–52.0)
Hemoglobin: 8.1 g/dL — ABNORMAL LOW (ref 13.0–17.0)
MCH: 31.9 pg (ref 26.0–34.0)
MCHC: 32 g/dL (ref 30.0–36.0)
MCV: 99.6 fL (ref 80.0–100.0)
Platelets: 224 10*3/uL (ref 150–400)
RBC: 2.54 MIL/uL — ABNORMAL LOW (ref 4.22–5.81)
RDW: 16.5 % — ABNORMAL HIGH (ref 11.5–15.5)
WBC: 2.7 10*3/uL — ABNORMAL LOW (ref 4.0–10.5)
nRBC: 0 % (ref 0.0–0.2)

## 2021-11-08 LAB — PHOSPHORUS: Phosphorus: 3.8 mg/dL (ref 2.5–4.6)

## 2021-11-08 LAB — BASIC METABOLIC PANEL
Anion gap: 6 (ref 5–15)
BUN: 10 mg/dL (ref 6–20)
CO2: 21 mmol/L — ABNORMAL LOW (ref 22–32)
Calcium: 8.7 mg/dL — ABNORMAL LOW (ref 8.9–10.3)
Chloride: 108 mmol/L (ref 98–111)
Creatinine, Ser: 0.73 mg/dL (ref 0.61–1.24)
GFR, Estimated: >60 mL/min (ref >60)
Glucose, Bld: 88 mg/dL (ref 70–99)
Potassium: 4.3 mmol/L (ref 3.5–5.1)
Sodium: 135 mmol/L (ref 135–145)

## 2021-11-08 MED ORDER — LORAZEPAM 2 MG/ML IJ SOLN
2.0000 mg | Freq: Once | INTRAMUSCULAR | Status: AC
  Administered 2021-11-08: 2 mg via INTRAMUSCULAR
  Filled 2021-11-08: qty 1

## 2021-11-08 MED ORDER — THIAMINE HCL 100 MG PO TABS
100.0000 mg | ORAL_TABLET | Freq: Every day | ORAL | Status: DC
  Administered 2021-11-08 – 2021-11-13 (×6): 100 mg via ORAL
  Filled 2021-11-08 (×6): qty 1

## 2021-11-08 MED ORDER — OLANZAPINE 5 MG PO TABS
2.5000 mg | ORAL_TABLET | Freq: Every day | ORAL | Status: DC
  Administered 2021-11-09: 2.5 mg via ORAL
  Filled 2021-11-08 (×2): qty 1

## 2021-11-08 MED ORDER — ENSURE ENLIVE PO LIQD
237.0000 mL | Freq: Three times a day (TID) | ORAL | Status: DC
  Administered 2021-11-08 – 2021-11-13 (×14): 237 mL via ORAL

## 2021-11-08 MED ORDER — LORAZEPAM 2 MG/ML IJ SOLN
1.0000 mg | Freq: Four times a day (QID) | INTRAMUSCULAR | Status: DC
  Administered 2021-11-08 – 2021-11-09 (×3): 1 mg via INTRAMUSCULAR
  Filled 2021-11-08 (×3): qty 1

## 2021-11-08 MED ORDER — HALOPERIDOL LACTATE 5 MG/ML IJ SOLN: 2.0000 mg | Freq: Four times a day (QID) | INTRAMUSCULAR | Status: DC | PRN

## 2021-11-08 MED ORDER — HALOPERIDOL 0.5 MG PO TABS: 2.0000 mg | ORAL_TABLET | Freq: Four times a day (QID) | ORAL | Status: DC | PRN

## 2021-11-08 MED ORDER — HALOPERIDOL LACTATE 5 MG/ML IJ SOLN
1.0000 mg | Freq: Four times a day (QID) | INTRAMUSCULAR | Status: DC | PRN
  Administered 2021-11-08: 1 mg via INTRAVENOUS
  Filled 2021-11-08: qty 1

## 2021-11-08 MED ORDER — ADULT MULTIVITAMIN W/MINERALS CH
1.0000 | ORAL_TABLET | Freq: Every day | ORAL | Status: DC
  Administered 2021-11-08 – 2021-11-13 (×6): 1 via ORAL
  Filled 2021-11-08 (×6): qty 1

## 2021-11-08 NOTE — Progress Notes (Signed)
Initial Nutrition Assessment  DOCUMENTATION CODES:   Severe malnutrition in context of chronic illness, Underweight  INTERVENTION:   -Ensure Plus High Protein po TID, each supplement provides 350 kcal and 20 grams of protein.   -Multivitamin with minerals daily  NUTRITION DIAGNOSIS:   Severe Malnutrition related to chronic illness (HIV/AIDS, substance abuse) as evidenced by percent weight loss, severe fat depletion, severe muscle depletion.  GOAL:   Patient will meet greater than or equal to 90% of their needs  MONITOR:   PO intake, Supplement acceptance, Labs, Weight trends, I & O's  REASON FOR ASSESSMENT:   Malnutrition Screening Tool    ASSESSMENT:   36 year old with past medical history significant for HIV/AIDS with nonadherence with medication, paranoia, oral candidiasis who presented with altered mental status.  Patient was found to be obtunded and hypoxic on admission.  Patient in room, sitter at bedside.  Pt with slurred speech, unable to understand at times. Got a bit agitated with my questioning and when the sitter would try and clarify something. Pt ate 90% of his breakfast today. Did not understand him when asked about his chewing and swallowing abilities.  Pt agreeable to Ensure supplements, but only vanilla or strawberry. States chocolate makes him feel like a kid. Provided Ensure at end of visit.  Per weight records, pt has lost 59 lbs since 04/04/21 (32% wt loss 7 months, significant for time frame).  Medications: Ferrous sulfate, Folic acid  Labs reviewed:  CBGs: 243  NUTRITION - FOCUSED PHYSICAL EXAM:  Flowsheet Row Most Recent Value  Orbital Region Severe depletion  Upper Arm Region Severe depletion  Thoracic and Lumbar Region Unable to assess  Buccal Region Severe depletion  Temple Region Severe depletion  Clavicle Bone Region Severe depletion  Clavicle and Acromion Bone Region Severe depletion  Scapular Bone Region Severe depletion  Dorsal  Hand Moderate depletion  Patellar Region Severe depletion  Anterior Thigh Region Severe depletion  Posterior Calf Region Severe depletion  Edema (RD Assessment) None  Hair Reviewed  Eyes Reviewed  Mouth Reviewed  [poor dentition]  Skin Reviewed       Diet Order:   Diet Order             Diet regular Room service appropriate? Yes; Fluid consistency: Thin  Diet effective now                   EDUCATION NEEDS:   No education needs have been identified at this time  Skin:  Skin Assessment: Reviewed RN Assessment  Last BM:  5/23  Height:   Ht Readings from Last 1 Encounters:  11/03/21 6' (1.829 m)    Weight:   Wt Readings from Last 1 Encounters:  11/03/21 54.5 kg    BMI:  Body mass index is 16.3 kg/m.  Estimated Nutritional Needs:   Kcal:  2200-2400  Protein:  105-120g  Fluid:  2L/day  Clayton Bibles, MS, RD, LDN Inpatient Clinical Dietitian Contact information available via Amion

## 2021-11-08 NOTE — Progress Notes (Signed)
PROGRESS NOTE    Jason Herman  HCW:[id number] DOB: 1985/12/02 DOA: 11/02/2021 PCP: Patient, No Pcp Per (Inactive)   Brief Narrative: 36 year old with past medical history significant for HIV/AIDS with nonadherence with medication, paranoia, oral candidiasis who presented with altered mental status.  Patient was found to be obtunded and hypoxic on admission.  Subsequently he got agitated in the ED and received Geodon.  He was found to have 4 yellow pills  with him which were later identified as clonidine.  He was found to be febrile, hypotensive and hypoxic.  He was recently hospitalized from 5/8 until 5/11 for community-acquired pneumonia. Chest x-ray; mild diffuse interstitial and ill-defined opacities, potentially related to findings seen on recent CT chest.  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  Patient is started on treatment for PCP pneumonia.  He was also evaluated by psychiatry due to paranoid behavior and psychosis.  He was a started on risperidone and trazodone.  Psych was recommending on last evaluation inpatient psychiatric facility admission.     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 Complicated by underline psychiatric disorder.  -CT head: No acute intracranial abnormality.  Diffuse parenchymal atrophy, advanced in relation of patient's stated age. -Evaluated by ID, no need for LP at this time.  -He received Narcan on  admission  without significant improvement on MS.  -Received Geodon and Ativan subsequently for agitation on admission./  -Patient with paranoid thought. Psych consulted. Recommend Risperdal and trazodone.  Slowly improving but continues to have episodes of agitation.  2-Acute Hypoxic Respiratory failure on admission, PNA PCP  -He presented with oxygen saturation down to 70%.  He was placed on oxygen. -Weaned off of oxygen.  Saturating in the mid to late 90s on room air. -Received  IV ampicillin.  He was changed to cefdinir.  He has completed the course of same.  -Underwent Bronchoscopy 5/20, follow culture: no growth to date. .  Pneumocystis  smear antigen positive, fungus, acid fast culture, pneumocystis PCR pending.  -Continue with  treatment for PCP pneumonia with Bactrim.  ID recommends 3-week course of Bactrim followed by Bactrim DS 3 times a week until CD4 count is greater than 200.  3-Paranoid, Confusion:  Per Psych Differential : Unspecified Psychotic Disorder Vs Schizophrenia Vs Delirium Vs Brief psychotic disorder.  Psychiatry is following closely.  Started on respite all, Cogentin and trazodone.  Continues to require Haldol as needed. -Initially was thought to require inpatient psychiatric care.  Will defer to psychiatry.   -Cant Leave AMA.   Hypotension: Suspect related to clonidine overdose -He was found to have clonidine pills on him with unknown amount of ingestion. -Hypotensive initially received 4 L of IV fluids. -Blood pressure improved.  -Oral candidiasis: Continue with fluconazole.  He is on a 3-week course.  -HIV/AIDS CD4 count 5/80/2023 was 36. No adherent to medication He was a started on Biktarvy on discharge last admission.  Started on [facility] 5/23.   Normocytic Anemia; Anemia panel. : Iron deficiency anemia. Started  iron supplement.  Folic  acid deficiency. Started folic acid.   Sinus tachycardia; likely secondary to agitation.    Metabolic acidosis,  lactic acidosis.  Lactic acidosis could be related to infection, and hypoperfusion , Hypoxemia.  Resolved with IV fluids.   Mild hyponatremia; Treated   with IV fluids. Resolved.   DVT prophylaxis: Lovenox Code Status: Full code Family Communication: No family at bedside Disposition Plan: To be determined  Status is: Inpatient Remains inpatient appropriate because: management of infection, PNA    Consultants:  CCM ID  Procedures:  None    Subjective: Not very easy to communicate with due to his psychiatric illness.  Does not answer questions appropriately at times.  Denies any complaints.  Seems to be comfortable.  Objective: Vitals:   11/06/21 1352 11/06/21 2245 11/07/21 0652 11/07/21 1346  BP: 102/63 116/74 94/66 117/85  Pulse: (!) 109 97 100 (!) 109  Resp: _0 Temp:   98.5 F (36.9 C) 98.2 F (36.8 C)  TempSrc:  Oral Oral Oral  SpO2: 99% 100% 100% 100%  Weight:      Height:        Intake/Output Summary (Last 24 hours) at 11/08/2021 1149 Last data filed at 11/08/2021 1100 Gross per 24 hour  Intake 2997.73 ml  Output 2050 ml  Net 947.73 ml    Filed Weights   11/02/21 1719 11/03/21 0139  Weight: 54 kg 54.5 kg    Examination:  General appearance: Awake alert.  In no distress.  Distracted Resp: Clear to auscultation bilaterally.  Normal effort Cardio: S1-S2 is normal regular.  No S3-S4.  No rubs murmurs or bruit GI: Abdomen is soft.  Nontender nondistended.  Bowel sounds are present normal.  No masses organomegaly Extremities: No edema.  Full range of motion of lower extremities. Neurologic:  No focal neurological deficits.     Data Reviewed: I have personally reviewed following labs and imaging studies  CBC: Recent Labs  Lab 11/02/21 1646 11/03/21 0121 11/04/21 0244 11/05/21 0516 11/06/21 0612 11/07/21 1112 11/08/21 0513  WBC 6.1   < > 3.0* 3.8* 3.0* 3.7* 2.7*  NEUTROABS 4.5  --   --   --   --   --   --   HGB 9.4*   < > 7.2* 7.3*  7.9* 8.6* 8.1*  HCT 29.3*   < > 22.9* 22.8* 25.3* 26.5* 25.3*  MCV 98.7   < > 98.7 99.1 100.4* 99.3 99.6  PLT 231   < > 173 184 208 240 224   < > = values in this interval not displayed.    Basic Metabolic Panel: Recent Labs  Lab 11/03/21 0121 11/04/21 0244 11/05/21 0516 11/06/21 0612 11/08/21 0513  NA 134* 138 139 136 135  K 4.2 3.5 4.2 4.5 4.3  CL 110 110 109 107 108  CO2 19* 23 23 21* 21*  GLUCOSE 103* 93 189* 80 88  BUN _1 CREATININE 0.71 0.81 0.76 0.89 0.73  CALCIUM 8.0* 8.0* 8.6* 8.6* 8.7*    GFR: Estimated Creatinine Clearance: 98.4 mL/min (by C-G formula based on SCr of 0.73 mg/dL).  Liver Function Tests: Recent Labs  Lab 11/02/21 1646  AST 42*  ALT 33  ALKPHOS 53  BILITOT 0.7  PROT 7.6  ALBUMIN 2.6*     Recent Labs  Lab 11/02/21 1646  AMMONIA 24    Coagulation Profile: Recent Labs  Lab 11/02/21 1646  INR 1.2    Cardiac Enzymes: Recent Labs  Lab  11/02/21 1646  CKTOTAL 72     CBG: Recent Labs  Lab 11/03/21 0006 11/04/21 1938  GLUCAP 93 243*    Thyroid Function Tests: Recent Labs    11/07/21 1112  TSH 1.173     Sepsis Labs: Recent Labs  Lab 11/02/21 1646 11/02/21 1846 11/02/21 2235 11/03/21 0121  LATICACIDVEN 3.1* 3.0* 1.4 2.4*     Recent Results (from the past 240 hour(s))  Blood Culture (routine x 2)     Status: None   Collection Time: 11/02/21  4:55 PM   Specimen: BLOOD  Result Value Ref Range Status   Specimen Description   Final    BLOOD BLOOD LEFT FOREARM Performed at [facility], [place] [address], [place], [place] [address]    Special Requests   Final    BOTTLES DRAWN AEROBIC ONLY Blood Culture results may not be optimal due to an inadequate volume of blood received in culture bottles Performed at [place] [address], [place], [place] [address]    Culture   Final    NO GROWTH 5 DAYS Performed at [facility], [place] [address]  [place], [place] [address]    Report Status 11/07/2021 FINAL  Final  Blood Culture (routine x 2)     Status: None   Collection Time: 11/02/21  5:05 PM   Specimen: BLOOD  Result Value Ref Range Status   Specimen Description   Final    BLOOD RIGHT ANTECUBITAL Performed at [place] [address] [place], [place] [address]    Special Requests   Final    BOTTLES DRAWN AEROBIC AND ANAEROBIC Blood Culture results may not be optimal due to an excessive volume of blood received in culture bottles Performed at [place] [address] [place], [place] [address]    Culture   Final    NO GROWTH 5 DAYS Performed at [facility], [place] [address], [place],  [address]    Report Status 11/07/2021 FINAL  Final  Resp Panel by RT-PCR (Flu A&B, Covid) Nasopharyngeal Swab     Status: None   Collection Time: 11/02/21  5:25 PM   Specimen: Nasopharyngeal Swab; Nasopharyngeal(NP) swabs in vial transport medium  Result Value Ref Range Status   SARS Coronavirus 2 by RT PCR NEGATIVE NEGATIVE Final    Comment: (NOTE) SARS-CoV-2 target nucleic acids are NOT DETECTED.  The SARS-CoV-2 RNA is generally detectable in upper respiratory specimens during the acute phase of infection. The lowest concentration of SARS-CoV-2 viral copies this assay can detect is 138 copies/mL. A negative result does not preclude SARS-Cov-2 infection and should not be used as the sole basis for treatment or other patient management decisions. A negative result may occur with  improper specimen collection/handling, submission of specimen other than nasopharyngeal swab, presence of viral mutation(s) within the areas targeted by this assay, and inadequate number of viral copies(<138 copies/mL). A negative result must be combined with clinical observations, patient history, and epidemiological information. The expected result is Negative.  Fact Sheet for Patients:   EntrepreneurPulse.com.au  Fact Sheet for Healthcare Providers:  IncredibleEmployment.be  This test is no t yet approved or cleared by the [place] FDA and  has been authorized for detection and/or diagnosis of SARS-CoV-2 by FDA under an Emergency Use Authorization (EUA). This EUA will remain  in effect (meaning this test can be used) for the duration of the COVID-19 declaration under Section 564(b)(1) of the  Act, 21 U.S.C.section 360bbb-3(b)(1), unless the authorization is terminated  or revoked sooner.       Influenza A by PCR NEGATIVE NEGATIVE Final   Influenza B by PCR NEGATIVE NEGATIVE Final    Comment: (NOTE) The Xpert Xpress SARS-CoV-2/FLU/RSV plus assay is intended as an aid in the diagnosis of influenza from Nasopharyngeal swab specimens and should not be used as a sole basis for treatment. Nasal washings and aspirates are unacceptable for Xpert Xpress SARS-CoV-2/FLU/RSV testing.  Fact Sheet for Patients: EntrepreneurPulse.com.au  Fact Sheet for Healthcare Providers: IncredibleEmployment.be  This test is not yet approved or cleared by the [place] FDA and has been authorized for detection and/or diagnosis of SARS-CoV-2 by FDA under an Emergency Use Authorization (EUA). This EUA will remain in effect (meaning this test can be used) for the duration of the COVID-19 declaration under Section 564(b)(1) of the Act, 21 U.S.C. section 360bbb-3(b)(1), unless the authorization is terminated or revoked.  Performed at [facility], [place] [address], [place], [place] [address]   Urine Culture     Status: None   Collection Time: 11/02/21  6:26 PM   Specimen: In/Out Cath Urine  Result Value Ref Range Status   Specimen Description   Final    IN/OUT CATH URINE Performed at [place] [address], [place], [place] [address]    Special Requests   Final     NONE Performed at [facility], [place] [address] [place] [address], [place], [place] [address]    Culture   Final    NO GROWTH Performed at [facility], [place] [address] [place], [place] [address]    Report Status 11/04/2021 FINAL  Final  MRSA Next Gen by PCR, Nasal     Status: None   Collection Time: 11/03/21 12:50 AM   Specimen: Nasal Mucosa; Nasal Swab  Result Value Ref Range Status   MRSA by PCR Next Gen NOT DETECTED NOT DETECTED Final    Comment: (NOTE) The GeneXpert MRSA Assay (FDA approved for NASAL specimens only), is one component of a comprehensive MRSA colonization surveillance program. It is not intended to diagnose MRSA infection nor to guide or monitor treatment for MRSA infections. Test performance is not FDA approved in patients less than 73 years old. Performed at [facility], [place] [address] [place], [place] [address]   Culture, Respiratory w Gram Stain     Status: None   Collection Time: 11/04/21  8:20 AM   Specimen: Bronchoalveolar Lavage; Respiratory  Result Value Ref Range Status   Specimen Description   Final    BRONCHIAL ALVEOLAR LAVAGE Performed at [place] [address], [place], [place] [address]    Special Requests   Final    Immunocompromised Performed at [facility], [place] [address], [place], [place] [address]    Gram Stain   Final    WBC PRESENT, PREDOMINANTLY MONONUCLEAR SQUAMOUS EPITHELIAL CELLS PRESENT NO ORGANISMS SEEN CYTOSPIN SMEAR    Culture   Final    NO GROWTH 2 DAYS Performed at [facility], [place] [address] [place], [place] [address]    Report Status 11/06/2021 FINAL  Final  Acid Fast Smear (AFB)     Status: None   Collection Time: 11/04/21  8:20 AM   Specimen: Bronchial Alveolar Lavage  Result Value Ref Range Status   AFB Specimen Processing Concentration  Final   Acid Fast Smear Comment  Final  Comment: (NOTE) Testing is temporarily delayed  pending receipt of testing materials from the manufacturer. We apologize for any inconvenience. Performed At: [facility] [place], [place] [id number] Rush Farmer MD YW:[id number]    Source (AFB) BRONCHIAL ALVEOLAR LAVAGE  Final    Comment: Performed at [place] [address] [place], [place] [address]  Pneumocystis smear by DFA     Status: None   Collection Time: 11/04/21  8:20 AM   Specimen: Bronchial Alveolar Lavage; Respiratory  Result Value Ref Range Status   Specimen A [place] BRONCHIAL ALVEOLAR LAVAGE  Corrected    Comment: CORRECTED ON 05/20 AT 0948: PREVIOUSLY REPORTED AS SPUTUM RESPIRATORY CULTURE   Pneumocystis jiroveci Ag POSITIVE  Final    Comment: Performed at [place], READ BACK BY AND VERIFIED WITH: DEVIN, RN AT 2156 ON 11/04/2021 BY Crist Fat, MT Performed at [facility], [place] [address], [place], [place] [address]           Radiology Studies: No results found.      Scheduled Meds:  benztropine  0.5 mg Oral Q12H   bictegravir-emtricitabine-tenofovir AF  1 tablet Oral Daily   enoxaparin (LOVENOX) injection  40 mg Subcutaneous Q24H   feeding supplement  237 mL Oral TID BM   ferrous sulfate  325 mg Oral Q breakfast   fluconazole  200 mg Oral Daily   folic acid  1 mg Oral Daily   mouth rinse  15 mL Mouth Rinse BID   multivitamin with minerals  1 tablet Oral Daily   risperiDONE  2 mg Oral BID   [START ON 11/27/2021] sulfamethoxazole-trimethoprim  1 tablet Oral Once per day on Mon Wed Fri   sulfamethoxazole-trimethoprim  2 tablet Oral Q8H   traZODone  50 mg Oral QHS   Continuous Infusions:  sodium chloride 100 mL/hr at 11/08/21 0601     LOS: 6 days     Bonnielee Haff, MD [facility]   If 7PM-7AM, please contact night-coverage [email or web address]  11/08/2021, 11:49 AM
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
## 2021-11-08 NOTE — Progress Notes (Signed)
Assessed patient after administration of scheduled Ativan. Patient has been asleep since administration.

## 2021-11-09 MED ORDER — METOPROLOL TARTRATE 25 MG PO TABS
12.5000 mg | ORAL_TABLET | Freq: Two times a day (BID) | ORAL | Status: DC
  Administered 2021-11-09: 12.5 mg via ORAL
  Filled 2021-11-09: qty 1

## 2021-11-09 MED ORDER — CARBAMIDE PEROXIDE 6.5 % OT SOLN
5.0000 [drp] | Freq: Two times a day (BID) | OTIC | Status: AC
  Administered 2021-11-09 – 2021-11-12 (×7): 5 [drp] via OTIC
  Filled 2021-11-09: qty 15

## 2021-11-09 MED ORDER — METOPROLOL TARTRATE 5 MG/5ML IV SOLN
2.5000 mg | Freq: Four times a day (QID) | INTRAVENOUS | Status: DC | PRN
Start: 2021-11-09 — End: 2021-11-13
  Administered 2021-11-09 – 2021-11-10 (×2): 2.5 mg via INTRAVENOUS
  Filled 2021-11-09 (×3): qty 5

## 2021-11-09 MED ORDER — OLANZAPINE 5 MG PO TABS: 2.5000 mg | ORAL_TABLET | Freq: Every day | ORAL | Status: DC

## 2021-11-09 MED ORDER — LORAZEPAM 2 MG/ML IJ SOLN
1.0000 mg | Freq: Four times a day (QID) | INTRAMUSCULAR | Status: DC
  Administered 2021-11-09 – 2021-11-10 (×4): 1 mg via INTRAVENOUS
  Filled 2021-11-09 (×4): qty 1

## 2021-11-09 MED ORDER — BENZTROPINE MESYLATE 0.5 MG PO TABS: 0.5000 mg | ORAL_TABLET | Freq: Every day | ORAL | Status: DC

## 2021-11-09 MED ORDER — NICOTINE 7 MG/24HR TD PT24
7.0000 mg | MEDICATED_PATCH | Freq: Every day | TRANSDERMAL | Status: AC
  Administered 2021-11-09: 7 mg via TRANSDERMAL
  Filled 2021-11-09: qty 1

## 2021-11-09 MED ORDER — METOPROLOL TARTRATE 25 MG PO TABS
25.0000 mg | ORAL_TABLET | Freq: Two times a day (BID) | ORAL | Status: DC
  Administered 2021-11-09 – 2021-11-10 (×3): 25 mg via ORAL
  Filled 2021-11-09 (×3): qty 1

## 2021-11-09 NOTE — TOC Progression Note (Addendum)
Transition of Care [facility]) - Progression Note    Patient Details  Name: Jason Herman MRN: [id number] Date of Birth: Oct 24, 1985  Transition of [facility] CM/SW Contact  Karema Tocci, Meriam Sprague, RN Phone Number: 11/09/2021, 10:54 AM  Clinical Narrative:    Per MD pt is medically stable to go to inpt psych facility. [facility] CSW has been asked to review pt chart to see if they are able to accept pt. Pt will be faxed out to additional facilities if [facility] does not accept pt. Pt is voluntary and not IVC'd at this time.  TOC will continue to follow.  1300 Addendum: [facility] does not have a bed today. Pt faxed out to the following inpt psych hospitals: [facility]  [facility] will check back with [facility] daily as well  Expected Discharge Plan: Psychiatric Hospital Barriers to Discharge: Continued Medical Work up  Expected Discharge Plan and Services Expected Discharge Plan: Psychiatric Hospital   Discharge Planning Services: CM Consult   Readmission Risk Interventions    11/07/2021   11:14 AM  Readmission Risk Prevention Plan  Transportation Screening Complete  PCP or Specialist Appt within 3-5 Days Complete  HRI or Home Care Consult Complete  Social Work Consult for Recovery Care Planning/Counseling Complete  Palliative Care Screening Not Applicable  Medication Review [employer]) Complete

## 2021-11-09 NOTE — Progress Notes (Signed)
Pt is very paranoid at this time stating that he wants a different armband because it does not have the correct information. Pt does not think the doctors are giving him the correct medications and that we are in fact giving him different medications than he needs. Scheduled ativan was given although pt complained of burning at the injection site but would not let me remove the IV. Will continue to monitor patient.
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
## 2021-11-09 NOTE — Progress Notes (Signed)
Assessed patient about an hour after ativan administration. Patient seems a bit more awake, and is speaking more clearly than he previously was this morning before ativan administration.

## 2021-11-09 NOTE — Plan of Care (Signed)
  Problem: Activity: Goal: Risk for activity intolerance will decrease Outcome: Progressing   

## 2021-11-09 NOTE — Progress Notes (Addendum)
PROGRESS NOTE    FIELDS OROS  NOB:[id number] DOB: 28-May-1986 DOA: 11/02/2021 PCP: Patient, No Pcp Per (Inactive)   Brief Narrative: 36 year old with past medical history significant for HIV/AIDS with nonadherence with medication, paranoia, oral candidiasis who presented with altered mental status.  Patient was found to be obtunded and hypoxic on admission.  Subsequently he got agitated in the ED and received Geodon.  He was found to have 4 yellow pills  with him which were later identified as clonidine.  He was found to be febrile, hypotensive and hypoxic.  He was recently hospitalized from 5/8 until 5/11 for community-acquired pneumonia. Chest x-ray; mild diffuse interstitial and ill-defined opacities, potentially related to findings seen on recent CT chest.  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  Patient is started on treatment for PCP pneumonia.  He was also evaluated by psychiatry due to paranoid behavior and psychosis.  He was a started on risperidone and trazodone.  Psych was recommending on last evaluation inpatient psychiatric facility admission.     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 Complicated by underline psychiatric disorder.  -CT head: No acute intracranial abnormality.  Diffuse parenchymal atrophy, advanced in relation of patient's stated age. -Evaluated by ID, no need for LP at this time.  -He received Narcan on  admission  without significant improvement on MS.  -Patient with paranoid thought. Psych was consulted.  Slowly improving but continues to have episodes of agitation.  Psychiatry is managing for the most part.  2-Acute Hypoxic Respiratory failure on admission, PNA PCP  -He presented with oxygen saturation down to 70%.  He was placed on oxygen. -Weaned off of oxygen.  Saturating in the mid to late 90s on room air. -Received  IV ampicillin.  He was changed to cefdinir.  He has completed the course of same.  -Underwent Bronchoscopy 5/20, follow culture: no growth to date.  Pneumocystis  smear antigen positive, fungus, acid fast culture, pneumocystis PCR pending.  -Continue with  treatment for PCP pneumonia with Bactrim.  ID recommends 3-week course of Bactrim followed by Bactrim DS 3 times a week until CD4 count is greater than 200.  3-Paranoid, Confusion:  Per Psych Differential : Unspecified Psychotic Disorder Vs Schizophrenia Vs Delirium Vs Brief psychotic disorder.  Psychiatry is following closely.  Started on respite all, Cogentin and trazodone.  Continues to require Haldol as needed. -Initially was thought to require inpatient psychiatric care.  Will defer to psychiatry.   -Cannot Leave AMA.  Psychiatry is managing.  Noted to be on benztropine, Ativan intramuscularly every 6 hours, olanzapine, trazodone.  On haloperidol as needed.  Hypotension: Suspect related to clonidine overdose -He was found to have clonidine pills on him with unknown amount of ingestion. -Hypotensive initially received 4 L of IV fluids. -Blood pressure improved.  -Oral candidiasis: Continue with fluconazole.  He is on a 3-week course.  -HIV/AIDS CD4 count 5/80/2023 was 36. Not adherent to medication Started on [facility] 5/23.   Normocytic Anemia Anemia panel. : Iron deficiency anemia. Started  iron supplement.   Folic acid deficiency Started folic acid.   Sinus tachycardia Multifactorial.  Patient was  suspected of clonidine OD at admission. Upon further chart review it appears that he is not on clonidine on a regular basis.  Patient was started on metoprolol due to concern for rebound tachycardia.  However HR remains elevated. Discussed with RN. No agitation noted today. Patient is afebrile.   Reason for tachycardia remains unclear. He is asymptomatic for the most part and has been ambulating with sitter. Will check EKG. TSH was normal when checked recently. Recent med changes reviewed. Was started on Risperdal recently. Now stopped. Now on Zyprexa and Cogentin. Both can cause tachycardia. Will hold for today.  Metabolic acidosis, lactic acidosis.  Lactic acidosis  Could be related to infection, and hypoperfusion , Hypoxemia.  Resolved with IV fluids.   Mild hyponatremia Treated with IV fluids. Resolved.  Stop IV fluids.  DVT prophylaxis: Lovenox Code Status: Full code Family Communication: Discussed with patient's cousin yesterday with patient's permission Disposition Plan: Inpatient psychiatric hospitalization recommended by psychiatry  Status is: Inpatient Remains inpatient appropriate because: management of infection, PNA    Consultants:  CCM ID  Procedures:  None    Subjective: Denies any complaints this morning.  He was still complaining of some discomfort in the right ear.  Otoscopic examination does reveal cerumen in the right ear.  Debrox eardrops have been ordered.    Objective: Vitals:   11/08/21 1929 11/09/21 0724 11/09/21 0928 11/09/21 0931  BP: 108/70 112/73 116/72 116/72  Pulse: (!) 105 (!) 120  (!) 112  Resp:  17  18  Temp: 98.1 F (36.7 C) 98.2 F (36.8 C)  98.1 F (36.7 C)  TempSrc: Oral Oral  Oral  SpO2: 100% 97%  99%  Weight:      Height:        Intake/Output Summary (Last 24 hours) at 11/09/2021 1025 Last data filed at 11/09/2021 1008 Gross per 24 hour  Intake 240 ml  Output 3300 ml  Net -3060 ml    Filed Weights   11/02/21 1719  11/03/21 0139  Weight: 54 kg 54.5 kg    Examination:  General appearance: Awake alert.  In no distress.  Distracted Cerumen noted both ears.  Tympanic membranes noted to be somewhat dull in both ears. Resp: Clear to auscultation bilaterally.  Normal effort Cardio: S1-S2 is normal regular.  No S3-S4.  No rubs murmurs or bruit GI: Abdomen is soft.  Nontender nondistended.  Bowel sounds are present normal.  No masses organomegaly Extremities: No edema.  Full range of motion of lower extremities. Neurologic:   No focal neurological deficits.      Data Reviewed: I have personally reviewed following labs and imaging studies  CBC: Recent Labs  Lab 11/02/21 1646 11/03/21 0121 11/04/21 0244 11/05/21 0516 11/06/21 0612 11/07/21 1112 11/08/21 0513  WBC 6.1   < > 3.0* 3.8* 3.0* 3.7* 2.7*  NEUTROABS 4.5  --   --   --   --   --   --   HGB 9.4*   < > 7.2* 7.3* 7.9* 8.6* 8.1*  HCT 29.3*   < > 22.9* 22.8* 25.3* 26.5* 25.3*  MCV 98.7   < > 98.7 99.1 100.4* 99.3 99.6  PLT 231   < > 173 184 208 240 224   < > = values in this interval not displayed.    Basic Metabolic Panel: Recent Labs  Lab 11/03/21 0121 11/04/21 0244 11/05/21 9532  11/06/21 0612 11/08/21 0513 11/08/21 1424  NA 134* 138 139 136 135  --   K 4.2 3.5 4.2 4.5 4.3  --   CL 110 110 109 107 108  --   CO2 19* 23 23 21* 21*  --   GLUCOSE 103* 93 189* 80 88  --   BUN _0 --   CREATININE 0.71 0.81 0.76 0.89 0.73  --   CALCIUM 8.0* 8.0* 8.6* 8.6* 8.7*  --   PHOS  --   --   --   --   --  3.8    GFR: Estimated Creatinine Clearance: 98.4 mL/min (by C-G formula based on SCr of 0.73 mg/dL).  Liver Function Tests: Recent Labs  Lab 11/02/21 1646  AST 42*  ALT 33  ALKPHOS 53  BILITOT 0.7  PROT 7.6  ALBUMIN 2.6*     Recent Labs  Lab 11/02/21 1646  AMMONIA 24    Coagulation Profile: Recent Labs  Lab 11/02/21 1646  INR 1.2    Cardiac Enzymes: Recent Labs  Lab 11/02/21 1646  CKTOTAL 72      CBG: Recent Labs  Lab 11/03/21 0006 11/04/21 1938  GLUCAP 93 243*    Thyroid Function Tests: Recent Labs    11/07/21 1112  TSH 1.173    Sepsis Labs: Recent Labs  Lab 11/02/21 1646 11/02/21 1846 11/02/21 2235 11/03/21 0121  LATICACIDVEN 3.1* 3.0* 1.4 2.4*     Recent Results (from the past 240 hour(s))  Blood Culture (routine x 2)     Status: None   Collection Time: 11/02/21  4:55 PM   Specimen: BLOOD  Result Value Ref Range Status   Specimen Description   Final    BLOOD BLOOD LEFT FOREARM Performed at [facility], [place] [address], [place], [place] [address]    Special Requests   Final    BOTTLES DRAWN AEROBIC ONLY Blood Culture results may not be optimal due to an inadequate volume of blood received in culture bottles Performed at [place] [address], [place], [place] [address]    Culture   Final    NO GROWTH 5 DAYS Performed at [facility], [place] [address], [place], [place] [address]    Report Status 11/07/2021 FINAL  Final  Blood Culture (routine x 2)     Status: None   Collection Time: 11/02/21  5:05 PM   Specimen: BLOOD  Result Value Ref Range Status   Specimen Description   Final    BLOOD RIGHT ANTECUBITAL Performed at [place] [address], [place], [place] [address]    Special Requests   Final    BOTTLES DRAWN AEROBIC AND ANAEROBIC Blood Culture results may not be optimal due to an excessive volume of blood received in culture bottles Performed at [place] [address] [place], [place] [address]    Culture   Final    NO GROWTH 5 DAYS Performed at [facility], [place] [address], [place], [place] [address]    Report Status 11/07/2021 FINAL  Final  Resp Panel by RT-PCR (Flu A&B, Covid) Nasopharyngeal Swab     Status: None   Collection Time: 11/02/21  5:25 PM   Specimen: Nasopharyngeal Swab; Nasopharyngeal(NP) swabs in vial transport  medium  Result Value Ref Range Status   SARS Coronavirus 2 by RT PCR NEGATIVE NEGATIVE Final    Comment: (NOTE) SARS-CoV-2 target nucleic acids are  NOT DETECTED.  The SARS-CoV-2 RNA is generally detectable in upper respiratory specimens during the acute phase of infection. The lowest concentration of SARS-CoV-2 viral copies this assay can detect is 138 copies/mL. A negative result does not preclude SARS-Cov-2 infection and should not be used as the sole basis for treatment or other patient management decisions. A negative result may occur with  improper specimen collection/handling, submission of specimen other than nasopharyngeal swab, presence of viral mutation(s) within the areas targeted by this assay, and inadequate number of viral copies(<138 copies/mL). A negative result must be combined with clinical observations, patient history, and epidemiological information. The expected result is Negative.  Fact Sheet for Patients:  EntrepreneurPulse.com.au  Fact Sheet for Healthcare Providers:  IncredibleEmployment.be  This test is no t yet approved or cleared by the [place] FDA and  has been authorized for detection and/or diagnosis of SARS-CoV-2 by FDA under an Emergency Use Authorization (EUA). This EUA will remain  in effect (meaning this test can be used) for the duration of the COVID-19 declaration under Section 564(b)(1) of the Act, 21 U.S.C.section 360bbb-3(b)(1), unless the authorization is terminated  or revoked sooner.       Influenza A by PCR NEGATIVE NEGATIVE Final   Influenza B by PCR NEGATIVE NEGATIVE Final    Comment: (NOTE) The Xpert Xpress SARS-CoV-2/FLU/RSV plus assay is intended as an aid in the diagnosis of influenza from Nasopharyngeal swab specimens and should not be used as a sole basis for treatment. Nasal washings and aspirates are unacceptable for Xpert Xpress SARS-CoV-2/FLU/RSV testing.  Fact Sheet for  Patients: EntrepreneurPulse.com.au  Fact Sheet for Healthcare Providers: IncredibleEmployment.be  This test is not yet approved or cleared by the [place] FDA and has been authorized for detection and/or diagnosis of SARS-CoV-2 by FDA under an Emergency Use Authorization (EUA). This EUA will remain in effect (meaning this test can be used) for the duration of the COVID-19 declaration under Section 564(b)(1) of the Act, 21 U.S.C. section 360bbb-3(b)(1), unless the authorization is terminated or revoked.  Performed at [facility], [place] [address], [place], [place] [address]   Urine Culture     Status: None   Collection Time: 11/02/21  6:26 PM   Specimen: In/Out Cath Urine  Result Value Ref Range Status   Specimen Description   Final    IN/OUT CATH URINE Performed at [place] [address], [place], [place] [address]    Special Requests   Final    NONE Performed at [facility], [place] [address], [place], [place] [address]    Culture   Final    NO GROWTH Performed at [facility], [place] [address], [place], [place] [address]    Report Status 11/04/2021 FINAL  Final  MRSA Next Gen by PCR, Nasal     Status: None   Collection Time: 11/03/21 12:50 AM   Specimen: Nasal Mucosa; Nasal Swab  Result Value Ref Range Status   MRSA by PCR Next Gen NOT DETECTED NOT DETECTED Final    Comment: (NOTE) The GeneXpert MRSA Assay (FDA approved for NASAL specimens only), is one component of a comprehensive MRSA colonization surveillance program. It is not intended to diagnose MRSA infection nor to guide or monitor treatment for MRSA infections. Test performance is not FDA approved in patients less than 73 years old. Performed at [facility] [place], [place] [address] [place], [place] [address]   Culture, Respiratory w Gram Stain  Status: None   Collection Time: 11/04/21  8:20  AM   Specimen: Bronchoalveolar Lavage; Respiratory  Result Value Ref Range Status   Specimen Description   Final    BRONCHIAL ALVEOLAR LAVAGE Performed at [place] [address], [place], [place] [address]    Special Requests   Final    Immunocompromised Performed at [facility], [place] [address], [place], [place] [address]    Gram Stain   Final    WBC PRESENT, PREDOMINANTLY MONONUCLEAR SQUAMOUS EPITHELIAL CELLS PRESENT NO ORGANISMS SEEN CYTOSPIN SMEAR    Culture   Final    NO GROWTH 2 DAYS Performed at [facility], [place] [address] [place], [place] [address]    Report Status 11/06/2021 FINAL  Final  Acid Fast Smear (AFB)     Status: None   Collection Time: 11/04/21  8:20 AM   Specimen: Bronchial Alveolar Lavage  Result Value Ref Range Status   AFB Specimen Processing Concentration  Final   Acid Fast Smear Comment  Final    Comment: (NOTE) Testing is temporarily delayed pending receipt of testing materials from the manufacturer. We apologize for any inconvenience. Performed At: [facility] [place], [place] [id number] Rush Farmer MD UX:[id number]    Source (AFB) BRONCHIAL ALVEOLAR LAVAGE  Final    Comment: Performed at [place] [address] [place], [place] [address]  Pneumocystis smear by DFA     Status: None   Collection Time: 11/04/21  8:20 AM   Specimen: Bronchial Alveolar Lavage; Respiratory  Result Value Ref Range Status   Specimen [facility] BRONCHIAL ALVEOLAR LAVAGE  Corrected    Comment: CORRECTED ON 05/20 AT 0948: PREVIOUSLY REPORTED AS SPUTUM RESPIRATORY CULTURE   Pneumocystis jiroveci Ag POSITIVE  Final    Comment: Performed at [place], READ BACK BY AND VERIFIED WITH: DEVIN, RN AT 2156 ON 11/04/2021 BY Crist Fat, MT Performed at [facility], [place] [address], [place], [place] [address]            Radiology Studies: No results found.      Scheduled Meds:  benztropine  0.5 mg Oral Q12H   bictegravir-emtricitabine-tenofovir AF  1 tablet Oral Daily   carbamide peroxide  5 drop Both EARS BID   enoxaparin (LOVENOX) injection  40 mg Subcutaneous Q24H   feeding supplement  237 mL Oral TID BM   ferrous sulfate  325 mg Oral Q breakfast   fluconazole  200 mg Oral Daily   folic acid  1 mg Oral Daily   LORazepam  1 mg Intramuscular Q6H   mouth rinse  15 mL Mouth Rinse BID   metoprolol tartrate  12.5 mg Oral BID   multivitamin with minerals  1 tablet Oral Daily   OLANZapine  2.5 mg Oral QHS   [START ON 11/27/2021] sulfamethoxazole-trimethoprim  1 tablet Oral Once per day on Mon Wed Fri   sulfamethoxazole-trimethoprim  2 tablet Oral Q8H   thiamine  100 mg Oral Daily   traZODone  50 mg Oral QHS   Continuous Infusions:  sodium chloride 100 mL/hr at 11/09/21 0758     LOS: 7 days     Bonnielee Haff, MD [facility]   If 7PM-7AM, please contact night-coverage [email or web address]  11/09/2021, 10:25 AM

## 2021-11-09 NOTE — Progress Notes (Signed)
Pt is laying in bed, ready to to get his meds for the night. Pt had spent some time in the bathroom after his ativan dose but seems calmer for now. Pt is ready to take all of his evening medications.

## 2021-11-10 ENCOUNTER — Inpatient Hospital Stay (HOSPITAL_COMMUNITY): Payer: Self-pay

## 2021-11-10 LAB — CBC
HCT: 28.5 % — ABNORMAL LOW (ref 39.0–52.0)
Hemoglobin: 9.2 g/dL — ABNORMAL LOW (ref 13.0–17.0)
MCH: 32.1 pg (ref 26.0–34.0)
MCHC: 32.3 g/dL (ref 30.0–36.0)
MCV: 99.3 fL (ref 80.0–100.0)
Platelets: 272 10*3/uL (ref 150–400)
RBC: 2.87 MIL/uL — ABNORMAL LOW (ref 4.22–5.81)
RDW: 16.7 % — ABNORMAL HIGH (ref 11.5–15.5)
WBC: 3.5 10*3/uL — ABNORMAL LOW (ref 4.0–10.5)
nRBC: 0 % (ref 0.0–0.2)

## 2021-11-10 LAB — MAGNESIUM: Magnesium: 2.1 mg/dL (ref 1.7–2.4)

## 2021-11-10 LAB — BASIC METABOLIC PANEL
Anion gap: 9 (ref 5–15)
BUN: 12 mg/dL (ref 6–20)
CO2: 22 mmol/L (ref 22–32)
Calcium: 9.2 mg/dL (ref 8.9–10.3)
Chloride: 102 mmol/L (ref 98–111)
Creatinine, Ser: 1.01 mg/dL (ref 0.61–1.24)
GFR, Estimated: >60 mL/min (ref >60)
Glucose, Bld: 149 mg/dL — ABNORMAL HIGH (ref 70–99)
Potassium: 4.1 mmol/L (ref 3.5–5.1)
Sodium: 133 mmol/L — ABNORMAL LOW (ref 135–145)

## 2021-11-10 LAB — CK: Total CK: 27 U/L — ABNORMAL LOW (ref 49–397)

## 2021-11-10 LAB — ACID FAST SMEAR (AFB, MYCOBACTERIA): Acid Fast Smear: NEGATIVE

## 2021-11-10 LAB — PNEUMOCYSTIS PCR: Result Pneumocystis PCR: POSITIVE — AB

## 2021-11-10 LAB — VITAMIN B1: Vitamin B1 (Thiamine): 83.9 nmol/L (ref 66.5–200.0)

## 2021-11-10 MED ORDER — LORAZEPAM 2 MG/ML IJ SOLN
1.0000 mg | Freq: Three times a day (TID) | INTRAMUSCULAR | Status: DC
Start: 2021-11-10 — End: 2021-11-11
  Administered 2021-11-10 – 2021-11-11 (×2): 1 mg via INTRAVENOUS
  Filled 2021-11-10 (×2): qty 1

## 2021-11-10 MED ORDER — OLANZAPINE 5 MG PO TABS
5.0000 mg | ORAL_TABLET | Freq: Every day | ORAL | Status: DC
  Administered 2021-11-10 – 2021-11-12 (×3): 5 mg via ORAL
  Filled 2021-11-10 (×3): qty 1

## 2021-11-10 NOTE — TOC Progression Note (Signed)
Transition of Care [facility]) - Progression Note    Patient Details  Name: Jason Herman MRN: [id number] Date of Birth: 06/10/1986  Transition of Care [facility]) CM/SW Contact  Coralyn Helling, [place] Phone Number: 11/10/2021, 12:10 PM  Clinical Narrative:   No beds at [facility]. Patient faxed out to:  [facility]  [facility] will continue to follow for placement.     Expected Discharge Plan: Psychiatric Hospital Barriers to Discharge: Continued Medical Work up  Expected Discharge Plan and Services Expected Discharge Plan: Psychiatric Hospital   Discharge Planning Services: CM Consult                                           Social Determinants of Health (SDOH) Interventions    Readmission Risk Interventions    11/07/2021   11:14 AM  Readmission Risk Prevention Plan  Transportation Screening Complete  PCP or Specialist Appt within 3-5 Days Complete  HRI or Home Care Consult Complete  Social Work Consult for Recovery Care Planning/Counseling Complete  Palliative Care Screening Not Applicable  Medication Review [employer]) Complete
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
## 2021-11-10 NOTE — Progress Notes (Signed)
24 hour chart audit completed 

## 2021-11-10 NOTE — Progress Notes (Signed)
Pt is sitting comfortably in the room and awake but he does appear calmer. Will continue to monitor patient.

## 2021-11-10 NOTE — Progress Notes (Signed)
Patient noted lying down on bench in room watching television. Scheduled IV Ativan administered. Sitter remains in place. HR 111

## 2021-11-10 NOTE — Progress Notes (Signed)
Patient noted sitting on side of bed, just finished eating breakfast. Patient stated that he is "feeling better than before".  HR 123.

## 2021-11-10 NOTE — Progress Notes (Signed)
PROGRESS NOTE    Jason Herman  PJA:[id number] DOB: January 24, 1986 DOA: 11/02/2021 PCP: Patient, No Pcp Per (Inactive)   Brief Narrative: 36 year old with past medical history significant for HIV/AIDS with nonadherence with medication, paranoia, oral candidiasis who presented with altered mental status.  Patient was found to be obtunded and hypoxic on admission.  Subsequently he got agitated in the ED and received Geodon.  He was found to have 4 yellow pills  with him which were later identified as clonidine.  He was found to be febrile, hypotensive and hypoxic.  He was recently hospitalized from 5/8 until 5/11 for community-acquired pneumonia. Chest x-ray; mild diffuse interstitial and ill-defined opacities, potentially related to findings seen on recent CT chest.  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  Patient is started on treatment for PCP pneumonia.  He was also evaluated by psychiatry due to paranoid behavior and psychosis.  He was a started on risperidone and trazodone.  Psych was recommending on last evaluation inpatient psychiatric facility admission.     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 Complicated by underline psychiatric disorder.  -CT head: No acute intracranial abnormality.  Diffuse parenchymal atrophy, advanced in relation of patient's stated age. -He received Narcan on admission  without significant improvement on MS.   -Patient with paranoid thought. Psych was consulted.  Slowly improving but continues to have episodes of agitation.  Psychiatry is managing for the most part. No focal neurological deficits on examination.  Acute Hypoxic Respiratory failure on admission, PNA PCP  -He presented with oxygen saturation down to 70%.  He was placed on oxygen. -Weaned off of oxygen.  Saturating in the mid to late 90s on room air. -Received  IV ampicillin.  He was changed to cefdinir.  He has completed the course of same.  -Underwent Bronchoscopy 5/20, follow culture: no growth to date.  Pneumocystis  smear antigen positive, fungus, acid fast culture, pneumocystis PCR pending.  -Continue with  treatment for PCP pneumonia with Bactrim.  ID recommends 3-week course of Bactrim followed by Bactrim DS 3 times a week until CD4 count is greater than 200.  Paranoid, Confusion:  Per Psych Differential : Unspecified Psychotic Disorder Vs Schizophrenia Vs Delirium Vs Brief psychotic disorder.  Psychiatry is following closely.  Started on respite all, Cogentin and trazodone.  Continues to require Haldol as needed. -Initially was thought to require inpatient psychiatric care.  Will defer to psychiatry.   -Cannot Leave AMA.  Psychiatry is managing.  Noted to be on benztropine, Ativan intramuscularly every 6 hours, olanzapine, trazodone.  On haloperidol as needed. Olanzapine and benztropine was held yesterday due to persistent sinus tachycardia.  Heart rate seems to be better today.  Doses have been adjusted.  Hypotension: Suspect related to clonidine overdose -He was found to have clonidine pills on him with unknown amount of ingestion. -Hypotensive initially and received 4 L of IV fluids. -Blood pressure improved.  Sinus tachycardia Likely multifactorial.  Patient was suspected of clonidine OD at admission. Upon further chart review it appears  that he is not on clonidine on a regular basis.  Patient was started on metoprolol  due to concern for rebound tachycardia.  Heart rate remained persistently elevated.  No significant agitation was noted. Reason for tachycardia remains unclear. He is asymptomatic for the most part and has been ambulating with sitter.  EKG confirms sinus tachycardia.  TSH was normal when checked recently.  Chest x-ray was done this morning and shows normal heart size. Recent med changes reviewed. Was started on Risperdal recently. Now stopped. Now on Zyprexa and Cogentin. Both can cause tachycardia.  These medications were held yesterday.  Heart rate seems to be slightly better. May resume these medications and continue to monitor.  Oral candidiasis Continue with fluconazole.  He is on a 3-week course.  HIV/AIDS CD4 count 5/80/2023 was 36. Not adherent to medication Started on [facility] 5/23.   Normocytic Anemia Anemia panel revealed Iron deficiency.  Started on iron supplements.  Folic acid deficiency Started folic acid.   Metabolic acidosis, lactic acidosis Could be related to infection, and hypoperfusion, Hypoxemia.  Resolved with IV fluids.   Mild hyponatremia Stable  Cerumen Wax noted in both ears.  Debrox eardrops ordered.  DVT prophylaxis: Lovenox Code Status: Full code Family Communication: Discussed with patient.  No family at bedside. Disposition Plan: Inpatient psychiatric hospitalization recommended by psychiatry  Status is: Inpatient Remains inpatient appropriate because: management of infection, PNA    Consultants:  CCM ID  Procedures:  None    Subjective: Sitting on the side of the bed eating his breakfast.  Denies any complaints.  Ears feel better.    Objective: Vitals:   11/09/21 2133 11/09/21 2349 11/10/21 0349 11/10/21 0657  BP: (!) 97/54 111/70 99/66 119/79  Pulse: (!) 113 (!) 102 (!) 107 100  Resp: _0 Temp: 98.6 F (37 C) 97.9 F (36.6 C) 98.5 F (36.9 C) 98.2 F (36.8 C)  TempSrc: Oral Oral Oral Oral  SpO2: 98% 98% 99%  100%  Weight:      Height:        Intake/Output Summary (Last 24 hours) at 11/10/2021 1017 Last data filed at 11/10/2021 0755 Gross per 24 hour  Intake 240 ml  Output 1550 ml  Net -1310 ml    Filed Weights   11/02/21 1719 11/03/21 0139  Weight: 54 kg 54.5 kg    Examination:  General appearance: Awake alert.  In no distress and is distracted Resp: Clear to auscultation bilaterally.  Normal effort Cardio: S1-S2 is tachycardic regular.  No S3-S4. GI: Abdomen is soft.  Nontender nondistended.  Bowel sounds are present normal.  No masses organomegaly Extremities: No edema.  Full range of motion of lower extremities. Neurologic: Alert and oriented x3.  No focal neurological deficits.      Data Reviewed: I have personally reviewed following labs and imaging studies  CBC: Recent Labs  Lab 11/05/21 0516 11/06/21 0612 11/07/21 1112 11/08/21 0513 11/10/21 0704  WBC 3.8* 3.0* 3.7* 2.7* 3.5*  HGB 7.3* 7.9* 8.6* 8.1* 9.2*  HCT 22.8* 25.3* 26.5* 25.3* 28.5*  MCV 99.1 100.4* 99.3 99.6 99.3  PLT 184 208 240 224 801    Basic Metabolic Panel: Recent Labs  Lab 11/04/21 0244 11/05/21 0516 11/06/21 0612 11/08/21 0513 11/08/21 1424 11/10/21 0704  NA 138 139 136 135  --  133*  K 3.5 4.2 4.5 4.3  --  4.1  CL 110 109 107 108  --  102  CO2 23 23 21* 21*  --  22  GLUCOSE 93 189* 80 88  --  149*  BUN _0 --  12  CREATININE 0.81 0.76 0.89 0.73  --  1.01  CALCIUM 8.0* 8.6* 8.6* 8.7*  --  9.2  MG  --   --   --   --   --  2.1  PHOS  --   --   --   --  3.8  --     GFR: Estimated Creatinine Clearance: 77.9 mL/min (by C-G formula based on SCr of 1.01 mg/dL).  CBG: Recent Labs  Lab 11/04/21 1938  GLUCAP 243*    Thyroid Function Tests: Recent Labs    11/07/21 1112  TSH 1.173    Recent Results (from the past 240 hour(s))  Blood Culture (routine x 2)     Status: None   Collection Time: 11/02/21  4:55 PM   Specimen: BLOOD  Result Value Ref Range Status    Specimen Description   Final    BLOOD BLOOD LEFT FOREARM Performed at [facility] [address] [place], [place] [address]    Special Requests   Final    BOTTLES DRAWN AEROBIC ONLY Blood Culture results may not be optimal due to an inadequate volume of blood received in culture bottles Performed at [facility] [address] [place], [place] [address]    Culture   Final    NO GROWTH 5 DAYS Performed at [facility], [place] [address], [place], [place] [address]    Report Status 11/07/2021 FINAL  Final  Blood Culture (routine x 2)     Status: None   Collection Time: 11/02/21  5:05 PM   Specimen: BLOOD  Result Value Ref Range Status   Specimen Description   Final    BLOOD RIGHT ANTECUBITAL Performed at [facility] [address] [place], [place] [address]    Special Requests   Final    BOTTLES DRAWN AEROBIC AND ANAEROBIC Blood Culture results may not be optimal due to an excessive volume of blood received in culture bottles Performed at [facility] [address], [place], [place] [address]    Culture   Final    NO GROWTH 5 DAYS Performed at [facility], [place] [address] [place], [place] [address]    Report Status 11/07/2021 FINAL  Final  Resp Panel by RT-PCR (Flu A&B, Covid) Nasopharyngeal Swab     Status: None   Collection Time: 11/02/21  5:25 PM   Specimen: Nasopharyngeal Swab; Nasopharyngeal(NP) swabs in vial transport medium  Result Value Ref Range Status   SARS Coronavirus 2 by RT PCR NEGATIVE NEGATIVE Final    Comment: (NOTE) SARS-CoV-2 target nucleic acids are NOT DETECTED.  The SARS-CoV-2 RNA is generally detectable in upper respiratory specimens during the acute phase of infection. The lowest concentration of SARS-CoV-2 viral copies this assay can detect is 138 copies/mL. A negative result does not preclude SARS-Cov-2 infection and should not be used as the sole basis for  treatment or other patient management decisions. A negative result may occur with  improper specimen collection/handling, submission of specimen other than nasopharyngeal swab, presence of viral mutation(s) within the areas targeted by this assay, and inadequate number of viral copies(<138 copies/mL). A negative result must be combined with clinical observations, patient history, and epidemiological information. The expected result is Negative.  Fact Sheet for Patients:  EntrepreneurPulse.com.au  Fact Sheet for Healthcare Providers:  IncredibleEmployment.be  This test is no t yet approved or cleared by the [place] and  has been authorized for detection and/or diagnosis of SARS-CoV-2 by FDA under an Emergency Use Authorization (EUA). This EUA will remain  in effect (meaning this test can be used) for the duration of the COVID-19 declaration under Section 564(b)(1) of the Act, 21 U.S.C.section 360bbb-3(b)(1), unless the authorization is terminated  or revoked sooner.       Influenza A by PCR NEGATIVE NEGATIVE Final   Influenza B by PCR NEGATIVE NEGATIVE Final    Comment: (NOTE) The Xpert Xpress SARS-CoV-2/FLU/RSV plus assay is intended as an aid in the diagnosis of influenza from Nasopharyngeal swab specimens and should not be used as a sole basis for treatment. Nasal washings and aspirates are unacceptable for Xpert Xpress SARS-CoV-2/FLU/RSV testing.  Fact Sheet for Patients: EntrepreneurPulse.com.au  Fact Sheet for Healthcare Providers: IncredibleEmployment.be  This test is not yet approved or cleared by the [place] FDA and has been authorized for detection and/or diagnosis of SARS-CoV-2 by FDA under an Emergency Use Authorization (EUA). This EUA will remain in effect (meaning this test can be used) for the duration of the COVID-19 declaration under Section 564(b)(1) of the Act, 21  U.S.C. section 360bbb-3(b)(1), unless the authorization is terminated or revoked.  Performed at [facility], [place] [address], [place], [place] [address]   Urine Culture     Status: None   Collection Time: 11/02/21  6:26 PM   Specimen: In/Out Cath Urine  Result Value Ref Range Status   Specimen Description   Final    IN/OUT CATH URINE Performed at [facility] [address], [place], [place] [address]    Special Requests   Final    NONE Performed at [facility], [place] [address], [place], [place] [address]    Culture   Final    NO GROWTH Performed at [facility], [place] [address], [place], [place] [address]    Report Status 11/04/2021 FINAL  Final  MRSA Next Gen by PCR, Nasal     Status: None   Collection Time: 11/03/21 12:50 AM   Specimen: Nasal Mucosa; Nasal Swab  Result Value Ref Range Status   MRSA by PCR Next Gen NOT DETECTED NOT DETECTED Final    Comment: (NOTE) The GeneXpert MRSA Assay (FDA approved for NASAL specimens only), is one component of a comprehensive MRSA colonization surveillance program. It is not intended to diagnose MRSA infection nor to guide or monitor treatment for MRSA infections. Test performance is not FDA approved in patients less than 47 years old. Performed at [facility], [place] [address] [place], [place] [address]   Culture, Respiratory w Gram Stain     Status: None   Collection Time: 11/04/21  8:20 AM   Specimen: Bronchoalveolar Lavage; Respiratory  Result Value Ref Range Status   Specimen Description   Final    BRONCHIAL ALVEOLAR LAVAGE Performed at [facility] [address], [place], [place] [address]    Special Requests   Final    Immunocompromised Performed at [facility], [place] [address] [place], [place] [address]    Gram Stain   Final    WBC PRESENT, PREDOMINANTLY MONONUCLEAR SQUAMOUS EPITHELIAL CELLS  PRESENT NO ORGANISMS SEEN CYTOSPIN SMEAR    Culture   Final    NO GROWTH 2 DAYS Performed at [facility], [place] [address] [place], [place] [address]  Report Status 11/06/2021 FINAL  Final  Acid Fast Smear (AFB)     Status: None   Collection Time: 11/04/21  8:20 AM   Specimen: Bronchial Alveolar Lavage  Result Value Ref Range Status   AFB Specimen Processing Concentration  Final   Acid Fast Smear Comment  Final    Comment: (NOTE) Testing is temporarily delayed pending receipt of testing materials from the manufacturer. We apologize for any inconvenience. Performed At: [facility] [place], [place] [id number] Rush Farmer MD [id number]    Source (AFB) BRONCHIAL ALVEOLAR LAVAGE  Final    Comment: Performed at [facility] [address], [place], [place] [address]  Pneumocystis smear by DFA     Status: None   Collection Time: 11/04/21  8:20 AM   Specimen: Bronchial Alveolar Lavage; Respiratory  Result Value Ref Range Status   Specimen [facility] BRONCHIAL ALVEOLAR LAVAGE  Corrected    Comment: CORRECTED ON 05/20 AT 0948: PREVIOUSLY REPORTED AS SPUTUM RESPIRATORY CULTURE   Pneumocystis jiroveci Ag POSITIVE  Final    Comment: Performed at [facility], READ BACK BY AND VERIFIED WITH: DEVIN, RN AT 2156 ON 11/04/2021 BY Crist Fat, MT Performed at [facility], [place] [address], [place],  [address]           Radiology Studies: DG CHEST PORT 1 VIEW  Result Date: 11/10/2021 CLINICAL DATA:  Pneumonia.  HIV. EXAM: PORTABLE CHEST 1 VIEW COMPARISON:  One-view chest x-ray 11/02/2021 FINDINGS: Heart size is normal. Upper lobe airspace opacities are present scratched at subtle upper lobe airspace disease are present bilaterally. Lung bases are clear. IMPRESSION: Subtle bilateral upper lobe airspace disease compatible with pneumonia. This may represent atypical infection.  Edema is considered less likely. Electronically Signed   By: [place] Morelle M.D.   On: 11/10/2021 09:18        Scheduled Meds:  benztropine  0.5 mg Oral QHS   bictegravir-emtricitabine-tenofovir AF  1 tablet Oral Daily   carbamide peroxide  5 drop Both EARS BID   enoxaparin (LOVENOX) injection  40 mg Subcutaneous Q24H   feeding supplement  237 mL Oral TID BM   ferrous sulfate  325 mg Oral Q breakfast   fluconazole  200 mg Oral Daily   folic acid  1 mg Oral Daily   LORazepam  1 mg Intravenous Q6H   mouth rinse  15 mL Mouth Rinse BID   metoprolol tartrate  25 mg Oral BID   multivitamin with minerals  1 tablet Oral Daily   nicotine  7 mg Transdermal Daily   OLANZapine  2.5 mg Oral QHS   [START ON 11/27/2021] sulfamethoxazole-trimethoprim  1 tablet Oral Once per day on Mon Wed Fri   sulfamethoxazole-trimethoprim  2 tablet Oral Q8H   thiamine  100 mg Oral Daily   traZODone  50 mg Oral QHS   Continuous Infusions:     LOS: 8 days     Bonnielee Haff, MD [facility]   If 7PM-7AM, please contact night-coverage [email or web address]  11/10/2021, 10:17 AM

## 2021-11-10 NOTE — Progress Notes (Signed)
Patient resting in bed after administration of IV Ativan.

## 2021-11-10 NOTE — Progress Notes (Signed)
Pt laying in bed prior to ativan administration. Speech is more difficult to understand and pt remains as paranoid as in the beginning of shift.

## 2021-11-10 NOTE — Progress Notes (Signed)
Patient noted lying in bed watching television 1hr post IV Ativan administration. HR 111.

## 2021-11-11 MED ORDER — LORAZEPAM 1 MG PO TABS
1.0000 mg | ORAL_TABLET | Freq: Three times a day (TID) | ORAL | Status: DC
  Administered 2021-11-11 – 2021-11-13 (×6): 1 mg via ORAL
  Filled 2021-11-11 (×6): qty 1

## 2021-11-11 MED ORDER — METOPROLOL TARTRATE 25 MG PO TABS
12.5000 mg | ORAL_TABLET | Freq: Two times a day (BID) | ORAL | Status: DC
  Administered 2021-11-11 – 2021-11-13 (×5): 12.5 mg via ORAL
  Filled 2021-11-11 (×5): qty 1
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
## 2021-11-11 NOTE — Progress Notes (Signed)
   11/11/21 1313  Assess: MEWS Score  Temp (!) 97.5 F (36.4 C)  BP 116/82  Pulse Rate (!) 113  Resp 18  SpO2 97 %  O2 Device Room Air  Assess: MEWS Score  MEWS Temp 0  MEWS Systolic 0  MEWS Pulse 2  MEWS RR 0  MEWS LOC 0  MEWS Score 2  MEWS Score Color Yellow  Assess: if the MEWS score is Yellow or Red  Were vital signs taken at a resting state? Yes  Focused Assessment No change from prior assessment  Does the patient meet 2 or more of the SIRS criteria? No  Does the patient have a confirmed or suspected source of infection? No  MEWS guidelines implemented *See Row Information* No, vital signs rechecked  Assess: SIRS CRITERIA  SIRS Temperature  0  SIRS Pulse 1  SIRS Respirations  0  SIRS WBC 0  SIRS Score Sum  1

## 2021-11-11 NOTE — Progress Notes (Signed)
PROGRESS NOTE    Jason Herman  BJY:[id number] DOB: 1985/12/13 DOA: 11/02/2021 PCP: Patient, No Pcp Per (Inactive)   Brief Narrative: 36 year old with past medical history significant for HIV/AIDS with nonadherence with medication, paranoia, oral candidiasis who presented with altered mental status.  Patient was found to be obtunded and hypoxic on admission.  Subsequently he got agitated in the ED and received Geodon.  He was found to have 4 yellow pills  with him which were later identified as clonidine.  He was found to be febrile, hypotensive and hypoxic.  He was recently hospitalized from 5/8 until 5/11 for community-acquired pneumonia. Chest x-ray; mild diffuse interstitial and ill-defined opacities, potentially related to findings seen on recent CT chest.  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  Patient is started on treatment for PCP pneumonia.  He was also evaluated by psychiatry due to paranoid behavior and psychosis.  He was a started on risperidone and trazodone.  Psych was recommending on last evaluation inpatient psychiatric facility admission.     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 Complicated by underline psychiatric disorder.  -CT head: No acute intracranial abnormality.  Diffuse parenchymal atrophy, advanced in relation of patient's stated age. -He received Narcan on admission without significant improvement on MS.   -Patient with paranoid thought. Psych was consulted.  Psychiatry continues to follow.  He remains on parenteral lorazepam.  No focal neurological deficits.  No significant agitation noted in the last 48 hours.  Acute Hypoxic Respiratory failure on admission, PNA PCP  -He presented with oxygen saturation down to 70%.  He was placed on oxygen. -Weaned off of oxygen.  Saturating in the mid to late 90s on room air. -Received  IV ampicillin.  He was changed to cefdinir.  He has completed the course of same.  -Underwent Bronchoscopy 5/20, follow culture: no growth to date.  Pneumocystis  smear antigen positive, fungus, acid fast culture, pneumocystis PCR pending.  -Continue with  treatment for PCP pneumonia with Bactrim.  ID recommends 3-week course of Bactrim (until 11/24/2021) followed by Bactrim DS 3 times a week (starting 11/27/2021) until CD4 count is greater than 200.  Paranoid, Confusion:  Per Psych Differential : Unspecified Psychotic Disorder Vs Schizophrenia Vs Delirium Vs Brief psychotic disorder.  Psychiatry is following closely.  Started on respite all, Cogentin and trazodone.  Continues to require Haldol as needed. -Initially was thought to require inpatient psychiatric care.  Will defer to psychiatry.   -Cannot Leave AMA.  Psychiatry is managing.  Placed on scheduled lorazepam which is being continued.  As needed Haldol was discontinued.  Benztropine was discontinued.  Zyprexa dose was increased.  Remains also on trazodone.    Hypotension: Suspect related to clonidine overdose -He was found to have clonidine pills on him with unknown amount of ingestion. -Hypotensive initially and received 4 L of IV fluids. -Blood pressure improved.  Sinus tachycardia Likely multifactorial.  Patient was suspected of clonidine OD at admission.  Patient was started on metoprolol due to concern for rebound tachycardia.  Upon further chart review it appears that he is not on clonidine on a regular basis.   Heart rate remained persistently elevated.  No significant agitation was noted. Reason for tachycardia remains unclear. He is asymptomatic for the most part and has been ambulating with sitter.  EKG confirms sinus tachycardia.  TSH was normal when checked recently.  Chest x-ray was done this morning and shows normal heart size. Recent med changes reviewed. Was started on Risperdal recently. Now stopped. Now on Zyprexa and Cogentin. Both can cause tachycardia.  These medications were held briefly.  Cogentin has been stopped.  Zyprexa has been continued.  Heart rate is improved.  Seems to be more close to his baseline now.  He remains asymptomatic.  Amatory can be discontinued now that he is no longer requiring Haldol and heart rate has improved.  Oral candidiasis Continue with fluconazole.  He is on a 3-week course, last dose will be on 11/24/2021.  HIV/AIDS CD4 count 5/80/2023 was 36. Not adherent to medication Started on [facility] 5/23.   Normocytic Anemia Anemia panel revealed Iron deficiency.  Started on iron supplements.  Folic acid deficiency Started folic acid.   Metabolic acidosis, lactic acidosis Could be related to infection, and hypoperfusion, Hypoxemia.  Resolved with IV fluids.   Mild hyponatremia Stable  Cerumen Wax noted in both ears.  Debrox eardrops ordered.  DVT prophylaxis: Lovenox Code Status: Full code Family Communication: Discussed with patient.  No family at bedside. Disposition Plan: Inpatient psychiatric hospitalization recommended by psychiatry.  Waiting on bed availability.  Status is: Inpatient Remains inpatient appropriate because: management of infection, PNA    Consultants:  CCM ID  Procedures:  None    Subjective: Sitting on the side of the bed eating his breakfast.  Wants to shower today.  Requesting telemetry be discontinued.  Denies any chest pain shortness of breath.  Objective: Vitals:   11/10/21 1817 11/10/21 1838 11/10/21 2114  11/11/21 0102  BP:   103/67 91/65  Pulse: (!) 130 100 (!) 121 (!) 107  Resp:   18 18  Temp:   98.8 F (37.1 C) 97.9 F (36.6 C)  TempSrc:   Oral Oral  SpO2:   99% 99%  Weight:      Height:        Intake/Output Summary (Last 24 hours) at 11/11/2021 0903 Last data filed at 11/10/2021 1743 Gross per 24 hour  Intake 480 ml  Output --  Net 480 ml    Filed Weights   11/02/21 1719 11/03/21 0139  Weight: 54 kg 54.5 kg    Examination:  General appearance: Awake alert.  In no distress Resp: Clear to auscultation bilaterally.  Normal effort Cardio: S1-S2 is tachycardic regular GI: Abdomen is soft.  Nontender nondistended.  Bowel sounds are present normal.  No masses organomegaly Extremities: No edema.  Full range of motion of lower extremities. Neurologic: No focal neurological deficits.       Data Reviewed: I have personally reviewed following labs and imaging studies  CBC: Recent Labs  Lab 11/05/21 0516 11/06/21 0612 11/07/21 1112 11/08/21 0513 11/10/21 0704  WBC 3.8* 3.0* 3.7* 2.7* 3.5*  HGB 7.3* 7.9* 8.6* 8.1* 9.2*  HCT 22.8* 25.3* 26.5* 25.3* 28.5*  MCV 99.1 100.4* 99.3 99.6 99.3  PLT 184 208 240 224 436    Basic Metabolic Panel: Recent Labs  Lab 11/05/21 0516 11/06/21 0612 11/08/21 0513 11/08/21 1424 11/10/21 0704  NA 139 136 135  --  133*  K 4.2 4.5  4.3  --  4.1  CL 109 107 108  --  102  CO2 23 21* 21*  --  22  GLUCOSE 189* 80 88  --  149*  BUN _0 --  12  CREATININE 0.76 0.89 0.73  --  1.01  CALCIUM 8.6* 8.6* 8.7*  --  9.2  MG  --   --   --   --  2.1  PHOS  --   --   --  3.8  --     GFR: Estimated Creatinine Clearance: 77.9 mL/min (by C-G formula based on SCr of 1.01 mg/dL).  CBG: Recent Labs  Lab 11/04/21 1938  GLUCAP 243*    Recent Results (from the past 240 hour(s))  Blood Culture (routine x 2)     Status: None   Collection Time: 11/02/21  4:55 PM   Specimen: BLOOD  Result Value Ref Range Status   Specimen Description    Final    BLOOD BLOOD LEFT FOREARM Performed at [facility] [address], [place], [place] [address]    Special Requests   Final    BOTTLES DRAWN AEROBIC ONLY Blood Culture results may not be optimal due to an inadequate volume of blood received in culture bottles Performed at [facility] [address], [place], [place] [address]    Culture   Final    NO GROWTH 5 DAYS Performed at [facility], [place] [address], [place], [place] [address]    Report Status 11/07/2021 FINAL  Final  Blood Culture (routine x 2)     Status: None   Collection Time: 11/02/21  5:05 PM   Specimen: BLOOD  Result Value Ref Range Status   Specimen Description   Final    BLOOD RIGHT ANTECUBITAL Performed at [facility] [address] [place], [place] [address]    Special Requests   Final    BOTTLES DRAWN AEROBIC AND ANAEROBIC Blood Culture results may not be optimal due to an excessive volume of blood received in culture bottles Performed at [facility] [address], [place], [place] [address]    Culture   Final    NO GROWTH 5 DAYS Performed at [facility], [place] [address] [place], [place] [address]    Report Status 11/07/2021 FINAL  Final  Resp Panel by RT-PCR (Flu A&B, Covid) Nasopharyngeal Swab     Status: None   Collection Time: 11/02/21  5:25 PM   Specimen: Nasopharyngeal Swab; Nasopharyngeal(NP) swabs in vial transport medium  Result Value Ref Range Status   SARS Coronavirus 2 by RT PCR NEGATIVE NEGATIVE Final    Comment: (NOTE) SARS-CoV-2 target nucleic acids are NOT DETECTED.  The SARS-CoV-2 RNA is generally detectable in upper respiratory specimens during the acute phase of infection. The lowest concentration of SARS-CoV-2 viral copies this assay can detect is 138 copies/mL. A negative result does not preclude SARS-Cov-2 infection and should not be used as the sole basis for treatment or other  patient management decisions. A negative result may occur with  improper specimen collection/handling, submission of specimen other than nasopharyngeal swab, presence of viral mutation(s) within the areas targeted by this assay, and inadequate number of viral copies(<138 copies/mL). A negative result must be combined with clinical observations, patient history, and epidemiological information. The expected result is Negative.  Fact Sheet for Patients:  EntrepreneurPulse.com.au  Fact Sheet for Healthcare Providers:  IncredibleEmployment.be  This test is  no t yet approved or cleared by the [place] and  has been authorized for detection and/or diagnosis of SARS-CoV-2 by FDA under an Emergency Use Authorization (EUA). This EUA will remain  in effect (meaning this test can be used) for the duration of the COVID-19 declaration under Section 564(b)(1) of the Act, 21 U.S.C.section 360bbb-3(b)(1), unless the authorization is terminated  or revoked sooner.       Influenza A by PCR NEGATIVE NEGATIVE Final   Influenza B by PCR NEGATIVE NEGATIVE Final    Comment: (NOTE) The Xpert Xpress SARS-CoV-2/FLU/RSV plus assay is intended as an aid in the diagnosis of influenza from Nasopharyngeal swab specimens and should not be used as a sole basis for treatment. Nasal washings and aspirates are unacceptable for Xpert Xpress SARS-CoV-2/FLU/RSV testing.  Fact Sheet for Patients: EntrepreneurPulse.com.au  Fact Sheet for Healthcare Providers: IncredibleEmployment.be  This test is not yet approved or cleared by the [place] FDA and has been authorized for detection and/or diagnosis of SARS-CoV-2 by FDA under an Emergency Use Authorization (EUA). This EUA will remain in effect (meaning this test can be used) for the duration of the COVID-19 declaration under Section 564(b)(1) of the Act, 21 U.S.C. section  360bbb-3(b)(1), unless the authorization is terminated or revoked.  Performed at [facility], [place] [address], [place], [place] [address]   Urine Culture     Status: None   Collection Time: 11/02/21  6:26 PM   Specimen: In/Out Cath Urine  Result Value Ref Range Status   Specimen Description   Final    IN/OUT CATH URINE Performed at [facility] [address], [place], [place] [address]    Special Requests   Final    NONE Performed at [facility], [place] [address] [place], [place] [address]    Culture   Final    NO GROWTH Performed at [facility], [place] [address], [place], [place] [address]    Report Status 11/04/2021 FINAL  Final  MRSA Next Gen by PCR, Nasal     Status: None   Collection Time: 11/03/21 12:50 AM   Specimen: Nasal Mucosa; Nasal Swab  Result Value Ref Range Status   MRSA by PCR Next Gen NOT DETECTED NOT DETECTED Final    Comment: (NOTE) The GeneXpert MRSA Assay (FDA approved for NASAL specimens only), is one component of a comprehensive MRSA colonization surveillance program. It is not intended to diagnose MRSA infection nor to guide or monitor treatment for MRSA infections. Test performance is not FDA approved in patients less than 80 years old. Performed at [facility], [place] [address], [place], [place] [address]   Culture, Respiratory w Gram Stain     Status: None   Collection Time: 11/04/21  8:20 AM   Specimen: Bronchoalveolar Lavage; Respiratory  Result Value Ref Range Status   Specimen Description   Final    BRONCHIAL ALVEOLAR LAVAGE Performed at [facility] [address], [place], [place] [address]    Special Requests   Final    Immunocompromised Performed at [facility], [place] [address], [place], [place] [address]    Gram Stain   Final    WBC PRESENT, PREDOMINANTLY MONONUCLEAR SQUAMOUS EPITHELIAL CELLS PRESENT NO  ORGANISMS SEEN CYTOSPIN SMEAR    Culture   Final    NO GROWTH 2 DAYS Performed at [facility], [place] [address], [place], [place] [address]  Report Status 11/06/2021 FINAL  Final  Acid Fast Smear (AFB)     Status: None   Collection Time: 11/04/21  8:20 AM   Specimen: Bronchial Alveolar Lavage  Result Value Ref Range Status   AFB Specimen Processing Concentration  Final   Acid Fast Smear Negative  Corrected    Comment: (NOTE) Performed At: [facility] [address] [place], [place] [id number] Rush Farmer MD NO:[id number] CORRECTED ON 05/26 AT 6945: PREVIOUSLY REPORTED AS Comment    Source (AFB) BRONCHIAL ALVEOLAR LAVAGE  Final    Comment: Performed at [facility], [place] [address] [place], [place] [address]  Pneumocystis smear by DFA     Status: None   Collection Time: 11/04/21  8:20 AM   Specimen: Bronchial Alveolar Lavage; Respiratory  Result Value Ref Range Status   Specimen [facility] BRONCHIAL ALVEOLAR LAVAGE  Corrected    Comment: CORRECTED ON 05/20 AT 0948: PREVIOUSLY REPORTED AS SPUTUM RESPIRATORY CULTURE   Pneumocystis jiroveci Ag POSITIVE  Final    Comment: Performed at [facility], READ BACK BY AND VERIFIED WITH: DEVIN, RN AT 2156 ON 11/04/2021 BY Crist Fat, MT Performed at [facility], [place] [address], [place], [place] [address]           Radiology Studies: DG CHEST PORT 1 VIEW  Result Date: 11/10/2021 CLINICAL DATA:  Pneumonia.  HIV. EXAM: PORTABLE CHEST 1 VIEW COMPARISON:  One-view chest x-ray 11/02/2021 FINDINGS: Heart size is normal. Upper lobe airspace opacities are present scratched at subtle upper lobe airspace disease are present bilaterally. Lung bases are clear. IMPRESSION: Subtle bilateral upper lobe airspace disease compatible with pneumonia. This may represent atypical infection. Edema is considered less likely. Electronically Signed   By: San Morelle M.D.   On: 11/10/2021 09:18        Scheduled Meds:  bictegravir-emtricitabine-tenofovir AF  1 tablet Oral Daily   carbamide peroxide  5 drop Both EARS BID   enoxaparin (LOVENOX) injection  40 mg Subcutaneous Q24H   feeding supplement  237 mL Oral TID BM   ferrous sulfate  325 mg Oral Q breakfast   fluconazole  200 mg Oral Daily   folic acid  1 mg Oral Daily   LORazepam  1 mg Intravenous TID   mouth rinse  15 mL Mouth Rinse BID   metoprolol tartrate  12.5 mg Oral BID   multivitamin with minerals  1 tablet Oral Daily   OLANZapine  5 mg Oral QHS   [START ON 11/27/2021] sulfamethoxazole-trimethoprim  1 tablet Oral Once per day on Mon Wed Fri   sulfamethoxazole-trimethoprim  2 tablet Oral Q8H   thiamine  100 mg Oral Daily   traZODone  50 mg Oral QHS   Continuous Infusions:     LOS: 9 days     Bonnielee Haff, MD [facility]   If 7PM-7AM, please contact night-coverage [email or web address]  11/11/2021, 9:03 AM

## 2021-11-12 MED ORDER — FLUCONAZOLE 200 MG PO TABS
200.0000 mg | ORAL_TABLET | Freq: Every day | ORAL | 0 refills | Status: AC
Start: 2021-11-12 — End: 2021-11-24

## 2021-11-12 MED ORDER — FERROUS SULFATE 325 (65 FE) MG PO TABS: 325.0000 mg | ORAL_TABLET | Freq: Every day | ORAL | 2 refills | Status: DC

## 2021-11-12 MED ORDER — FOLIC ACID 1 MG PO TABS: 1.0000 mg | ORAL_TABLET | Freq: Every day | ORAL | 2 refills | Status: DC

## 2021-11-12 MED ORDER — SULFAMETHOXAZOLE-TRIMETHOPRIM 800-160 MG PO TABS: 2.0000 | ORAL_TABLET | Freq: Three times a day (TID) | ORAL | 0 refills | Status: AC

## 2021-11-12 MED ORDER — SULFAMETHOXAZOLE-TRIMETHOPRIM 800-160 MG PO TABS: 1.0000 | ORAL_TABLET | ORAL | 2 refills | Status: DC

## 2021-11-12 MED ORDER — THIAMINE HCL 100 MG PO TABS: 100.0000 mg | ORAL_TABLET | Freq: Every day | ORAL | 1 refills | Status: DC

## 2021-11-12 MED ORDER — ADULT MULTIVITAMIN W/MINERALS CH: 1.0000 | ORAL_TABLET | Freq: Every day | ORAL | Status: DC

## 2021-11-12 MED ORDER — BICTEGRAVIR-EMTRICITAB-TENOFOV 50-200-25 MG PO TABS
1.0000 | ORAL_TABLET | Freq: Every day | ORAL | 2 refills | Status: DC
Start: 2021-11-12 — End: 2021-11-14

## 2021-11-12 NOTE — Progress Notes (Signed)
PROGRESS NOTE    Jason CATTERTON  Herman:[id number] DOB: 04/19/1986 DOA: 11/02/2021 PCP: Patient, No Pcp Per (Inactive)   Brief Narrative: 36 year old with past medical history significant for HIV/AIDS with nonadherence with medication, paranoia, oral candidiasis who presented with altered mental status.  Patient was found to be obtunded and hypoxic on admission.  Subsequently he got agitated in the ED and received Geodon.  He was found to have 4 yellow pills  with him which were later identified as clonidine.  He was found to be febrile, hypotensive and hypoxic.  He was recently hospitalized from 5/8 until 5/11 for community-acquired pneumonia. Chest x-ray; mild diffuse interstitial and ill-defined opacities, potentially related to findings seen on recent CT chest.  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  Patient is started on treatment for PCP pneumonia.  He was also evaluated by psychiatry due to paranoid behavior and psychosis.  He was a started on risperidone and trazodone.  Psych was recommending on last evaluation inpatient psychiatric facility admission.     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 Complicated by underline psychiatric disorder.  -CT head: No acute intracranial abnormality.  Diffuse parenchymal atrophy, advanced in relation of patient's stated age. -He received Narcan on admission without significant improvement on MS.   -Patient with paranoid thought. Psych was consulted.  Medications were adjusted.  Mentation appears to have improved. Had 1 episode of emesis on 5/27.  Abdomen is benign.  Continue to monitor for now.  Acute Hypoxic Respiratory failure on admission, PNA PCP  -He presented with oxygen saturation down to 70%.  He was placed on oxygen. -Weaned off of oxygen.  Saturating in the mid to late 90s on room air. -Received  IV ampicillin.  He was changed to cefdinir.  He has completed the course of same.  -Underwent Bronchoscopy 5/20, follow culture: no growth to date.  Pneumocystis  smear antigen positive, fungus, acid fast culture, pneumocystis PCR pending.  -Continue with  treatment for PCP pneumonia with Bactrim.  ID recommends 3-week course of Bactrim (until 11/24/2021) followed by Bactrim DS 3 times a week (starting 11/27/2021) until CD4 count is greater than 200.  Paranoid, Confusion:  Per Psych Differential : Unspecified Psychotic Disorder Vs Schizophrenia Vs Delirium Vs Brief psychotic disorder.  Psychiatry was consulted.  Patient was initially started on respite all.  He experienced some extrapyramidal symptoms.  Started on benztropine and Risperdal was switched over to Zyprexa.  Has not required Haldol sitter was discontinued.  Telemetry discontinued.  Psychiatry feels that the patient has improved.  They do not think that he will require inpatient treatment.  The want to reevaluate him on Monday.  Continue current medications for now.  Remains just on Zyprexa and lorazepam.  Trazodone and benztropine will be discontinued.   Hypotension: Suspect related to clonidine overdose -He was found to have clonidine pills on him with unknown amount of ingestion. -Hypotensive initially and received 4 L of IV fluids. -Blood pressure improved.  Sinus tachycardia Likely multifactorial.  Patient was suspected of  clonidine OD at admission.  Patient was started on metoprolol due to concern for rebound  tachycardia. Upon further chart review it appears that he is not on clonidine on a regular basis.  Heart rate remained persistently elevated.  No significant agitation was noted. Reason for tachycardia remains unclear. He is asymptomatic for the most part and has been ambulating with sitter.  EKG confirms sinus tachycardia.  TSH was normal when checked recently.  Chest x-ray was done and showed normal heart size. Recent med changes reviewed. Was started on Risperdal recently. Now stopped. Now on Zyprexa and Cogentin. Both can cause tachycardia.  These medications were held briefly.  Cogentin has been stopped.  Zyprexa has been continued.   Heart rate has improved and seems to be close to baseline.  Telemetry has been discontinued.    Oral candidiasis Continue with fluconazole.  He is on a 3-week course, last dose will be on 11/24/2021.  HIV/AIDS CD4 count 5/80/2023 was 36. Not adherent to medication Started back on [facility] 5/23.   Normocytic Anemia Anemia panel revealed Iron deficiency.  Started on iron supplements.  Folic acid deficiency Started folic acid.   Metabolic acidosis, lactic acidosis Could be related to infection, and hypoperfusion, Hypoxemia.  Resolved with IV fluids.   Mild hyponatremia Stable  Cerumen Wax noted in both ears.  Debrox eardrops  DVT prophylaxis: Lovenox Code Status: Full code Family Communication: Discussed with patient.  No family at bedside. Disposition Plan: Psychiatry now feels that patient may not require inpatient psych treatment.  They want to reevaluate on 5/29.  Patient mentions that he lives with his aunt.  We will reach out to his family.  We will also need to arrange for his antibiotics and antiretroviral treatment prior to discharge.  Status is: Inpatient Remains inpatient appropriate because: management of infection, PNA    Consultants:  CCM ID  Procedures:  None    Subjective: Patient lying on the bed.  Denies any  complaints.  Looking forward to going home.  No complaints offered.  Did have an episode of vomiting yesterday but none since then.  Denies any abdominal pain.    Objective: Vitals:   11/11/21 1313 11/11/21 2044 11/11/21 2152 11/12/21 0541  BP: 116/82 106/68 114/72 101/68  Pulse: (!) 113 (!) 107 (!) 105 (!) 105  Resp: _0 Temp: (!) 97.5 F (36.4 C) 98.3 F (36.8 C)  98.5 F (36.9 C)  TempSrc: Oral Oral  Oral  SpO2: 97% 100%  100%  Weight:      Height:       No intake or output data in the 24 hours ending 11/12/21 0850  Filed Weights   11/02/21 1719 11/03/21 0139  Weight: 54 kg 54.5 kg    Examination:  General appearance: Awake alert.  In no distress Resp: Clear to auscultation bilaterally.  Normal effort Cardio: S1-S2 is normal regular.  No S3-S4.  No rubs murmurs or bruit GI: Abdomen is soft.  Nontender nondistended.  Bowel sounds are present normal.  No masses organomegaly Extremities: No edema.  Full range of motion of lower extremities. Neurologic: No focal neurological deficits.      Data Reviewed: I have personally reviewed following labs and imaging studies  CBC: Recent Labs  Lab 11/06/21 0612 11/07/21 1112 11/08/21 0513 11/10/21 0704  WBC 3.0* 3.7* 2.7* 3.5*  HGB 7.9* 8.6* 8.1* 9.2*  HCT 25.3* 26.5* 25.3* 28.5*  MCV 100.4* 99.3 99.6 99.3  PLT 208 240 224 272  Basic Metabolic Panel: Recent Labs  Lab 11/06/21 0612 11/08/21 0513 11/08/21 1424 11/10/21 0704  NA 136 135  --  133*  K 4.5 4.3  --  4.1  CL 107 108  --  102  CO2 21* 21*  --  22  GLUCOSE 80 88  --  149*  BUN 8 10  --  12  CREATININE 0.89 0.73  --  1.01  CALCIUM 8.6* 8.7*  --  9.2  MG  --   --   --  2.1  PHOS  --   --  3.8  --     GFR: Estimated Creatinine Clearance: 77.9 mL/min (by C-G formula based on SCr of 1.01 mg/dL).  CBG: No results for input(s): GLUCAP in the last 168 hours.  Recent Results (from the past 240 hour(s))  Blood Culture (routine x 2)     Status:  None   Collection Time: 11/02/21  4:55 PM   Specimen: BLOOD  Result Value Ref Range Status   Specimen Description   Final    BLOOD BLOOD LEFT FOREARM Performed at [facility] [place] [address], [place], [place] [address]    Special Requests   Final    BOTTLES DRAWN AEROBIC ONLY Blood Culture results may not be optimal due to an inadequate volume of blood received in culture bottles Performed at [facility] [address] [place], [place] [address]    Culture   Final    NO GROWTH 5 DAYS Performed at [facility], [place] [address] [place], [place] [address]    Report Status 11/07/2021 FINAL  Final  Blood Culture (routine x 2)     Status: None   Collection Time: 11/02/21  5:05 PM   Specimen: BLOOD  Result Value Ref Range Status   Specimen Description   Final    BLOOD RIGHT ANTECUBITAL Performed at [facility] [address] [place], [place] [address]    Special Requests   Final    BOTTLES DRAWN AEROBIC AND ANAEROBIC Blood Culture results may not be optimal due to an excessive volume of blood received in culture bottles Performed at [place] [address] [place], [place] [address]    Culture   Final    NO GROWTH 5 DAYS Performed at [facility], [place] [address], [place], [place] [address]    Report Status 11/07/2021 FINAL  Final  Resp Panel by RT-PCR (Flu A&B, Covid) Nasopharyngeal Swab     Status: None   Collection Time: 11/02/21  5:25 PM   Specimen: Nasopharyngeal Swab; Nasopharyngeal(NP) swabs in vial transport medium  Result Value Ref Range Status   SARS Coronavirus 2 by RT PCR NEGATIVE NEGATIVE Final    Comment: (NOTE) SARS-CoV-2 target nucleic acids are NOT DETECTED.  The SARS-CoV-2 RNA is generally detectable in upper respiratory specimens during the acute phase of infection. The lowest concentration of SARS-CoV-2 viral copies this assay can detect is 138 copies/mL. A  negative result does not preclude SARS-Cov-2 infection and should not be used as the sole basis for treatment or other patient management decisions. A negative result may occur with  improper specimen collection/handling, submission of specimen other than nasopharyngeal swab, presence of viral mutation(s) within the areas targeted by this assay, and inadequate number of viral copies(<138 copies/mL). A negative result must be combined with clinical observations, patient history, and epidemiological information. The expected result is Negative.  Fact Sheet for Patients:  EntrepreneurPulse.com.au  Fact Sheet for Healthcare Providers:  IncredibleEmployment.be  This test is no t yet approved or cleared by the [place] FDA and  has been authorized for detection and/or diagnosis of SARS-CoV-2 by FDA under an Emergency Use Authorization (EUA). This EUA will remain  in effect (meaning this test can be used) for the duration of the COVID-19 declaration under Section 564(b)(1) of the Act, 21 U.S.C.section 360bbb-3(b)(1), unless the authorization is terminated  or revoked sooner.       Influenza A by PCR NEGATIVE NEGATIVE Final   Influenza B by PCR NEGATIVE NEGATIVE Final    Comment: (NOTE) The Xpert Xpress SARS-CoV-2/FLU/RSV plus assay is intended as an aid in the diagnosis of influenza from Nasopharyngeal swab specimens and should not be used as a sole basis for treatment. Nasal washings and aspirates are unacceptable for Xpert Xpress SARS-CoV-2/FLU/RSV testing.  Fact Sheet for Patients: EntrepreneurPulse.com.au  Fact Sheet for Healthcare Providers: IncredibleEmployment.be  This test is not yet approved or cleared by the [place] FDA and has been authorized for detection and/or diagnosis of SARS-CoV-2 by FDA under an Emergency Use Authorization (EUA). This EUA will remain in effect (meaning this test can  be used) for the duration of the COVID-19 declaration under Section 564(b)(1) of the Act, 21 U.S.C. section 360bbb-3(b)(1), unless the authorization is terminated or revoked.  Performed at [facility], [place] [address] [place], [place] [address]   Urine Culture     Status: None   Collection Time: 11/02/21  6:26 PM   Specimen: In/Out Cath Urine  Result Value Ref Range Status   Specimen Description   Final    IN/OUT CATH URINE Performed at [place] [address], [place], [place] [address]    Special Requests   Final    NONE Performed at [facility], [place] [address] [place], [place] [address]    Culture   Final    NO GROWTH Performed at [facility], [place] [address] [place], [place] [address]    Report Status 11/04/2021 FINAL  Final  MRSA Next Gen by PCR, Nasal     Status: None   Collection Time: 11/03/21 12:50 AM   Specimen: Nasal Mucosa; Nasal Swab  Result Value Ref Range Status   MRSA by PCR Next Gen NOT DETECTED NOT DETECTED Final    Comment: (NOTE) The GeneXpert MRSA Assay (FDA approved for NASAL specimens only), is one component of a comprehensive MRSA colonization surveillance program. It is not intended to diagnose MRSA infection nor to guide or monitor treatment for MRSA infections. Test performance is not FDA approved in patients less than 18 years old. Performed at [facility], [place] [address] [place], [place] [address]   Culture, Respiratory w Gram Stain     Status: None   Collection Time: 11/04/21  8:20 AM   Specimen: Bronchoalveolar Lavage; Respiratory  Result Value Ref Range Status   Specimen Description   Final    BRONCHIAL ALVEOLAR LAVAGE Performed at [place] [address], [place], [place] [address]    Special Requests   Final    Immunocompromised Performed at [facility], [place] [address], [place],  [address]     Gram Stain   Final    WBC PRESENT, PREDOMINANTLY MONONUCLEAR SQUAMOUS EPITHELIAL CELLS PRESENT NO ORGANISMS SEEN CYTOSPIN SMEAR    Culture   Final    NO GROWTH 2 DAYS Performed at [facility], [place] [address], [place], [place] [address]    Report Status 11/06/2021 FINAL  Final  Acid Fast Smear (AFB)     Status: None   Collection Time: 11/04/21  8:20 AM   Specimen: Bronchial Alveolar Lavage  Result Value Ref Range Status   AFB Specimen Processing Concentration  Final   Acid Fast Smear Negative  Corrected    Comment: (NOTE) Performed At: [facility] [address] [place], [place] [id number] Rush Farmer MD FQ:[id number] CORRECTED ON 05/26 AT 3358: PREVIOUSLY REPORTED AS Comment    Source (AFB) BRONCHIAL ALVEOLAR LAVAGE  Final    Comment: Performed at [facility], [place] [address], [place], [place] [address]  Pneumocystis smear by DFA     Status: None   Collection Time: 11/04/21  8:20 AM   Specimen: Bronchial Alveolar Lavage; Respiratory  Result Value Ref Range Status   Specimen [facility] BRONCHIAL ALVEOLAR LAVAGE  Corrected    Comment: CORRECTED ON 05/20 AT 0948: PREVIOUSLY REPORTED AS SPUTUM RESPIRATORY CULTURE   Pneumocystis jiroveci Ag POSITIVE  Final    Comment: Performed at [place], READ BACK BY AND VERIFIED WITH: DEVIN, RN AT 2156 ON 11/04/2021 BY Crist Fat, MT Performed at [facility], [place] [address] [place],  [address]           Radiology Studies: No results found.      Scheduled Meds:  bictegravir-emtricitabine-tenofovir AF  1 tablet Oral Daily   carbamide peroxide  5 drop Both EARS BID   enoxaparin (LOVENOX) injection  40 mg Subcutaneous Q24H   feeding supplement  237 mL Oral TID BM   ferrous sulfate  325 mg Oral Q breakfast   fluconazole  200 mg Oral Daily   folic acid  1 mg Oral Daily   LORazepam  1 mg Oral TID   mouth rinse  15 mL Mouth  Rinse BID   metoprolol tartrate  12.5 mg Oral BID   multivitamin with minerals  1 tablet Oral Daily   OLANZapine  5 mg Oral QHS   [START ON 11/27/2021] sulfamethoxazole-trimethoprim  1 tablet Oral Once per day on Mon Wed Fri   sulfamethoxazole-trimethoprim  2 tablet Oral Q8H   thiamine  100 mg Oral Daily   Continuous Infusions:     LOS: 10 days     Bonnielee Haff, MD [facility]   If 7PM-7AM, please contact night-coverage [email or web address]  11/12/2021, 8:50 AM

## 2021-11-13 MED ORDER — METOPROLOL TARTRATE 25 MG PO TABS: 12.5000 mg | ORAL_TABLET | Freq: Two times a day (BID) | ORAL | 2 refills | Status: DC

## 2021-11-13 MED ORDER — OLANZAPINE 5 MG PO TABS
5.0000 mg | ORAL_TABLET | Freq: Every day | ORAL | 0 refills | Status: DC
Start: 2021-11-13 — End: 2021-12-18

## 2021-11-13 NOTE — Progress Notes (Signed)
PROGRESS NOTE    RILEN SHUKLA  NUU:[id number] DOB: 07-29-1985 DOA: 11/02/2021 PCP: Patient, No Pcp Per (Inactive)   Brief Narrative: 36 year old with past medical history significant for HIV/AIDS with nonadherence with medication, paranoia, oral candidiasis who presented with altered mental status.  Patient was found to be obtunded and hypoxic on admission.  Subsequently he got agitated in the ED and received Geodon.  He was found to have 4 yellow pills  with him which were later identified as clonidine.  He was found to be febrile, hypotensive and hypoxic.  He was recently hospitalized from 5/8 until 5/11 for community-acquired pneumonia. Chest x-ray; mild diffuse interstitial and ill-defined opacities, potentially related to findings seen on recent CT chest.  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  Patient is started on treatment for PCP pneumonia.  He was also evaluated by psychiatry due to paranoid behavior and psychosis.  He was a started on risperidone and trazodone.  Psych was recommending on last evaluation inpatient psychiatric facility admission.     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 Complicated by underline psychiatric disorder.  -CT head: No acute intracranial abnormality.  Diffuse parenchymal atrophy, advanced in relation of patient's stated age. -He received Narcan on admission without significant improvement on MS.   -Patient with paranoid thought. Psych was consulted.  Medications were adjusted.  Mentation appears to have improved. Had 1 episode of emesis on 5/27.  No recurrence.  Abdomen remains benign.    Pneumonia due to pneumocystis jiroveci/Acute Hypoxic Respiratory failure -He presented with oxygen saturation down to 70%.  He was placed on oxygen. -Weaned off of oxygen.  Saturating in the mid to late 90s on room air. -Received  IV ampicillin.  He was changed to cefdinir.  He has completed the course of same.  -Underwent Bronchoscopy 5/20, follow culture: no growth to date.  Pneumocystis  smear antigen positive, fungus, acid fast culture, pneumocystis PCR pending.  -Continue with  treatment for PCP pneumonia with Bactrim.  ID recommends 3-week course of Bactrim (until 11/24/2021) followed by Bactrim DS 3 times a week (starting 11/27/2021) until CD4 count is greater than 200.  Paranoid, Confusion:  Per Psych Differential : Unspecified Psychotic Disorder Vs Schizophrenia Vs Delirium Vs Brief psychotic disorder.  Psychiatry was consulted.  Patient was initially started on Risperdal.  He experienced some extrapyramidal symptoms.  Started on benztropine. Risperdal was switched over to Zyprexa.  Has not required Haldol. Sitter was discontinued.  Telemetry discontinued.  Psychiatry feels that the patient has improved.  They do not think that he will require inpatient treatment.   Remains just on Zyprexa and lorazepam.  Trazodone and benztropine discontinued.  Psychiatry to reevaluate patient today and give final recommendations regarding medications.  Hypotension: Suspect related to clonidine overdose -He was found to have clonidine pills on him with unknown amount of ingestion. -Hypotensive initially and received 4 L of IV fluids. -Blood pressure improved.  Sinus tachycardia Likely multifactorial.  Patient was suspected of clonidine OD at admission.  Patient was started  on metoprolol due to concern for  rebound tachycardia. Upon further chart review it appears that he is not on clonidine on a regular basis.  Heart rate remained persistently elevated.  No significant agitation was noted. Reason for tachycardia remains unclear. He is asymptomatic for the most part and has been ambulating with sitter.  EKG confirms sinus tachycardia.  TSH was normal when checked recently.  Chest x-ray was done and showed normal heart size. Recent med changes reviewed. Was started on Risperdal recently. Now stopped. Now on Zyprexa and Cogentin. Both can cause tachycardia.  These medications were held briefly.  Cogentin has been stopped.  Zyprexa has been continued.   Heart rate has improved and seems to be close to baseline.  Telemetry has been discontinued.    Oral candidiasis Continue with fluconazole.  He is on a 3-week course, last dose will be on 11/24/2021.  HIV/AIDS CD4 count 5/80/2023 was 36. Not adherent to medication Started back on [employer] 5/23.   Normocytic Anemia Anemia panel revealed Iron deficiency.  Started on iron supplements.  Folic acid deficiency Started folic acid.   Metabolic acidosis, lactic acidosis Could be related to infection, and hypoperfusion, Hypoxemia.  Resolved with IV fluids.   Mild hyponatremia Stable  Cerumen Wax noted in both ears.  Debrox eardrops  DVT prophylaxis: Lovenox Code Status: Full code Family Communication: Discussed with patient.  Long discussion with patient's cousin, Eloy End, yesterday. He is the patient's primary caregiver. Disposition Plan: Hopefully home later today once evaluated by psychiatry.  Patient's cousin mentioned that he fills his medications at the [facility] pharmacy on [address].  Prescriptions sent to the pharmacy.    Status is: Inpatient Remains inpatient appropriate because: management of infection, PNA    Consultants:  CCM ID  Procedures:  None    Subjective: Sitting on the side of the bed eating breakfast.  Looking  forward to going home soon.  Denies any complaints.     Objective: Vitals:   11/12/21 2112 11/12/21 2326 11/13/21 0527 11/13/21 0917  BP: 108/75 103/68 114/75 115/66  Pulse: (!) 119 (!) 110 (!) 126 (!) 112  Resp: _0 Temp: 98.1 F (36.7 C) 98.4 F (36.9 C) 97.9 F (36.6 C) 98.1 F (36.7 C)  TempSrc:  Oral Oral Oral  SpO2: 97% 100% 100% 99%  Weight:      Height:        Intake/Output Summary (Last 24 hours) at 11/13/2021 1032 Last data filed at 11/13/2021 0850 Gross per 24 hour  Intake 480 ml  Output --  Net 480 ml    Filed Weights   11/02/21 1719 11/03/21 0139  Weight: 54 kg 54.5 kg    Examination:  General appearance: Awake alert.  In no distress Resp: Clear to auscultation bilaterally.  Normal effort Cardio: S1-S2 is regular.  No S3-S4. GI: Abdomen is soft.  Nontender nondistended.  Bowel sounds are present normal.  No masses organomegaly     Data Reviewed: I have personally reviewed following labs and imaging studies  CBC: Recent Labs  Lab 11/07/21 1112 11/08/21 0513 11/10/21 0704  WBC 3.7* 2.7* 3.5*  HGB 8.6* 8.1* 9.2*  HCT 26.5* 25.3* 28.5*  MCV 99.3 99.6 99.3  PLT 240 224 062    Basic Metabolic Panel: Recent Labs  Lab 11/08/21 0513 11/08/21 1424 11/10/21 0704  NA 135  --  133*  K 4.3  --  4.1  CL 108  --  102  CO2 21*  --  22  GLUCOSE 88  --  149*  BUN 10  --  12  CREATININE 0.73  --  1.01  CALCIUM 8.7*  --  9.2  MG  --   --  2.1  PHOS  --  3.8  --     GFR: Estimated Creatinine Clearance: 77.9 mL/min (by C-G formula based on SCr of 1.01 mg/dL).  CBG: No results for input(s): GLUCAP in the last 168 hours.  Recent Results (from the past 240 hour(s))  Culture, Respiratory w Gram Stain     Status: None   Collection Time: 11/04/21  8:20 AM   Specimen: Bronchoalveolar Lavage; Respiratory  Result Value Ref Range Status   Specimen Description   Final    BRONCHIAL ALVEOLAR LAVAGE Performed at [facility] [address], [place], [place] [address]    Special Requests   Final    Immunocompromised Performed at [facility], [place] [address], [place], [place] [address]    Gram Stain   Final    WBC PRESENT, PREDOMINANTLY MONONUCLEAR SQUAMOUS EPITHELIAL CELLS PRESENT NO ORGANISMS SEEN CYTOSPIN SMEAR    Culture   Final    NO GROWTH 2 DAYS Performed at [facility], [place] [address] [place], [place] [address]    Report Status 11/06/2021 FINAL  Final  Acid Fast Smear (AFB)     Status: None   Collection Time: 11/04/21  8:20 AM   Specimen: Bronchial Alveolar Lavage  Result Value Ref Range Status   AFB Specimen Processing Concentration  Final   Acid Fast Smear Negative  Corrected    Comment: (NOTE) Performed At: [facility] [address] [place], [place] [id number] Rush Farmer MD ZS:[id number] CORRECTED ON 05/26 AT 3220: PREVIOUSLY REPORTED AS Comment    Source (AFB) BRONCHIAL ALVEOLAR LAVAGE  Final    Comment: Performed at [facility], [place] [address], [place], [place] [address]  Pneumocystis smear by DFA     Status: None   Collection Time: 11/04/21  8:20 AM   Specimen: Bronchial Alveolar Lavage; Respiratory  Result Value Ref Range Status   Specimen [facility] BRONCHIAL ALVEOLAR LAVAGE  Corrected    Comment: CORRECTED ON 05/20 AT 0948: PREVIOUSLY REPORTED AS SPUTUM RESPIRATORY CULTURE   Pneumocystis jiroveci Ag POSITIVE  Final    Comment: Performed at [place], READ BACK BY AND VERIFIED WITH: DEVIN, RN AT 2156 ON 11/04/2021 BY Crist Fat, MT Performed at [facility], [place] [address], [place], [place] [address]           Radiology Studies: No results found.      Scheduled Meds:  bictegravir-emtricitabine-tenofovir AF  1 tablet Oral Daily   enoxaparin (LOVENOX) injection  40 mg Subcutaneous Q24H   feeding supplement  237 mL Oral TID BM   ferrous sulfate  325  mg Oral Q breakfast   fluconazole  200 mg Oral Daily   folic acid  1 mg Oral Daily   LORazepam  1 mg Oral TID   mouth rinse  15 mL Mouth Rinse BID   metoprolol tartrate  12.5 mg Oral BID   multivitamin with minerals  1 tablet Oral Daily   OLANZapine  5 mg Oral QHS   [START ON 11/27/2021] sulfamethoxazole-trimethoprim  1 tablet Oral Once per day on Mon Wed Fri   sulfamethoxazole-trimethoprim  2 tablet Oral Q8H   thiamine  100 mg Oral Daily   Continuous Infusions:  LOS: 11 days     Bonnielee Haff, MD [facility]   If 7PM-7AM, please contact night-coverage [email or web address]  11/13/2021, 10:32 AM
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
## 2021-11-13 NOTE — Discharge Summary (Signed)
[facility]  Physician Discharge Summary   Patient ID: Jason Herman MRN: [id number] DOB/AGE: 1986/03/19 36 y.o.  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  Patient was found to be obtunded and hypoxic on admission.  Subsequently he got agitated in the ED and received Geodon.  He was found to have 4 yellow pills  with him which were later identified as clonidine.   He was found to be febrile, hypotensive and hypoxic.  He was recently hospitalized from 5/8 until 5/11 for community-acquired pneumonia. Chest x-ray; mild diffuse interstitial and ill-defined opacities, potentially related to findings seen on recent CT chest.   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  Patient is started on treatment for PCP pneumonia.  He was also evaluated by psychiatry due to paranoid behavior and psychosis.  He was a started on risperidone and trazodone.  Psych was recommending on last evaluation inpatient psychiatric facility admission.    Consultations: Infectious disease Psychiatry  Procedures: None    HOSPITAL COURSE:   Acute Metabolic Encephalopathy: -Encephalopathy likely multifactorial in the setting of clonidine overdose, sepsis from pneumonia, untreated underlying HIV. Complicated by underline psychiatric disorder.  -CT head: No acute intracranial abnormality.  Diffuse parenchymal atrophy, advanced in relation of patient's stated age. -He received Narcan on admission without significant improvement on MS.  -Patient with paranoid thought. Psych was consulted.  Medications were adjusted.  Mentation appears to have improved. Had 1 episode of emesis on 5/27.  No recurrence.  Abdomen remains benign.     Pneumonia due to pneumocystis jiroveci/Acute Hypoxic Respiratory failure -He presented with oxygen saturation down to 70%.  He was placed on oxygen. -Weaned off of oxygen.  Saturating in the mid to late 90s on room air. -Received  IV ampicillin.  He was changed to cefdinir.  He has completed the course of same.  -Underwent Bronchoscopy 5/20, follow culture: no growth to date.  Pneumocystis  smear antigen positive, fungus, acid fast culture, pneumocystis PCR pending.  -Continue with  treatment for PCP pneumonia with Bactrim.  ID recommends 3-week course of Bactrim (until 11/24/2021) followed by Bactrim DS 3 times a week (starting 11/27/2021) until CD4 count is greater than 200.   Paranoid, Confusion:  Per Psych Differential : Unspecified Psychotic Disorder Vs Schizophrenia Vs Delirium Vs Brief psychotic disorder.  Psychiatry was consulted.  Patient was initially started on Risperdal.  He experienced some extrapyramidal symptoms.  Started on benztropine. Risperdal was switched over to Zyprexa.  Has not required Haldol. Sitter was discontinued.  Telemetry discontinued.  Psychiatry feels that the patient has improved.  They do not think that he will require inpatient treatment.   Remains just on Zyprexa  and lorazepam.   Trazodone and benztropine discontinued.  Seen by psychiatry today.  Recommend discharging just on Zyprexa.   Hypotension: Suspect related to clonidine overdose -He was found to have clonidine pills on him with unknown amount of ingestion. -Hypotensive initially and received 4 L of IV fluids. -Blood pressure improved.   Sinus tachycardia Likely multifactorial.  Patient was suspected of clonidine OD at admission.  Patient was started on metoprolol due to concern for rebound tachycardia. Upon further chart review it appears that he is not on clonidine on a regular basis.  Heart rate remained persistently elevated.  No significant agitation was noted. Reason for tachycardia remains unclear. He is asymptomatic for the most part and has been ambulating with sitter.  EKG confirms sinus tachycardia.  TSH was normal when checked recently.  Chest x-ray was done and showed normal heart size. Recent med changes reviewed. Was started on Risperdal recently. Now stopped. Now on Zyprexa and Cogentin. Both can cause tachycardia.  These medications were held briefly.  Cogentin has been stopped.  Zyprexa has been continued.   Heart rate has improved and seems to be close to baseline.  Telemetry has been discontinued.     Oral candidiasis Continue with fluconazole.  He is on a 3-week course, last dose will be on 11/24/2021.   HIV/AIDS CD4 count 5/80/2023 was 36. Not adherent to medication Started back on [facility] 5/23.    Normocytic Anemia Anemia panel revealed Iron deficiency.  Started on iron supplements.   Folic acid deficiency Started folic acid.    Metabolic acidosis, lactic acidosis Could be related to infection, and hypoperfusion, Hypoxemia.  Resolved with IV fluids.    Mild hyponatremia Stable   Severe protein calorie malnutrition Nutrition Problem: Severe Malnutrition Etiology: chronic illness (HIV/AIDS, substance abuse)  Signs/Symptoms: percent weight loss, severe fat depletion, severe  muscle depletion  Interventions: Ensure Enlive (each supplement provides 350kcal and 20 grams of protein), MVI  Patient is stable.  Okay for discharge home today.   PERTINENT LABS:  The results of significant diagnostics from this hospitalization (including imaging, microbiology, ancillary and laboratory) are listed below for reference.    Labs:   Basic Metabolic Panel: Recent Labs  Lab 11/08/21 0513 11/08/21 1424 11/10/21 0704  NA 135  --  133*  K 4.3  --  4.1  CL 108  --  102  CO2 21*  --  22  GLUCOSE 88  --  149*  BUN 10  --  12  CREATININE 0.73  --  1.01  CALCIUM 8.7*  --  9.2  MG  --   --  2.1  PHOS  --  3.8  --     CBC: Recent Labs  Lab 11/07/21 1112 11/08/21 0513 11/10/21 0704  WBC 3.7* 2.7* 3.5*  HGB 8.6* 8.1* 9.2*  HCT 26.5* 25.3* 28.5*  MCV 99.3 99.6 99.3  PLT 240 224 272   Cardiac Enzymes: Recent Labs  Lab 11/10/21 0704  CKTOTAL 27*     IMAGING STUDIES DG Chest 1 View  Result Date: 10/23/2021 CLINICAL DATA:  Cough, fever EXAM: CHEST  1 VIEW COMPARISON:  10/21/2021 FINDINGS: The heart size and mediastinal contours are within normal limits. Increasing airspace opacity within the right upper lobe. Previously seen right lower lobe opacity has slightly improved. No pleural effusion or pneumothorax. The visualized skeletal structures are unremarkable. IMPRESSION: Worsening right upper lobe pneumonia. Previously seen right lower lobe opacity has slightly improved. Electronically Signed   By: Davina Poke  D.O.   On: 10/23/2021 17:35   DG Chest 2 View  Result Date: 10/21/2021 CLINICAL DATA:  HIV positive with decreased p.o. intake, losing weight and pain all over. EXAM: CHEST - 2 VIEW COMPARISON:  October 02, 2021 FINDINGS: The heart size and mediastinal contours are within normal limits. Very mild, ill-defined areas of atelectasis versus early infiltrate are seen along the periphery of the mid right lung and right lung base. There is no evidence of a  pleural effusion or pneumothorax. The visualized skeletal structures are unremarkable. IMPRESSION: Very mild areas of atelectasis versus early infiltrate along the periphery of the mid right lung and right lung base. CT correlation is recommended. Electronically Signed   By: Virgina Norfolk M.D.   On: 10/21/2021 17:42   CT Head Wo Contrast  Result Date: 11/02/2021 CLINICAL DATA:  Delirium EXAM: CT HEAD WITHOUT CONTRAST TECHNIQUE: Contiguous axial images were obtained from the base of the skull through the vertex without intravenous contrast. RADIATION DOSE REDUCTION: This exam was performed according to the departmental dose-optimization program which includes automated exposure control, adjustment of the mA and/or kV according to patient size and/or use of iterative reconstruction technique. COMPARISON:  10/21/2021 FINDINGS: Brain: There is mild diffuse parenchymal volume loss which is advanced in relation to the patient's stated age. No abnormal intra or extra-axial mass lesion or fluid collection. No abnormal mass effect or midline shift. No evidence of acute intracranial hemorrhage or infarct. Ventricular size is normal. Cerebellum unremarkable. Vascular: Unremarkable Skull: Intact Sinuses/Orbits: Mild diffuse mucosal thickening within the visualized paranasal sinuses with small air-fluid level partially visualized within the right maxillary sinus. Orbits are unremarkable. Other: Mastoid air cells and middle ear cavities are clear. IMPRESSION: No acute intracranial abnormality. Diffuse parenchymal atrophy, advanced in relation of the patient's stated age. Mild diffuse paranasal sinus disease Electronically Signed   By: Fidela Salisbury M.D.   On: 11/02/2021 18:45   CT Head W or Wo Contrast  Result Date: 10/21/2021 CLINICAL DATA:  Altered mental status EXAM: CT HEAD WITHOUT AND WITH CONTRAST TECHNIQUE: Contiguous axial images were obtained from the base of the skull through the vertex without and with  intravenous contrast. RADIATION DOSE REDUCTION: This exam was performed according to the departmental dose-optimization program which includes automated exposure control, adjustment of the mA and/or kV according to patient size and/or use of iterative reconstruction technique. CONTRAST:  40m OMNIPAQUE IOHEXOL 300 MG/ML  SOLN COMPARISON:  None Available. FINDINGS: Brain: There is no mass, hemorrhage or extra-axial collection. The size and configuration of the ventricles and extra-axial CSF spaces are normal. The brain parenchyma is normal, without acute or chronic infarction. Vascular: No abnormal hyperdensity of the major intracranial arteries or dural venous sinuses. No intracranial atherosclerosis. Skull: The visualized skull base, calvarium and extracranial soft tissues are normal. Sinuses/Orbits: No fluid levels or advanced mucosal thickening of the visualized paranasal sinuses. No mastoid or middle ear effusion. The orbits are normal. IMPRESSION: Normal head CT. Electronically Signed   By: KUlyses JarredM.D.   On: 10/21/2021 19:04   CT CHEST W CONTRAST  Result Date: 10/23/2021 CLINICAL DATA:  Pneumonia, complication suspected, xray done.  HIV EXAM: CT CHEST WITH CONTRAST TECHNIQUE: Multidetector CT imaging of the chest was performed during intravenous contrast administration. RADIATION DOSE REDUCTION: This exam was performed according to the departmental dose-optimization program which includes automated exposure control, adjustment of the mA and/or kV according to patient size and/or use of iterative reconstruction technique. CONTRAST:  695mOMNIPAQUE IOHEXOL  350 MG/ML SOLN COMPARISON:  Chest x-ray today FINDINGS: Cardiovascular: Heart is normal size. Aorta is normal caliber. Mediastinum/Nodes: No mediastinal, hilar, or axillary adenopathy. Lungs/Pleura: Extensive ground-glass airspace opacities are noted. There are cystic/cavitary areas most pronounced in the lower lobes and right upper lobe, but seen  throughout both lungs Upper Abdomen: No acute findings Musculoskeletal: Chest wall soft tissues are unremarkable. No acute bony abnormality. IMPRESSION: Extensive ground-glass airspace opacities and cystic/cavitary airspace disease throughout both lungs, most pronounced in the right upper lobe and both lower lobes. In the setting of HIV, this likely represents PCP pneumonia or other atypical pneumonia. Electronically Signed   By: Rolm Baptise M.D.   On: 10/23/2021 22:28   DG CHEST PORT 1 VIEW  Result Date: 11/10/2021 CLINICAL DATA:  Pneumonia.  HIV. EXAM: PORTABLE CHEST 1 VIEW COMPARISON:  One-view chest x-ray 11/02/2021 FINDINGS: Heart size is normal. Upper lobe airspace opacities are present scratched at subtle upper lobe airspace disease are present bilaterally. Lung bases are clear. IMPRESSION: Subtle bilateral upper lobe airspace disease compatible with pneumonia. This may represent atypical infection. Edema is considered less likely. Electronically Signed   By: San Morelle M.D.   On: 11/10/2021 09:18   DG Chest Port 1 View  Result Date: 11/02/2021 CLINICAL DATA:  Questionable sepsis - evaluate for abnormality EXAM: PORTABLE CHEST 1 VIEW COMPARISON:  CT chest and chest radiograph Oct 23, 2021. FINDINGS: Mild diffuse interstitial and ill-defined opacities. No confluent consolidation. No visible pleural effusions or pneumothorax. Cardiomediastinal silhouette is within normal limits. No displaced fracture. IMPRESSION: Mild diffuse interstitial and ill-defined opacities, potentially related to findings seen on recent CT chest. No confluent consolidation. Electronically Signed   By: Margaretha Sheffield M.D.   On: 11/02/2021 17:33   US Abdomen Limited RUQ (LIVER/GB)  Result Date: 10/23/2021 CLINICAL DATA:  Elevated liver enzymes EXAM: ULTRASOUND ABDOMEN LIMITED RIGHT UPPER QUADRANT COMPARISON:  None Available. FINDINGS: Gallbladder: No gallstones or wall thickening visualized. No sonographic Murphy  sign noted by sonographer. Common bile duct: Diameter: Normal caliber, 4 mm Liver: No focal lesion identified. Within normal limits in parenchymal echogenicity. Liver appears prominent. Portal vein is patent on color Doppler imaging with normal direction of blood flow towards the liver. Other: None. IMPRESSION: Liver appears prominent. Recommend clinical correlation to exclude hepatomegaly. Electronically Signed   By: Rolm Baptise M.D.   On: 10/23/2021 21:49    DISCHARGE EXAMINATION: Vitals:   11/12/21 2112 11/12/21 2326 11/13/21 0527 11/13/21 0917  BP: 108/75 103/68 114/75 115/66  Pulse: (!) 119 (!) 110 (!) 126 (!) 112  Resp: _0 Temp: 98.1 F (36.7 C) 98.4 F (36.9 C) 97.9 F (36.6 C) 98.1 F (36.7 C)  TempSrc:  Oral Oral Oral  SpO2: 97% 100% 100% 99%  Weight:      Height:       General appearance: Awake alert.  In no distress Resp: Clear to auscultation bilaterally.  Normal effort Cardio: S1-S2 is normal regular.  No S3-S4.  No rubs murmurs or bruit GI: Abdomen is soft.  Nontender nondistended.  Bowel sounds are present normal.  No masses organomegaly   DISPOSITION: Home with cousin  Discharge Instructions     Call MD for:  difficulty breathing, headache or visual disturbances   Complete by: As directed    Call MD for:  extreme fatigue   Complete by: As directed    Call MD for:  persistant dizziness or light-headedness   Complete by: As directed  Call MD for:  persistant nausea and vomiting   Complete by: As directed    Call MD for:  severe uncontrolled pain   Complete by: As directed    Call MD for:  temperature >100.4   Complete by: As directed    Diet general   Complete by: As directed    Discharge instructions   Complete by: As directed    Please take your medications as prescribed.  Please be sure to keep your follow-up appointments.  You were cared for by a hospitalist during your hospital stay. If you have any questions about your discharge medications  or the care you received while you were in the hospital after you are discharged, you can call the unit and asked to speak with the hospitalist on call if the hospitalist that took care of you is not available. Once you are discharged, your primary care physician will handle any further medical issues. Please note that NO REFILLS for any discharge medications will be authorized once you are discharged, as it is imperative that you return to your primary care physician (or establish a relationship with a primary care physician if you do not have one) for your aftercare needs so that they can reassess your need for medications and monitor your lab values. If you do not have a primary care physician, you can call [phone] for a physician referral.   Increase activity slowly   Complete by: As directed          Allergies as of 11/13/2021   No Known Allergies      Medication List     STOP taking these medications    cloNIDine 0.1 MG tablet Commonly known as: CATAPRES       TAKE these medications    bictegravir-emtricitabine-tenofovir AF 50-200-25 MG Tabs tablet Commonly known as: BIKTARVY Take 1 tablet by mouth daily.   ferrous sulfate 325 (65 FE) MG tablet Take 1 tablet (325 mg total) by mouth daily with breakfast.   fluconazole 200 MG tablet Commonly known as: DIFLUCAN Take 1 tablet (200 mg total) by mouth daily for 12 days.   folic acid 1 MG tablet Commonly known as: FOLVITE Take 1 tablet (1 mg total) by mouth daily.   metoprolol tartrate 25 MG tablet Commonly known as: LOPRESSOR Take 0.5 tablets (12.5 mg total) by mouth 2 (two) times daily.   multivitamin with minerals Tabs tablet Take 1 tablet by mouth daily.   OLANZapine 5 MG tablet Commonly known as: ZYPREXA Take 1 tablet (5 mg total) by mouth at bedtime.   sulfamethoxazole-trimethoprim 800-160 MG tablet Commonly known as: BACTRIM DS Take 2 tablets by mouth every 8 (eight) hours for 12 days.    sulfamethoxazole-trimethoprim 800-160 MG tablet Commonly known as: BACTRIM DS Take 1 tablet by mouth 3 (three) times a week. Start taking on: November 27, 2021   thiamine 100 MG tablet Take 1 tablet (100 mg total) by mouth daily.          [place]. Call in 1 day(s).   Specialty: Behavioral Health Why: Make an appointment for medication management. You will need to be monitored and weaned off your psych meds safely. Contact information: [place] [address] [place], [place], DO Follow up on 11/28/2021.   Specialties: Infectious Diseases, Internal Medicine Why: on 11/28/2021 at 10:30AM. Contact information: [place]  [place] [address] [phone]                 TOTAL DISCHARGE TIME: 87 minutes  [place]  [facility] Pager on [email or web address]  11/14/2021, 10:34 AM

## 2021-11-14 ENCOUNTER — Other Ambulatory Visit: Payer: Self-pay

## 2021-11-14 MED ORDER — BICTEGRAVIR-EMTRICITAB-TENOFOV 50-200-25 MG PO TABS
1.0000 | ORAL_TABLET | Freq: Every day | ORAL | 2 refills | Status: DC
Start: 2021-11-14 — End: 2021-12-27

## 2021-11-17 ENCOUNTER — Telehealth (HOSPITAL_COMMUNITY): Payer: Self-pay | Admitting: Emergency Medicine

## 2021-11-17 NOTE — BH Assessment (Signed)
Care Management - [facility] Follow Up Discharges   Writer attempted to make contact with patient today and was unsuccessful.  The phone number listed in epic is not a working phone number.   Per chart review, patient was provided with outpatient resources.

## 2021-11-28 ENCOUNTER — Telehealth: Payer: Self-pay

## 2021-11-28 ENCOUNTER — Inpatient Hospital Stay: Payer: Self-pay | Admitting: Internal Medicine

## 2021-11-28 NOTE — Telephone Encounter (Signed)
Called patient to reschedule missed appointment. No answer at either mobile or home phone numbers. Unable to leave message; mobile rang but never went to voicemail. Busy signal on home line. Will send MyChart message.  Binnie Kand, RN
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
## 2021-12-07 LAB — FUNGUS CULTURE WITH STAIN

## 2021-12-07 LAB — FUNGUS CULTURE RESULT

## 2021-12-07 LAB — FUNGAL ORGANISM REFLEX

## 2021-12-18 ENCOUNTER — Ambulatory Visit (INDEPENDENT_AMBULATORY_CARE_PROVIDER_SITE_OTHER): Payer: No Payment, Other | Admitting: Psychiatry

## 2021-12-18 ENCOUNTER — Encounter (HOSPITAL_COMMUNITY): Payer: Self-pay | Admitting: Psychiatry

## 2021-12-18 DIAGNOSIS — F1290 Cannabis use, unspecified, uncomplicated: Secondary | ICD-10-CM | POA: Diagnosis not present

## 2021-12-18 DIAGNOSIS — F23 Brief psychotic disorder: Secondary | ICD-10-CM | POA: Diagnosis not present

## 2021-12-18 MED ORDER — OLANZAPINE 5 MG PO TABS: 5.0000 mg | ORAL_TABLET | Freq: Every day | ORAL | 3 refills | Status: DC
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
## 2021-12-20 LAB — ACID FAST CULTURE WITH REFLEXED SENSITIVITIES (MYCOBACTERIA): Acid Fast Culture: NEGATIVE

## 2021-12-27 ENCOUNTER — Encounter: Payer: Self-pay | Admitting: Internal Medicine

## 2021-12-27 ENCOUNTER — Ambulatory Visit (INDEPENDENT_AMBULATORY_CARE_PROVIDER_SITE_OTHER): Payer: Self-pay | Admitting: Internal Medicine

## 2021-12-27 ENCOUNTER — Other Ambulatory Visit: Payer: Self-pay

## 2021-12-27 VITALS — BP 109/76 | HR 92 | Temp 97.8°F | Ht 71.0 in | Wt 172.0 lb

## 2021-12-27 DIAGNOSIS — B59 Pneumocystosis: Secondary | ICD-10-CM

## 2021-12-27 DIAGNOSIS — B029 Zoster without complications: Secondary | ICD-10-CM

## 2021-12-27 DIAGNOSIS — F23 Brief psychotic disorder: Secondary | ICD-10-CM

## 2021-12-27 DIAGNOSIS — B20 Human immunodeficiency virus [HIV] disease: Secondary | ICD-10-CM

## 2021-12-27 DIAGNOSIS — B370 Candidal stomatitis: Secondary | ICD-10-CM

## 2021-12-27 MED ORDER — NAPROXEN 500 MG PO TABS: 500.0000 mg | ORAL_TABLET | Freq: Two times a day (BID) | ORAL | 0 refills | Status: DC

## 2021-12-27 MED ORDER — BICTEGRAVIR-EMTRICITAB-TENOFOV 50-200-25 MG PO TABS: 1.0000 | ORAL_TABLET | Freq: Every day | ORAL | 2 refills | Status: DC

## 2021-12-27 MED ORDER — SULFAMETHOXAZOLE-TRIMETHOPRIM 800-160 MG PO TABS: 1.0000 | ORAL_TABLET | ORAL | 2 refills | Status: DC

## 2021-12-27 MED ORDER — VALACYCLOVIR HCL 1 G PO TABS: 1000.0000 mg | ORAL_TABLET | Freq: Three times a day (TID) | ORAL | 0 refills | Status: AC

## 2021-12-27 NOTE — Patient Instructions (Addendum)
Thank you for coming to see me today. It was a pleasure seeing you.  To Do: Continue Biktarvy every day Resume Bactrim 1 tablet every Monday, Wednesday, Friday Start taking Valtrex 1gm three times per day for 7 days I gave you a short course of pain medications Work on finding a primary care doctor  If you have any questions or concerns, please do not hesitate to call the office at [phone].  Take Care,   Gwynn Burly  [facility] Primary Care Providers [facility], [address], [address], [place], [place] [phone] [facility], [address], [place], [place], [phone] [facility], [address] [place], [place] [phone]  Primary Care (many locations): [email or web address]
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
## 2021-12-27 NOTE — Assessment & Plan Note (Signed)
Recently admitted and treated with Bactrim x 21 days after BAL PCP smear by DFA and PCR were positive.  Will continue with Bactrim 1DS tab TIW for now.  He reports not taking this for the past few weeks so refills were sent today and adherence was stressed.

## 2021-12-27 NOTE — Assessment & Plan Note (Signed)
Patient was admitted at [facility] in May 2023 for PCP pneumonia and resumed on Biktarvy.  He has missed a couple appointments since that time and is here today.  He reports adherence to [facility] since this past hospitalization.  Resistance testing done in May 2023 did not predict any associated resistance with his regimen but adherence will be challenging with his psychiatric comorbidities.  Will repeat viral load testing today along with genotype now that he has been back on Biktarvy for several weeks.  Refills sent today on his Biktarvy.

## 2021-12-27 NOTE — Assessment & Plan Note (Signed)
He appears to have shingles limited to a single dermatome and not crossing the midline.  Will obtain VZV PCR today.  Prescription for Valtrex 1gm TID x 7 days also sent along with Naproxen for pain relief.  Follow up in 1 week and return precautions discussed.

## 2021-12-27 NOTE — Assessment & Plan Note (Signed)
Seen by psychiatry during admission and had virtual follow up on 12/18/21.  He was continued on Zyprexa 5mg  QHS.

## 2021-12-28 ENCOUNTER — Encounter: Payer: Self-pay | Admitting: Internal Medicine

## 2021-12-28 LAB — T-HELPER CELL (CD4) - (RCID CLINIC ONLY)
CD4 % Helper T Cell: 31 % — ABNORMAL LOW (ref 33–65)
CD4 T Cell Abs: 367 /uL — ABNORMAL LOW (ref 400–1790)

## 2021-12-29 LAB — VARICELLA-ZOSTER BY PCR: VARICELLA ZOSTER VIRUS (VZV) DNA, QL RT PCR: DETECTED — CR

## 2022-01-06 LAB — COMPLETE METABOLIC PANEL WITH GFR
AG Ratio: 1.1 (calc) (ref 1.0–2.5)
ALT: 19 U/L (ref 9–46)
AST: 20 U/L (ref 10–40)
Albumin: 4.1 g/dL (ref 3.6–5.1)
Alkaline phosphatase (APISO): 70 U/L (ref 36–130)
BUN: 10 mg/dL (ref 7–25)
CO2: 23 mmol/L (ref 20–32)
Calcium: 10 mg/dL (ref 8.6–10.3)
Chloride: 107 mmol/L (ref 98–110)
Creat: 1.15 mg/dL (ref 0.60–1.26)
Globulin: 3.7 g/dL (calc) (ref 1.9–3.7)
Glucose, Bld: 87 mg/dL (ref 65–99)
Potassium: 4.3 mmol/L (ref 3.5–5.3)
Sodium: 137 mmol/L (ref 135–146)
Total Bilirubin: 0.3 mg/dL (ref 0.2–1.2)
Total Protein: 7.8 g/dL (ref 6.1–8.1)
eGFR: 84 mL/min/{1.73_m2} (ref >=60)

## 2022-01-06 LAB — HIV-1 GENOTYPING (RTI,PI,IN INHBTR)
Date Viral Load Collected: 7122023
HIV-1 Genotype: DETECTED — AB

## 2022-01-06 LAB — HIV-1 RNA QUANT-NO REFLEX-BLD
HIV 1 RNA Quant: 393 Copies/mL — ABNORMAL HIGH
HIV-1 RNA Quant, Log: 2.59 Log cps/mL — ABNORMAL HIGH

## 2022-01-09 ENCOUNTER — Other Ambulatory Visit: Payer: Self-pay | Admitting: Internal Medicine

## 2022-01-09 NOTE — Telephone Encounter (Signed)
Please advise on refill.

## 2022-02-02 ENCOUNTER — Telehealth: Payer: Self-pay

## 2022-02-02 NOTE — Telephone Encounter (Signed)
Patient with detectable viral load and needs appointment. Called to schedule, patient's mom answered and reports that JJ does not have a working phone and she manages all of his appointments. Scheduled for 8/24, did not discuss any private or health information with patient's mother.   Sandie Ano, RN

## 2022-02-08 ENCOUNTER — Ambulatory Visit: Payer: Self-pay | Admitting: Internal Medicine

## 2022-03-22 ENCOUNTER — Encounter (HOSPITAL_COMMUNITY): Payer: Self-pay

## 2022-03-22 ENCOUNTER — Telehealth (HOSPITAL_COMMUNITY): Payer: Self-pay | Admitting: Psychiatry

## 2022-03-22 ENCOUNTER — Encounter (HOSPITAL_COMMUNITY): Payer: Self-pay | Admitting: Psychiatry

## 2022-03-22 ENCOUNTER — Telehealth (HOSPITAL_COMMUNITY): Payer: No Payment, Other | Admitting: Psychiatry

## 2022-03-22 DIAGNOSIS — F23 Brief psychotic disorder: Secondary | ICD-10-CM

## 2022-03-22 MED ORDER — OLANZAPINE 5 MG PO TABS: 5.0000 mg | ORAL_TABLET | Freq: Every day | ORAL | 3 refills | Status: DC

## 2022-03-22 NOTE — Telephone Encounter (Signed)
Provider called patient's however he did not respond.  Patient's cousin Ms. Jason Herman notified informed writer that the patient is without a phone.  She gave patient several numbers to reach patient on but was unsuccessful.  Provider informed Ms. Mcgeehan that Mr. Jason Herman medications will be refilled and an appointment was scheduled for him on January 4 at 11:00.  She endorsed understanding and notes that she would inform him.  No other concerns at this time.

## 2022-04-04 ENCOUNTER — Encounter: Payer: Self-pay | Admitting: Internal Medicine

## 2022-04-04 ENCOUNTER — Other Ambulatory Visit: Payer: Self-pay

## 2022-04-04 ENCOUNTER — Ambulatory Visit (INDEPENDENT_AMBULATORY_CARE_PROVIDER_SITE_OTHER): Payer: Commercial Managed Care - HMO | Admitting: Internal Medicine

## 2022-04-04 VITALS — BP 112/79 | HR 85 | Temp 97.9°F | Wt 191.8 lb

## 2022-04-04 DIAGNOSIS — F23 Brief psychotic disorder: Secondary | ICD-10-CM | POA: Diagnosis not present

## 2022-04-04 DIAGNOSIS — B20 Human immunodeficiency virus [HIV] disease: Secondary | ICD-10-CM

## 2022-04-04 DIAGNOSIS — Z2989 Encounter for other specified prophylactic measures: Secondary | ICD-10-CM

## 2022-04-04 MED ORDER — BICTEGRAVIR-EMTRICITAB-TENOFOV 50-200-25 MG PO TABS: 1.0000 | ORAL_TABLET | Freq: Every day | ORAL | 5 refills | Status: DC

## 2022-04-04 MED ORDER — SULFAMETHOXAZOLE-TRIMETHOPRIM 800-160 MG PO TABS
1.0000 | ORAL_TABLET | ORAL | 5 refills | Status: DC
Start: 2022-04-04 — End: 2022-05-23

## 2022-04-04 NOTE — Assessment & Plan Note (Signed)
Patient followed by psychiatry and remains on Zyprexa 5mg  QHS.

## 2022-04-04 NOTE — Assessment & Plan Note (Signed)
Patient last seen in July 2023.  Viral load at that time was 393 copies and genotyping did not show resistance.  He reports adherence to [employer].  Will check labs again today.  Follow up in 3 months.

## 2022-04-04 NOTE — Patient Instructions (Signed)
Continue taking Biktarvy every day  Start taking Bactrim 1 DS tab Monday, Wednesday, and Friday  I sent refills to your pharmacy

## 2022-04-04 NOTE — Assessment & Plan Note (Signed)
Will continue with Bactrim 1DS tab TIW for now.  He reports not having this medication currently and refills were sent today.
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
## 2022-04-06 LAB — T-HELPER CELL (CD4) - (RCID CLINIC ONLY)
CD4 % Helper T Cell: 25 % — ABNORMAL LOW (ref 33–65)
CD4 T Cell Abs: 494 /uL (ref 400–1790)

## 2022-04-06 LAB — HIV-1 RNA QUANT-NO REFLEX-BLD
HIV 1 RNA Quant: 47 Copies/mL — ABNORMAL HIGH
HIV-1 RNA Quant, Log: 1.67 Log cps/mL — ABNORMAL HIGH

## 2022-04-13 ENCOUNTER — Telehealth (HOSPITAL_COMMUNITY): Payer: Self-pay | Admitting: *Deleted

## 2022-04-13 NOTE — Telephone Encounter (Signed)
VM left fri around noon to have someone call him back. Did not leave info as to what it was regarding. I called him and it went to VM and message left for him to return this call.

## 2022-04-14 ENCOUNTER — Other Ambulatory Visit (HOSPITAL_COMMUNITY): Payer: Self-pay | Admitting: Psychiatry

## 2022-04-14 DIAGNOSIS — F23 Brief psychotic disorder: Secondary | ICD-10-CM

## 2022-05-23 ENCOUNTER — Emergency Department (HOSPITAL_COMMUNITY): Payer: Commercial Managed Care - HMO

## 2022-05-23 ENCOUNTER — Telehealth: Payer: Self-pay

## 2022-05-23 ENCOUNTER — Emergency Department (HOSPITAL_COMMUNITY)
Admission: EM | Admit: 2022-05-23 | Discharge: 2022-05-23 | Disposition: A | Discharge status: Home / Self Care | Payer: Commercial Managed Care - HMO | Attending: Emergency Medicine | Admitting: Emergency Medicine

## 2022-05-23 ENCOUNTER — Other Ambulatory Visit: Payer: Self-pay

## 2022-05-23 DIAGNOSIS — B20 Human immunodeficiency virus [HIV] disease: Secondary | ICD-10-CM | POA: Insufficient documentation

## 2022-05-23 DIAGNOSIS — U071 COVID-19: Secondary | ICD-10-CM | POA: Insufficient documentation

## 2022-05-23 DIAGNOSIS — R0602 Shortness of breath: Secondary | ICD-10-CM | POA: Diagnosis present

## 2022-05-23 DIAGNOSIS — J111 Influenza due to unidentified influenza virus with other respiratory manifestations: Secondary | ICD-10-CM

## 2022-05-23 LAB — BASIC METABOLIC PANEL
Anion gap: 9 (ref 5–15)
BUN: 7 mg/dL (ref 6–20)
CO2: 21 mmol/L — ABNORMAL LOW (ref 22–32)
Calcium: 8.7 mg/dL — ABNORMAL LOW (ref 8.9–10.3)
Chloride: 105 mmol/L (ref 98–111)
Creatinine, Ser: 1.2 mg/dL (ref 0.61–1.24)
GFR, Estimated: >60 mL/min (ref >60)
Glucose, Bld: 79 mg/dL (ref 70–99)
Potassium: 3.4 mmol/L — ABNORMAL LOW (ref 3.5–5.1)
Sodium: 135 mmol/L (ref 135–145)

## 2022-05-23 LAB — TROPONIN I (HIGH SENSITIVITY): Troponin I (High Sensitivity): 2 ng/L (ref <18)

## 2022-05-23 LAB — LACTIC ACID, PLASMA
Lactic Acid, Venous: 2.3 mmol/L (ref 0.5–1.9)
Lactic Acid, Venous: 2.3 mmol/L (ref 0.5–1.9)

## 2022-05-23 LAB — RESP PANEL BY RT-PCR (FLU A&B, COVID) ARPGX2
Influenza A by PCR: POSITIVE — AB
Influenza B by PCR: NEGATIVE
SARS Coronavirus 2 by RT PCR: POSITIVE — AB

## 2022-05-23 LAB — CBC
HCT: 40.9 % (ref 39.0–52.0)
Hemoglobin: 13.7 g/dL (ref 13.0–17.0)
MCH: 33.9 pg (ref 26.0–34.0)
MCHC: 33.5 g/dL (ref 30.0–36.0)
MCV: 101.2 fL — ABNORMAL HIGH (ref 80.0–100.0)
Platelets: 245 10*3/uL (ref 150–400)
RBC: 4.04 MIL/uL — ABNORMAL LOW (ref 4.22–5.81)
RDW: 11.5 % (ref 11.5–15.5)
WBC: 5.3 10*3/uL (ref 4.0–10.5)
nRBC: 0 % (ref 0.0–0.2)

## 2022-05-23 MED ORDER — SULFAMETHOXAZOLE-TRIMETHOPRIM 800-160 MG PO TABS: 1.0000 | ORAL_TABLET | ORAL | 5 refills | Status: DC

## 2022-05-23 MED ORDER — ACETAMINOPHEN 325 MG PO TABS
650.0000 mg | ORAL_TABLET | Freq: Once | ORAL | Status: AC
  Administered 2022-05-23: 650 mg via ORAL
  Filled 2022-05-23: qty 2

## 2022-05-23 MED ORDER — SODIUM CHLORIDE 0.9 % IV BOLUS
1000.0000 mL | Freq: Once | INTRAVENOUS | Status: AC
  Administered 2022-05-23: 1000 mL via INTRAVENOUS

## 2022-05-23 NOTE — ED Provider Triage Note (Signed)
Emergency Medicine Provider Triage Evaluation Note  Jason Herman , a 36 y.o. male  was evaluated in triage.  Pt complains of chest pain, lightheadedness. States his "breathing hasn't been good." Endorses nausea. Has had symptoms for 4 days. Denies having fever at home, vomiting or diarrhea. Denies sick contacts.   Review of Systems  Positive: See above Negative:   Physical Exam  BP 122/79 (BP Location: Right Arm)   Pulse (!) 118   Temp (!) 100.5 F (38.1 C) (Oral)   Resp 17   Ht 5\' 11"  (1.803 m)   Wt 86.2 kg   SpO2 100%   BMI 26.50 kg/m  Gen:   Awake, no distress   Resp:  Normal effort  MSK:   Moves extremities without difficulty  Other:  Dry cough  Medical Decision Making  Medically screening exam initiated at 12:13 PM.  Appropriate orders placed.  Jason Herman was informed that the remainder of the evaluation will be completed by another provider, this initial triage assessment does not replace that evaluation, and the importance of remaining in the ED until their evaluation is complete.     Donato Heinz, PA-C 05/23/22 1216

## 2022-05-23 NOTE — ED Notes (Signed)
Blue top sent down  

## 2022-05-23 NOTE — ED Provider Notes (Signed)
[facility] DEPT Provider Note   CSN: [id number] Arrival date & time: 05/23/22  1142     History  Chief Complaint  Patient presents with   Chest Pain   Shortness of Breath    Jason Herman is a 36 y.o. male.  36 year old male with prior history as detailed below presents for evaluation.  Patient is notably HIV positive.  Patient reports full compliance with his HIV medications and Bactrim prophylaxis.  Last seen by infectious disease in October.    Patient reports 3 to 4 days of "not feeling good".  Patient complains of dry cough.  He complains of achy pain all over.  He complains of feeling lightheaded.  He denies objective fever.  He does report some subjective fever and chills.  He denies nausea or vomiting.  He denies diarrhea.  He denies urinary symptoms.  The history is provided by the patient and medical records.       Home Medications Prior to Admission medications   Medication Sig Start Date End Date Taking? Authorizing Provider  bictegravir-emtricitabine-tenofovir AF (BIKTARVY) 50-200-25 MG TABS tablet Take 1 tablet by mouth daily. 04/04/22   Mignon Pine, DO  naproxen (NAPROSYN) 500 MG tablet Take 1 tablet (500 mg total) by mouth 2 (two) times daily with a meal. Patient not taking: Reported on 04/04/2022 12/27/21   Mignon Pine, DO  OLANZapine (ZYPREXA) 5 MG tablet TAKE 1 TABLET(5 MG) BY MOUTH AT BEDTIME 04/16/22   Eulis Canner E, NP  sulfamethoxazole-trimethoprim (BACTRIM DS) 800-160 MG tablet Take 1 tablet by mouth 3 (three) times a week. 04/04/22   Mignon Pine, DO      Allergies    Patient has no known allergies.    Review of Systems   Review of Systems  All other systems reviewed and are negative.   Physical Exam Updated Vital Signs BP 122/79 (BP Location: Right Arm)   Pulse (!) 118   Temp (!) 100.5 F (38.1 C) (Oral)   Resp 17   Ht 5\' 11"  (1.803 m)   Wt 86.2 kg   SpO2 100%   BMI 26.50 kg/m   Physical Exam Vitals and nursing note reviewed.  Constitutional:      General: He is not in acute distress.    Appearance: Normal appearance. He is well-developed.  HENT:     Head: Normocephalic and atraumatic.  Eyes:     Conjunctiva/sclera: Conjunctivae normal.     Pupils: Pupils are equal, round, and reactive to light.  Cardiovascular:     Rate and Rhythm: Normal rate and regular rhythm.     Heart sounds: Normal heart sounds.  Pulmonary:     Effort: Pulmonary effort is normal. No respiratory distress.     Breath sounds: Normal breath sounds.  Abdominal:     General: There is no distension.     Palpations: Abdomen is soft.     Tenderness: There is no abdominal tenderness.  Musculoskeletal:        General: No deformity. Normal range of motion.     Cervical back: Normal range of motion and neck supple.  Skin:    General: Skin is warm and dry.  Neurological:     General: No focal deficit present.     Mental Status: He is alert and oriented to person, place, and time.     ED Results / Procedures / Treatments   Labs (all labs ordered are listed, but only abnormal results are displayed) Labs  Reviewed  RESP PANEL BY RT-PCR (FLU A&B, COVID) ARPGX2 - Abnormal; Notable for the following components:      Result Value   SARS Coronavirus 2 by RT PCR POSITIVE (*)    Influenza A by PCR POSITIVE (*)    All other components within normal limits  BASIC METABOLIC PANEL - Abnormal; Notable for the following components:   Potassium 3.4 (*)    CO2 21 (*)    Calcium 8.7 (*)    All other components within normal limits  CBC - Abnormal; Notable for the following components:   RBC 4.04 (*)    MCV 101.2 (*)    All other components within normal limits  LACTIC ACID, PLASMA - Abnormal; Notable for the following components:   Lactic Acid, Venous 2.3 (*)    All other components within normal limits  CULTURE, BLOOD (ROUTINE X 2)  CULTURE, BLOOD (ROUTINE X 2)  LACTIC ACID, PLASMA  TROPONIN I  (HIGH SENSITIVITY)    EKG EKG Interpretation  Date/Time:  Wednesday May 23 2022 11:55:26 EST Ventricular Rate:  110 PR Interval:  141 QRS Duration: 86 QT Interval:  293 QTC Calculation: 397 R Axis:   96 Text Interpretation: Sinus tachycardia Borderline right axis deviation Confirmed by Kristine Royal [phone]) on 05/23/2022 12:39:02 PM  Radiology DG Chest 2 View  Result Date: 05/23/2022 CLINICAL DATA:  Shortness of breath and chest pain.  HIV positive. EXAM: CHEST - 2 VIEW COMPARISON:  AP chest 11/10/2021, 11/02/2021, 10/23/2021; chest two views 10/21/2021; CT chest 10/23/2021 FINDINGS: Cardiac silhouette and mediastinal contours are within limits. There is improved aeration of the bilateral lungs, noting subtle bilateral lung opacities on multiple prior May 2023 radiographs and diffuse ground-glass opacities on 10/23/2021 prior chest CT. The right lung appears clear. There may be subtle increased density overlying the left lower lung, however this appears unchanged from multiple prior May 2023 radiographs. This may represent the waxing and waning mild atypical infection seen on prior CT and radiographs versus mild chronic interstitial thickening. No pleural effusion pneumothorax. No acute skeletal abnormality. IMPRESSION: 1. Multiple bilateral subtle lung opacities were seen on the prior May 2023 radiographs. The right lung appears clear. There is improved aeration of the left upper lung compared to 11/10/2021. There may be subtle increased left lower lung airspace opacification that may be related to the waxing and waning mild atypical infection seen on prior CT and radiographs throughout May 2023. Electronically Signed   By: Neita Garnet M.D.   On: 05/23/2022 12:42    Procedures Procedures    Medications Ordered in ED Medications  acetaminophen (TYLENOL) tablet 650 mg (has no administration in time range)  sodium chloride 0.9 % bolus 1,000 mL (has no administration in time range)     ED Course/ Medical Decision Making/ A&P                           Medical Decision Making Amount and/or Complexity of Data Reviewed Labs: ordered. Radiology: ordered.  Risk Prescription drug management.    Medical Screen Complete  This patient presented to the ED with complaint of cough, fever, fatigue.  This complaint involves an extensive number of treatment options. The initial differential diagnosis includes, but is not limited to, Khtari versus viral infection, metabolic abnormality, etc.  This presentation is: Acute, Self-Limited, Previously Undiagnosed, Uncertain Prognosis, Complicated, Systemic Symptoms, and Threat to Life/Bodily Function  HIV-positive patient with reported full compliance with anti-viral medications and  Bactrim prophylaxis presents with fever, chills, fatigue, cough x4 days.  Notable abnormalities on work-up include positive COVID and influenza test.  Patient without hypoxia.  Patient without evidence of infiltrate on chest x-ray.  Patient is reporting that he had a mild case of COVID approximately 2 to 3 months prior.  It is possible that the patient's positive COVID test is not reflective of acute infection.  Patient with mild elevation lactic acid.  Patient given 2 L IV fluids for suspected mild dehydration.  Patient is feeling improved after ED evaluation.  Patient has been symptomatic for at least 4 days.  No indication for antiviral medication at this time.  Patient is encouraged to closely follow-up with his ID clinic.  He is requesting a refill on his prophylactic Bactrim.  This was provided.  Strict return precautions given and understood.   Additional history obtained:  External records from outside sources obtained and reviewed including prior ED visits and prior Inpatient records.    Lab Tests:  I ordered and personally interpreted labs.  The pertinent results include: CBC, COVID, flu, BMP, lactic acid   Imaging Studies  ordered:  I ordered imaging studies including chest x-ray I independently visualized and interpreted obtained imaging which showed NAD I agree with the radiologist interpretation.   Cardiac Monitoring:  The patient was maintained on a cardiac monitor.  I personally viewed and interpreted the cardiac monitor which showed an underlying rhythm of: Sinus tach then NSR   Medicines ordered:  I ordered medication including IV fluids, Tylenol for suspected mild dehydration, fever Reevaluation of the patient after these medicines showed that the patient: improved   Problem List / ED Course:  Influenza   Reevaluation:  After the interventions noted above, I reevaluated the patient and found that they have: improved Disposition:  After consideration of the diagnostic results and the patients response to treatment, I feel that the patent would benefit from close outpatient followup.          Final Clinical Impression(s) / ED Diagnoses Final diagnoses:  Influenza    Rx / DC Orders ED Discharge Orders          Ordered    sulfamethoxazole-trimethoprim (BACTRIM DS) 800-160 MG tablet  3 times weekly        05/23/22 1522              Valarie Merino, MD 05/23/22 1526

## 2022-05-23 NOTE — Telephone Encounter (Signed)
Patient called with dizziness and cough worried he may have another pneumonia spell. Advised to go to ED.

## 2022-05-23 NOTE — Discharge Instructions (Signed)
Return for any problem.  ?

## 2022-05-23 NOTE — ED Triage Notes (Signed)
Pt BIBA from home. Pt c/o chest px, SOB, cough for 4x days. HIV+, compliant w/med regime. Some nausea, no emesis.  Aox4  BP: 121/69 HR: 99 Spo2: 99 RA CBG: 143

## 2022-05-28 LAB — CULTURE, BLOOD (ROUTINE X 2)
Culture: NO GROWTH
Special Requests: ADEQUATE

## 2022-06-21 ENCOUNTER — Telehealth (HOSPITAL_COMMUNITY): Payer: No Payment, Other | Admitting: Student in an Organized Health Care Education/Training Program

## 2022-07-05 ENCOUNTER — Telehealth: Payer: Self-pay

## 2022-07-05 ENCOUNTER — Ambulatory Visit: Payer: Self-pay | Admitting: Internal Medicine
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
## 2022-07-05 NOTE — Telephone Encounter (Signed)
Error

## 2022-07-06 ENCOUNTER — Ambulatory Visit: Payer: Commercial Managed Care - HMO | Admitting: Pharmacist

## 2022-07-11 ENCOUNTER — Telehealth: Payer: Self-pay

## 2022-07-11 NOTE — Telephone Encounter (Signed)
Pt's family member - Jason Herman called and was notified he needed to be scheduled for an appt. She was urgently trying to get him in Herman to concerns of his medication. I was unable to disclose any information to her Herman to no DPR on file, but was able to schedule an appt. She did voice a lot of concerns about how he is in need of instruction to take all medication that is prescribed to him. She is attempting to help him the best she can and will try to assist with his appts.   She did provide an alternate telephone # that he uses very often in WiFi : [phone] // Added to pt's Chart

## 2022-07-16 ENCOUNTER — Ambulatory Visit: Payer: Self-pay | Admitting: Pharmacist

## 2022-07-16 ENCOUNTER — Telehealth: Payer: Self-pay | Admitting: Pharmacist

## 2022-07-16 NOTE — Telephone Encounter (Signed)
Cumulative HIV Genotype Data  RT Mutations  V179D  PI Mutations    Integrase Mutations     Interpretation of Genotype Data per [employer] HIV Drug Resistance Database:  Nucleoside RTIs  Abacavir - susceptible Zidovudine - susceptible Emtricitabine - susceptible Lamivudine - susceptible Tenofovir - susceptible   Non-Nucleoside RTIs  Doravirine - susceptible Efavirenz - potential low level resistance Etravirine - potential low level resistance Nevirapine - potential low level resistance Rilpivirine - potential low level resistance   Protease Inhibitors  Atazanavir - susceptible Darunavir - susceptible Lopinavir - susceptible   Integrase Inhibitors  Bictegravir - susceptible Cabotegravir - susceptible Dolutegravir - susceptible Elvitegravir - susceptible Raltegravir - susceptible   Jason Herman L. Pieter Fooks, PharmD, BCIDP, AAHIVP, CPP Clinical Pharmacist Practitioner Infectious Diseases [facility] 07/16/2022, 10:24 AM

## 2022-08-18 ENCOUNTER — Other Ambulatory Visit: Payer: Self-pay | Admitting: Internal Medicine

## 2022-08-19 ENCOUNTER — Emergency Department (HOSPITAL_COMMUNITY)
Admission: EM | Admit: 2022-08-19 | Discharge: 2022-08-19 | Disposition: A | Discharge status: Home / Self Care | Payer: Commercial Managed Care - HMO | Attending: Emergency Medicine | Admitting: Emergency Medicine

## 2022-08-19 ENCOUNTER — Encounter (HOSPITAL_COMMUNITY): Payer: Self-pay

## 2022-08-19 ENCOUNTER — Other Ambulatory Visit: Payer: Self-pay

## 2022-08-19 ENCOUNTER — Emergency Department (HOSPITAL_COMMUNITY): Payer: Commercial Managed Care - HMO

## 2022-08-19 DIAGNOSIS — J45909 Unspecified asthma, uncomplicated: Secondary | ICD-10-CM | POA: Insufficient documentation

## 2022-08-19 DIAGNOSIS — Z1152 Encounter for screening for COVID-19: Secondary | ICD-10-CM | POA: Diagnosis not present

## 2022-08-19 DIAGNOSIS — J069 Acute upper respiratory infection, unspecified: Secondary | ICD-10-CM | POA: Insufficient documentation

## 2022-08-19 DIAGNOSIS — R509 Fever, unspecified: Secondary | ICD-10-CM | POA: Diagnosis present

## 2022-08-19 DIAGNOSIS — Z21 Asymptomatic human immunodeficiency virus [HIV] infection status: Secondary | ICD-10-CM | POA: Insufficient documentation

## 2022-08-19 LAB — COMPREHENSIVE METABOLIC PANEL
ALT: 12 U/L (ref 0–44)
AST: 20 U/L (ref 15–41)
Albumin: 3.7 g/dL (ref 3.5–5.0)
Alkaline Phosphatase: 101 U/L (ref 38–126)
Anion gap: 5 (ref 5–15)
BUN: 6 mg/dL (ref 6–20)
CO2: 24 mmol/L (ref 22–32)
Calcium: 8.6 mg/dL — ABNORMAL LOW (ref 8.9–10.3)
Chloride: 109 mmol/L (ref 98–111)
Creatinine, Ser: 1.1 mg/dL (ref 0.61–1.24)
GFR, Estimated: >60 mL/min (ref >60)
Glucose, Bld: 107 mg/dL — ABNORMAL HIGH (ref 70–99)
Potassium: 3.9 mmol/L (ref 3.5–5.1)
Sodium: 138 mmol/L (ref 135–145)
Total Bilirubin: 0.3 mg/dL (ref 0.3–1.2)
Total Protein: 6.8 g/dL (ref 6.5–8.1)

## 2022-08-19 LAB — CBC WITH DIFFERENTIAL/PLATELET
Abs Immature Granulocytes: 0.02 10*3/uL (ref 0.00–0.07)
Basophils Absolute: 0 10*3/uL (ref 0.0–0.1)
Basophils Relative: 1 %
Eosinophils Absolute: 0.5 10*3/uL (ref 0.0–0.5)
Eosinophils Relative: 7 %
HCT: 38.2 % — ABNORMAL LOW (ref 39.0–52.0)
Hemoglobin: 12.6 g/dL — ABNORMAL LOW (ref 13.0–17.0)
Immature Granulocytes: 0 %
Lymphocytes Relative: 35 %
Lymphs Abs: 2.5 10*3/uL (ref 0.7–4.0)
MCH: 33.4 pg (ref 26.0–34.0)
MCHC: 33 g/dL (ref 30.0–36.0)
MCV: 101.3 fL — ABNORMAL HIGH (ref 80.0–100.0)
Monocytes Absolute: 0.5 10*3/uL (ref 0.1–1.0)
Monocytes Relative: 7 %
Neutro Abs: 3.5 10*3/uL (ref 1.7–7.7)
Neutrophils Relative %: 50 %
Platelets: 295 10*3/uL (ref 150–400)
RBC: 3.77 MIL/uL — ABNORMAL LOW (ref 4.22–5.81)
RDW: 11.9 % (ref 11.5–15.5)
WBC: 7 10*3/uL (ref 4.0–10.5)
nRBC: 0 % (ref 0.0–0.2)

## 2022-08-19 LAB — RESP PANEL BY RT-PCR (RSV, FLU A&B, COVID)  RVPGX2
Influenza A by PCR: NEGATIVE
Influenza B by PCR: NEGATIVE
Resp Syncytial Virus by PCR: NEGATIVE
SARS Coronavirus 2 by RT PCR: NEGATIVE

## 2022-08-19 LAB — LACTIC ACID, PLASMA
Lactic Acid, Venous: 2.1 mmol/L (ref 0.5–1.9)
Lactic Acid, Venous: 2.2 mmol/L (ref 0.5–1.9)

## 2022-08-19 MED ORDER — SODIUM CHLORIDE 0.9 % IV BOLUS
1000.0000 mL | Freq: Once | INTRAVENOUS | Status: AC
  Administered 2022-08-19: 1000 mL via INTRAVENOUS

## 2022-08-19 NOTE — Discharge Instructions (Signed)
Follow up with you doctors as needed
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
## 2022-08-19 NOTE — ED Notes (Signed)
PT able to walk to and from restroom with no assist. 02 stat 100%

## 2022-08-25 LAB — CULTURE, BLOOD (ROUTINE X 2): Culture: NO GROWTH

## 2022-09-27 ENCOUNTER — Other Ambulatory Visit: Payer: Self-pay | Admitting: Internal Medicine

## 2022-09-27 DIAGNOSIS — B20 Human immunodeficiency virus [HIV] disease: Secondary | ICD-10-CM

## 2022-11-21 ENCOUNTER — Emergency Department (HOSPITAL_COMMUNITY): Payer: Commercial Managed Care - HMO

## 2022-11-21 ENCOUNTER — Encounter (HOSPITAL_COMMUNITY): Payer: Self-pay

## 2022-11-21 ENCOUNTER — Emergency Department (HOSPITAL_COMMUNITY)
Admission: EM | Admit: 2022-11-21 | Discharge: 2022-11-22 | Disposition: A | Discharge status: Other Acute Inpatient Hospital | Payer: Commercial Managed Care - HMO | Attending: Emergency Medicine | Admitting: Emergency Medicine

## 2022-11-21 DIAGNOSIS — R4182 Altered mental status, unspecified: Secondary | ICD-10-CM | POA: Insufficient documentation

## 2022-11-21 DIAGNOSIS — F19959 Other psychoactive substance use, unspecified with psychoactive substance-induced psychotic disorder, unspecified: Secondary | ICD-10-CM | POA: Diagnosis not present

## 2022-11-21 DIAGNOSIS — F203 Undifferentiated schizophrenia: Secondary | ICD-10-CM | POA: Insufficient documentation

## 2022-11-21 DIAGNOSIS — F1594 Other stimulant use, unspecified with stimulant-induced mood disorder: Secondary | ICD-10-CM

## 2022-11-21 DIAGNOSIS — F1510 Other stimulant abuse, uncomplicated: Secondary | ICD-10-CM

## 2022-11-21 DIAGNOSIS — Z21 Asymptomatic human immunodeficiency virus [HIV] infection status: Secondary | ICD-10-CM | POA: Diagnosis not present

## 2022-11-21 DIAGNOSIS — F1290 Cannabis use, unspecified, uncomplicated: Secondary | ICD-10-CM

## 2022-11-21 DIAGNOSIS — B20 Human immunodeficiency virus [HIV] disease: Secondary | ICD-10-CM

## 2022-11-21 DIAGNOSIS — R000 Tachycardia, unspecified: Secondary | ICD-10-CM | POA: Diagnosis not present

## 2022-11-21 HISTORY — DX: Brief psychotic disorder: F23

## 2022-11-21 LAB — COMPREHENSIVE METABOLIC PANEL
ALT: 18 U/L (ref 0–44)
AST: 30 U/L (ref 15–41)
Albumin: 4.4 g/dL (ref 3.5–5.0)
Alkaline Phosphatase: 89 U/L (ref 38–126)
Anion gap: 12 (ref 5–15)
BUN: 12 mg/dL (ref 6–20)
CO2: 21 mmol/L — ABNORMAL LOW (ref 22–32)
Calcium: 9.1 mg/dL (ref 8.9–10.3)
Chloride: 107 mmol/L (ref 98–111)
Creatinine, Ser: 1.49 mg/dL — ABNORMAL HIGH (ref 0.61–1.24)
GFR, Estimated: >60 mL/min (ref >60)
Glucose, Bld: 89 mg/dL (ref 70–99)
Potassium: 3.6 mmol/L (ref 3.5–5.1)
Sodium: 140 mmol/L (ref 135–145)
Total Bilirubin: 1.5 mg/dL — ABNORMAL HIGH (ref 0.3–1.2)
Total Protein: 7.6 g/dL (ref 6.5–8.1)

## 2022-11-21 LAB — CBC WITH DIFFERENTIAL/PLATELET
Abs Immature Granulocytes: 0.01 10*3/uL (ref 0.00–0.07)
Basophils Absolute: 0 10*3/uL (ref 0.0–0.1)
Basophils Relative: 1 %
Eosinophils Absolute: 0.2 10*3/uL (ref 0.0–0.5)
Eosinophils Relative: 3 %
HCT: 38.8 % — ABNORMAL LOW (ref 39.0–52.0)
Hemoglobin: 13.5 g/dL (ref 13.0–17.0)
Immature Granulocytes: 0 %
Lymphocytes Relative: 32 %
Lymphs Abs: 2 10*3/uL (ref 0.7–4.0)
MCH: 34.1 pg — ABNORMAL HIGH (ref 26.0–34.0)
MCHC: 34.8 g/dL (ref 30.0–36.0)
MCV: 98 fL (ref 80.0–100.0)
Monocytes Absolute: 0.5 10*3/uL (ref 0.1–1.0)
Monocytes Relative: 8 %
Neutro Abs: 3.5 10*3/uL (ref 1.7–7.7)
Neutrophils Relative %: 56 %
Platelets: 262 10*3/uL (ref 150–400)
RBC: 3.96 MIL/uL — ABNORMAL LOW (ref 4.22–5.81)
RDW: 11.2 % — ABNORMAL LOW (ref 11.5–15.5)
WBC: 6.1 10*3/uL (ref 4.0–10.5)
nRBC: 0 % (ref 0.0–0.2)

## 2022-11-21 LAB — URINALYSIS, ROUTINE W REFLEX MICROSCOPIC
Bilirubin Urine: NEGATIVE
Glucose, UA: NEGATIVE mg/dL
Hgb urine dipstick: NEGATIVE
Ketones, ur: NEGATIVE mg/dL
Leukocytes,Ua: NEGATIVE
Nitrite: NEGATIVE
Protein, ur: 30 mg/dL — AB
Specific Gravity, Urine: 1.031 — ABNORMAL HIGH (ref 1.005–1.030)
pH: 5 (ref 5.0–8.0)

## 2022-11-21 LAB — T-HELPER CELLS (CD4) COUNT (NOT AT ARMC)
CD4 % Helper T Cell: 20 % — ABNORMAL LOW (ref 33–65)
CD4 T Cell Abs: 354 /uL — ABNORMAL LOW (ref 400–1790)

## 2022-11-21 LAB — RAPID URINE DRUG SCREEN, HOSP PERFORMED
Amphetamines: POSITIVE — AB
Barbiturates: NOT DETECTED
Benzodiazepines: NOT DETECTED
Cocaine: NOT DETECTED
Opiates: NOT DETECTED
Tetrahydrocannabinol: POSITIVE — AB

## 2022-11-21 LAB — ETHANOL: Alcohol, Ethyl (B): <10 mg/dL (ref <10)

## 2022-11-21 MED ORDER — OLANZAPINE 5 MG PO TBDP
5.0000 mg | ORAL_TABLET | Freq: Three times a day (TID) | ORAL | Status: DC | PRN
  Administered 2022-11-21 (×2): 5 mg via ORAL
  Filled 2022-11-21: qty 1

## 2022-11-21 MED ORDER — LORAZEPAM 2 MG/ML IJ SOLN: 1.0000 mg | Freq: Three times a day (TID) | INTRAMUSCULAR | Status: DC | PRN

## 2022-11-21 MED ORDER — STERILE WATER FOR INJECTION IJ SOLN
INTRAMUSCULAR | Status: AC
  Administered 2022-11-21: 0.5 mL
  Filled 2022-11-21: qty 10

## 2022-11-21 MED ORDER — OLANZAPINE 10 MG PO TBDP: 10.0000 mg | ORAL_TABLET | Freq: Every day | ORAL | Status: DC

## 2022-11-21 MED ORDER — ZIPRASIDONE MESYLATE 20 MG IM SOLR
10.0000 mg | Freq: Once | INTRAMUSCULAR | Status: AC
  Administered 2022-11-21: 10 mg via INTRAMUSCULAR
  Filled 2022-11-21: qty 20

## 2022-11-21 MED ORDER — SODIUM CHLORIDE 0.9 % IV BOLUS
1000.0000 mL | Freq: Once | INTRAVENOUS | Status: AC
  Administered 2022-11-21: 1000 mL via INTRAVENOUS

## 2022-11-21 MED ORDER — LORAZEPAM 2 MG/ML IJ SOLN
2.0000 mg | Freq: Once | INTRAMUSCULAR | Status: DC
  Filled 2022-11-21: qty 1

## 2022-11-21 MED ORDER — OLANZAPINE 5 MG PO TBDP
5.0000 mg | ORAL_TABLET | Freq: Every day | ORAL | Status: DC
  Filled 2022-11-21: qty 1

## 2022-11-21 MED ORDER — OLANZAPINE 10 MG IM SOLR: 5.0000 mg | Freq: Three times a day (TID) | INTRAMUSCULAR | Status: DC | PRN

## 2022-11-21 MED ORDER — LORAZEPAM 1 MG PO TABS
1.0000 mg | ORAL_TABLET | Freq: Three times a day (TID) | ORAL | Status: DC | PRN
  Administered 2022-11-21: 1 mg via ORAL
  Filled 2022-11-21: qty 1

## 2022-11-21 MED ORDER — HYDROXYZINE HCL 25 MG PO TABS: 25.0000 mg | ORAL_TABLET | Freq: Three times a day (TID) | ORAL | Status: DC | PRN

## 2022-11-21 MED ORDER — BENZTROPINE MESYLATE 1 MG PO TABS
1.0000 mg | ORAL_TABLET | Freq: Every day | ORAL | Status: DC
  Administered 2022-11-21: 1 mg via ORAL
  Filled 2022-11-21: qty 1
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
## 2022-11-21 NOTE — ED Notes (Signed)
One belongings bag placed in locker #27

## 2022-11-21 NOTE — ED Notes (Signed)
PO fluids encouraged due to increased urine specific gravity and elevated  serum CK but pt refused fluids but they remain available to him on pt bedside stand.

## 2022-11-21 NOTE — ED Notes (Signed)
Pt given dinner tray.  Pt noted to being having a full conversation w/ self.  Pt unable to have a  coherent conversation w/ staff.
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
## 2022-11-21 NOTE — Progress Notes (Signed)
LCSW Progress Note  [id number]   Jason Herman  11/21/2022  11:37 PM    Inpatient Behavioral Health Placement  Pt meets inpatient criteria per Earney Navy, NP-PMHNP-BC. There are no available beds within [facility] system per [facility] Kim Brooks,RN. Referral was sent to the following facilities;    Destination  Service Provider Address Phone Fax  [facility]  [address] [place] [address] [phone]  [facility]  [address] [place], [place] [address] [phone]  [facility]  [address] [place] [address] [phone]  [facility]  [address] [place] [address] [phone]  [facility]  [id number] [place] [address] [phone]  [facility]  [address] [place], [place] [address] [phone]  [facility]  [address] [place] [address] [phone]  [facility]  [address] [place] [address] [phone]  [facility]  [address] [place] [address] [phone]  [facility]  [address] [place] [address] [phone]  [facility]  [address], [place] [address] [phone]  [facility]  [address], [place] [address] [phone]  [facility]  [address] [place] [address] [phone]  [facility]  [address] [place] [address] [phone]  [facility]  [address] [place] [address] [phone]  [facility]  [address] [place] [address] [phone]  [facility]  [address] [place] [address] [phone]  [facility]  [address] [place] [address] [phone]  [facility]  [address] [place] [address] [phone]  [facility]  [address] [place] [address] [phone]  [facility]  [address] [place], [place] [address] [phone]  [facility] [address]  [address], [place] [address] [phone]   Situation ongoing,  CSW will follow up.    Maryjean Ka, MSW, [facility] 11/21/2022 11:37 PM

## 2022-11-21 NOTE — Progress Notes (Signed)
Inpatient Behavioral Health Placement   Per Earney Navy, NP-PMHNP-BC pt meets inpatient behavioral health placement. This Psych Disposition CSW has requested pt to be reviewed by [facility] Herold Harms. CSW/ Disposition team will assist and follow with inpatient behavioral health placement.   Maryjean Ka, MSW, LCSWA 11/21/2022 10:02 PM

## 2022-11-21 NOTE — ED Notes (Signed)
Pt very restless and animated continues to be responding to internal stimuli as evidenced by on going rambling conversation with self.  Occasional requires reminded about being loud but does follow staff redirection for now. Medicated with po ativan to decrease anxiety along with routine night medications.

## 2022-11-21 NOTE — ED Triage Notes (Signed)
Pt presents with EMS and [employer]. Family called PD to house since pt was naked in the street yelling and acting out. Suspicion for cocaine and meth use. Pt rambling nonsense in triage, making eye contact but not answering questions. Family taking out IVC?  140/90 HR 110 100% RA CBG 103
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
## 2022-11-21 NOTE — Consult Note (Signed)
Unable to see patient who is sleeping after Geodon was given.  Will try again when awake. Found naked and yelling in the street.  In triage was rambling nonsense.

## 2022-11-21 NOTE — ED Notes (Signed)
Pt still sleeping/sedated from medication- unable to participate in TTS at this time

## 2022-11-22 LAB — URINE CULTURE: Culture: NO GROWTH

## 2022-11-22 NOTE — Discharge Instructions (Addendum)
Transfer to [place] [facility] 

## 2022-11-22 NOTE — Progress Notes (Signed)
Pt was accepted to [facility] 11/22/2022; Bed Assignment Main Campus PENDING A week supply of HIV Medication.  [address] Fax Number: [phone] (Adult)  Pt meets inpatient criteria per Earney Navy, NP-PMHNP-BC     Attending Physician will be Dr. Loni Beckwith   Report can be called to:[phone]-Pager number, please leave a returned phone number to receive a phone call back.   Pt can arrive after 9:00am   Care Team notified:Bobby Shon Baton, RN, Earney Navy, NP-PMHNP-BC, [place], [place]  Maryjean Ka, MSW, [facility] 11/22/2022 1:21 AM

## 2022-11-22 NOTE — ED Notes (Signed)
All required paperwork

## 2022-11-22 NOTE — ED Provider Notes (Signed)
Emergency Medicine Observation Re-evaluation Note  CAYMEN BUMANGLAG is a 37 y.o. male, seen on rounds today.  Pt initially presented to the ED for complaints of unusual behavior, and methamphetamine abuse. Pt currently resting, calm. No new c/o this AM.   Physical Exam  BP 111/75 (BP Location: Right Arm)   Pulse 72   Temp 98 F (36.7 C) (Oral)   Resp 18   Ht 1.803 m (5\' 11" )   SpO2 100%   BMI 26.50 kg/m  Physical Exam General: resting.  Cardiac: regular rate.  Lungs: breathing comfortably. Psych: calm.  ED Course / MDM   I have reviewed the labs performed to date as well as medications administered while in observation.  Recent changes in the last 24 hours include ED obs, med management, reassessment.   Plan  Pt has been accepted at [facility], Dr Sofie Hartigan.  Pt vitals normal. No distress. Pt appears stable for transport/transfer.     Cathren Laine, MD 11/22/22 [phone]

## 2022-11-22 NOTE — Progress Notes (Signed)
Disposition CSW confirmed with [facility] admissions that family could bring medication. This CSW contacted [place] who confirmed that the pt does have a bottle of Biktarvy at her mother's house. [name] informed she will take the medications to [facility].

## 2022-11-22 NOTE — Progress Notes (Addendum)
This CSW was contacted by RN via phone to assist with securing pt's 7 day supply of HIV medication. RN informed Jason Herman was already here to transport pt. It was reported that the pt has not been on the medication in 30 days. This CSW informed RN that someone would need to contact pt's family to inquire about bringing a 7 day supply in to be sent with pt to [facility]. This CSW attempted to contact Psych NP and was notified by Psych NT that the pt was already discharged and is en route to [facility].   Disposition CSW will continue to outreach to [facility] to inquire about whether a 7 day supply may be e-scribed. She has not had success making contact as of 10:30 AM.   This CSW spoke with pt's cousin, Jason Herman, who reported pt has been homeless for a while; however comes in and out of her mothers home. Jason Herman reports she is the person who picks up the pt's Biktarvy each month. She stated he receives it for free from the preferred pharmacy in his chart. She reports she just picked a bottle up in May. Jason Herman stated she and her mother just found 3 bottles of pills and will verify if either are Biktarvy and inform this CSW.

## 2023-01-22 ENCOUNTER — Encounter (HOSPITAL_COMMUNITY): Payer: Self-pay

## 2023-01-22 ENCOUNTER — Emergency Department (HOSPITAL_COMMUNITY)
Admission: EM | Admit: 2023-01-22 | Discharge: 2023-01-22 | Disposition: A | Discharge status: Home / Self Care | Payer: Commercial Managed Care - HMO | Attending: Emergency Medicine | Admitting: Emergency Medicine

## 2023-01-22 ENCOUNTER — Other Ambulatory Visit: Payer: Self-pay

## 2023-01-22 DIAGNOSIS — R21 Rash and other nonspecific skin eruption: Secondary | ICD-10-CM | POA: Diagnosis present

## 2023-01-22 DIAGNOSIS — Z21 Asymptomatic human immunodeficiency virus [HIV] infection status: Secondary | ICD-10-CM | POA: Insufficient documentation

## 2023-01-22 MED ORDER — MUPIROCIN CALCIUM 2 % EX CREA
TOPICAL_CREAM | Freq: Every day | CUTANEOUS | Status: DC
  Filled 2023-01-22: qty 15

## 2023-01-22 MED ORDER — MUPIROCIN CALCIUM 2 % EX CREA: 1.0000 | TOPICAL_CREAM | Freq: Three times a day (TID) | CUTANEOUS | 0 refills | Status: AC

## 2023-01-22 NOTE — Discharge Instructions (Addendum)
I have sent a prescription for Mupirocin to your pharmacy. Apply this cream to your rash up to 3 times a day as needed. Additionally, I have sent a referral for a primary care provider.  They should call you within the next several days to schedule an appointment.  Follow-up with them in 3 to 5 days for reevaluation of your rash.  Get help right away if: You get confused. You have a severe headache, a stiff neck, or severe joint pain or stiffness. You become very sleepy or not responsive. You have a seizure.

## 2023-01-22 NOTE — ED Provider Notes (Signed)
[place] EMERGENCY DEPARTMENT AT [facility] Provider Note   CSN: [id number] Arrival date & time: 01/22/23  1221     History  Chief Complaint  Patient presents with   Rash    Jason Herman is a 38 y.o. male with a history of HIV, schizophrenia, and substance abuse who presents to the ED for a rash on his back. Patient reports he has had pustules on his back for the past 2 weeks with an associated burning sensation. Denies any new soaps or detergents.  Denies history of allergies.  He has not tried any medications to help with the rash. No other complaints or concerns at this time.    Home Medications Prior to Admission medications   Medication Sig Start Date End Date Taking? Authorizing Provider  mupirocin cream (BACTROBAN) 2 % Apply 1 Application topically 3 (three) times daily for 14 days. Applied to the rash 3 times a day as needed 01/22/23 02/05/23 Yes Maxwell Marion, PA-C  bictegravir-emtricitabine-tenofovir AF (BIKTARVY) 50-200-25 MG TABS tablet Take 1 tablet by mouth daily. PLEASE CALL [phone] TO MAKE AN APPOINTMENT Patient not taking: Reported on 11/21/2022 09/27/22   Kathlynn Grate, DO  naproxen (NAPROSYN) 500 MG tablet Take 1 tablet (500 mg total) by mouth 2 (two) times daily with a meal. Patient not taking: Reported on 11/21/2022 12/27/21   Kathlynn Grate, DO  OLANZapine (ZYPREXA) 5 MG tablet TAKE 1 TABLET(5 MG) BY MOUTH AT BEDTIME Patient not taking: Reported on 11/21/2022 04/16/22   Shanna Cisco, NP  sulfamethoxazole-trimethoprim (BACTRIM DS) 800-160 MG tablet Take 1 tablet by mouth 3 (three) times a week. Patient not taking: Reported on 11/21/2022 05/23/22   Wynetta Fines, MD      Allergies    Patient has no known allergies.    Review of Systems   Review of Systems  Skin:  Positive for rash.  All other systems reviewed and are negative.   Physical Exam Updated Vital Signs BP 116/74   Pulse 67   Temp 97.6 F (36.4 C) (Oral)   Resp 18   Ht  5\' 11"  (1.803 m)   Wt 86.2 kg   SpO2 99%   BMI 26.50 kg/m  Physical Exam Vitals and nursing note reviewed.  Constitutional:      Appearance: Normal appearance.  HENT:     Head: Normocephalic and atraumatic.     Mouth/Throat:     Mouth: Mucous membranes are moist.  Eyes:     Conjunctiva/sclera: Conjunctivae normal.     Pupils: Pupils are equal, round, and reactive to light.  Cardiovascular:     Rate and Rhythm: Normal rate and regular rhythm.     Pulses: Normal pulses.  Pulmonary:     Effort: Pulmonary effort is normal.     Breath sounds: Normal breath sounds.  Abdominal:     Palpations: Abdomen is soft.     Tenderness: There is no abdominal tenderness.  Skin:    General: Skin is warm and dry.     Comments: Scattered pustules across back  Neurological:     General: No focal deficit present.     Mental Status: He is alert.     Sensory: No sensory deficit.     Motor: No weakness.  Psychiatric:        Mood and Affect: Mood normal.        Behavior: Behavior normal.     ED Results / Procedures / Treatments   Labs (all labs  ordered are listed, but only abnormal results are displayed) Labs Reviewed - No data to display  EKG None  Radiology No results found.  Procedures Procedures: not indicated.   Medications Ordered in ED Medications  mupirocin cream (BACTROBAN) 2 % ( Topical Given 01/22/23 1726)    ED Course/ Medical Decision Making/ A&P                                 Medical Decision Making  This patient presents to the ED for concern of rash, this involves an extensive number of treatment options, and is a complaint that carries with it a high risk of complications and morbidity.   Differential diagnosis includes: contact dermatitis, folliculitis, etc.   Co morbidities that complicate the patient evaluation  See HPI above   Additional history  Additional history obtained from patient's records.   Problem List / ED Course / Critical  interventions / Medication management  Rash on the back x 2 weeks I ordered medications including:  Mupirocin cream for rash  I have reviewed the patients home medicines and have made adjustments as needed   Social Determinants of Health  Drug use   Test / Admission - Considered  Prescription for Mupirocin sent to the pharmacy. Patient is stable and safe for discharge home.  Return precautions provided.       Final Clinical Impression(s) / ED Diagnoses Final diagnoses:  Rash    Rx / DC Orders ED Discharge Orders          Ordered    mupirocin cream (BACTROBAN) 2 %  3 times daily        01/22/23 1656    Ambulatory referral to [facility]        01/22/23 1707              Maxwell Marion, PA-C 01/22/23 1735    Linwood Dibbles, MD 01/24/23 [phone]

## 2023-01-22 NOTE — ED Triage Notes (Signed)
Patient BIB EMS for rash on his back x 2 weeks. Patient has hx of drug use.

## 2023-03-05 ENCOUNTER — Telehealth: Payer: Self-pay

## 2023-03-05 NOTE — Progress Notes (Signed)
[facility] Referral Notice  Attempts to reach patient to re-engage in ID clinic care have not been successful. Patient is considered out of care from the ID clinic. Patient referred to [facility] to confirm or assist in engagement in HIV care.   [facility] Notes: out of care  Last HIV Viral Load:  HIV 1 RNA Quant  Date Value Ref Range Status  04/04/2022 47 (H) Copies/mL Final    Last [facility] Visit: 04/04/22  Duration of Service: 5 minutes  Sandie Ano, RN

## 2023-03-05 NOTE — Telephone Encounter (Signed)
Called patient to schedule overdue appt. Spoke with his cousin who states that she has been trying to schedule appt, but patient has been dealing with behavorial health issues as well as disappearing from home.  Would like to hold off on scheduling appointment at this time. Will need to schedule appt for additional refills. Juanita Laster, RMA

## 2023-04-05 ENCOUNTER — Ambulatory Visit: Payer: Managed Care, Other (non HMO) | Admitting: Internal Medicine

## 2023-04-05 ENCOUNTER — Ambulatory Visit: Payer: Managed Care, Other (non HMO)

## 2023-04-07 ENCOUNTER — Other Ambulatory Visit: Payer: Self-pay

## 2023-04-07 ENCOUNTER — Emergency Department (HOSPITAL_COMMUNITY): Payer: Managed Care, Other (non HMO)

## 2023-04-07 ENCOUNTER — Inpatient Hospital Stay (HOSPITAL_COMMUNITY)
Admission: EM | Admit: 2023-04-07 | Discharge: 2023-04-11 | DRG: 977 | Disposition: A | Discharge status: Home / Self Care | Payer: Managed Care, Other (non HMO) | Attending: Family Medicine | Admitting: Family Medicine

## 2023-04-07 ENCOUNTER — Encounter (HOSPITAL_COMMUNITY): Payer: Self-pay

## 2023-04-07 DIAGNOSIS — I959 Hypotension, unspecified: Secondary | ICD-10-CM | POA: Diagnosis not present

## 2023-04-07 DIAGNOSIS — D709 Neutropenia, unspecified: Secondary | ICD-10-CM | POA: Diagnosis present

## 2023-04-07 DIAGNOSIS — F17210 Nicotine dependence, cigarettes, uncomplicated: Secondary | ICD-10-CM | POA: Diagnosis present

## 2023-04-07 DIAGNOSIS — R6510 Systemic inflammatory response syndrome (SIRS) of non-infectious origin without acute organ dysfunction: Secondary | ICD-10-CM | POA: Diagnosis present

## 2023-04-07 DIAGNOSIS — E871 Hypo-osmolality and hyponatremia: Secondary | ICD-10-CM | POA: Diagnosis present

## 2023-04-07 DIAGNOSIS — E861 Hypovolemia: Secondary | ICD-10-CM | POA: Diagnosis present

## 2023-04-07 DIAGNOSIS — T50916A Underdosing of multiple unspecified drugs, medicaments and biological substances, initial encounter: Secondary | ICD-10-CM | POA: Diagnosis present

## 2023-04-07 DIAGNOSIS — Z1152 Encounter for screening for COVID-19: Secondary | ICD-10-CM

## 2023-04-07 DIAGNOSIS — D649 Anemia, unspecified: Secondary | ICD-10-CM | POA: Diagnosis present

## 2023-04-07 DIAGNOSIS — Z21 Asymptomatic human immunodeficiency virus [HIV] infection status: Secondary | ICD-10-CM | POA: Diagnosis present

## 2023-04-07 DIAGNOSIS — Z91148 Patient's other noncompliance with medication regimen for other reason: Secondary | ICD-10-CM | POA: Diagnosis not present

## 2023-04-07 DIAGNOSIS — F17290 Nicotine dependence, other tobacco product, uncomplicated: Secondary | ICD-10-CM | POA: Diagnosis present

## 2023-04-07 DIAGNOSIS — B20 Human immunodeficiency virus [HIV] disease: Secondary | ICD-10-CM | POA: Diagnosis present

## 2023-04-07 DIAGNOSIS — E872 Acidosis, unspecified: Secondary | ICD-10-CM | POA: Diagnosis present

## 2023-04-07 DIAGNOSIS — F23 Brief psychotic disorder: Secondary | ICD-10-CM

## 2023-04-07 DIAGNOSIS — Z8701 Personal history of pneumonia (recurrent): Secondary | ICD-10-CM

## 2023-04-07 DIAGNOSIS — R5081 Fever presenting with conditions classified elsewhere: Secondary | ICD-10-CM | POA: Diagnosis present

## 2023-04-07 DIAGNOSIS — F209 Schizophrenia, unspecified: Secondary | ICD-10-CM | POA: Diagnosis present

## 2023-04-07 LAB — RESP PANEL BY RT-PCR (RSV, FLU A&B, COVID)  RVPGX2
Influenza A by PCR: NEGATIVE
Influenza B by PCR: NEGATIVE
Resp Syncytial Virus by PCR: NEGATIVE
SARS Coronavirus 2 by RT PCR: NEGATIVE

## 2023-04-07 LAB — CBC WITH DIFFERENTIAL/PLATELET
Abs Immature Granulocytes: 0.01 10*3/uL (ref 0.00–0.07)
Basophils Absolute: 0 10*3/uL (ref 0.0–0.1)
Basophils Relative: 1 %
Eosinophils Absolute: 0 10*3/uL (ref 0.0–0.5)
Eosinophils Relative: 0 %
HCT: 37.9 % — ABNORMAL LOW (ref 39.0–52.0)
Hemoglobin: 12.4 g/dL — ABNORMAL LOW (ref 13.0–17.0)
Immature Granulocytes: 0 %
Lymphocytes Relative: 49 %
Lymphs Abs: 1.2 10*3/uL (ref 0.7–4.0)
MCH: 32.5 pg (ref 26.0–34.0)
MCHC: 32.7 g/dL (ref 30.0–36.0)
MCV: 99.5 fL (ref 80.0–100.0)
Monocytes Absolute: 0.3 10*3/uL (ref 0.1–1.0)
Monocytes Relative: 13 %
Neutro Abs: 0.9 10*3/uL — ABNORMAL LOW (ref 1.7–7.7)
Neutrophils Relative %: 37 %
Platelets: 215 10*3/uL (ref 150–400)
RBC: 3.81 MIL/uL — ABNORMAL LOW (ref 4.22–5.81)
RDW: 12.6 % (ref 11.5–15.5)
Smear Review: NORMAL
WBC: 2.4 10*3/uL — ABNORMAL LOW (ref 4.0–10.5)
nRBC: 0 % (ref 0.0–0.2)

## 2023-04-07 LAB — I-STAT CG4 LACTIC ACID, ED: Lactic Acid, Venous: 2 mmol/L (ref 0.5–1.9)

## 2023-04-07 LAB — COMPREHENSIVE METABOLIC PANEL
ALT: 14 U/L (ref 0–44)
AST: 22 U/L (ref 15–41)
Albumin: 3.5 g/dL (ref 3.5–5.0)
Alkaline Phosphatase: 71 U/L (ref 38–126)
Anion gap: 11 (ref 5–15)
BUN: 10 mg/dL (ref 6–20)
CO2: 20 mmol/L — ABNORMAL LOW (ref 22–32)
Calcium: 8.8 mg/dL — ABNORMAL LOW (ref 8.9–10.3)
Chloride: 101 mmol/L (ref 98–111)
Creatinine, Ser: 1.1 mg/dL (ref 0.61–1.24)
GFR, Estimated: >60 mL/min (ref >60)
Glucose, Bld: 94 mg/dL (ref 70–99)
Potassium: 3.9 mmol/L (ref 3.5–5.1)
Sodium: 132 mmol/L — ABNORMAL LOW (ref 135–145)
Total Bilirubin: 0.7 mg/dL (ref 0.3–1.2)
Total Protein: 7.2 g/dL (ref 6.5–8.1)

## 2023-04-07 LAB — URINALYSIS, W/ REFLEX TO CULTURE (INFECTION SUSPECTED)
Bilirubin Urine: NEGATIVE
Glucose, UA: NEGATIVE mg/dL
Hgb urine dipstick: NEGATIVE
Ketones, ur: NEGATIVE mg/dL
Leukocytes,Ua: NEGATIVE
Nitrite: NEGATIVE
Protein, ur: NEGATIVE mg/dL
Specific Gravity, Urine: 1.011 (ref 1.005–1.030)
pH: 6 (ref 5.0–8.0)

## 2023-04-07 LAB — PROTIME-INR
INR: 0.9 (ref 0.8–1.2)
Prothrombin Time: 12.7 s (ref 11.4–15.2)

## 2023-04-07 LAB — RAPID URINE DRUG SCREEN, HOSP PERFORMED
Amphetamines: POSITIVE — AB
Barbiturates: NOT DETECTED
Benzodiazepines: NOT DETECTED
Cocaine: NOT DETECTED
Opiates: NOT DETECTED
Tetrahydrocannabinol: POSITIVE — AB

## 2023-04-07 LAB — LACTIC ACID, PLASMA: Lactic Acid, Venous: 0.8 mmol/L (ref 0.5–1.9)

## 2023-04-07 MED ORDER — OLANZAPINE 5 MG PO TABS
5.0000 mg | ORAL_TABLET | Freq: Every day | ORAL | Status: DC
  Administered 2023-04-08: 5 mg via ORAL
  Filled 2023-04-07 (×4): qty 1

## 2023-04-07 MED ORDER — POLYETHYLENE GLYCOL 3350 17 G PO PACK: 17.0000 g | PACK | Freq: Every day | ORAL | Status: DC | PRN

## 2023-04-07 MED ORDER — ACETAMINOPHEN 325 MG PO TABS
650.0000 mg | ORAL_TABLET | Freq: Once | ORAL | Status: DC
  Filled 2023-04-07: qty 2

## 2023-04-07 MED ORDER — SODIUM CHLORIDE 0.9 % IV SOLN: INTRAVENOUS | Status: AC

## 2023-04-07 MED ORDER — LIDOCAINE HCL (PF) 1 % IJ SOLN
5.0000 mL | Freq: Once | INTRAMUSCULAR | Status: AC
  Administered 2023-04-07: 5 mL
  Filled 2023-04-07: qty 5

## 2023-04-07 MED ORDER — ENSURE ENLIVE PO LIQD
237.0000 mL | Freq: Two times a day (BID) | ORAL | Status: DC
Start: 2023-04-07 — End: 2023-04-08
  Administered 2023-04-07: 237 mL via ORAL
  Filled 2023-04-07: qty 237

## 2023-04-07 MED ORDER — SODIUM CHLORIDE 0.9 % IV BOLUS
1000.0000 mL | Freq: Once | INTRAVENOUS | Status: AC
  Administered 2023-04-07: 1000 mL via INTRAVENOUS

## 2023-04-07 MED ORDER — SODIUM CHLORIDE 0.9 % IV SOLN
2.0000 g | Freq: Once | INTRAVENOUS | Status: AC
  Administered 2023-04-07: 2 g via INTRAVENOUS
  Filled 2023-04-07: qty 12.5

## 2023-04-07 MED ORDER — SULFAMETHOXAZOLE-TRIMETHOPRIM 800-160 MG PO TABS
1.0000 | ORAL_TABLET | ORAL | Status: DC
  Administered 2023-04-08: 1 via ORAL
  Filled 2023-04-07: qty 1

## 2023-04-07 MED ORDER — ENOXAPARIN SODIUM 40 MG/0.4ML IJ SOSY
40.0000 mg | PREFILLED_SYRINGE | INTRAMUSCULAR | Status: DC
Start: 2023-04-08 — End: 2023-04-11
  Administered 2023-04-08 – 2023-04-10 (×3): 40 mg via SUBCUTANEOUS
  Filled 2023-04-07 (×3): qty 0.4

## 2023-04-07 MED ORDER — ACETAMINOPHEN 500 MG PO TABS
1000.0000 mg | ORAL_TABLET | Freq: Once | ORAL | Status: AC
  Administered 2023-04-07: 1000 mg via ORAL

## 2023-04-07 MED ORDER — ACETAMINOPHEN 325 MG PO TABS
650.0000 mg | ORAL_TABLET | Freq: Four times a day (QID) | ORAL | Status: DC | PRN
  Administered 2023-04-08 – 2023-04-11 (×3): 650 mg via ORAL
  Filled 2023-04-07 (×3): qty 2

## 2023-04-07 MED ORDER — BICTEGRAVIR-EMTRICITAB-TENOFOV 50-200-25 MG PO TABS
1.0000 | ORAL_TABLET | Freq: Every day | ORAL | Status: DC
  Administered 2023-04-08 – 2023-04-11 (×4): 1 via ORAL
  Filled 2023-04-07 (×4): qty 1

## 2023-04-07 MED ORDER — VANCOMYCIN HCL 1750 MG/350ML IV SOLN
1750.0000 mg | Freq: Once | INTRAVENOUS | Status: AC
  Administered 2023-04-07: 1750 mg via INTRAVENOUS
  Filled 2023-04-07: qty 350

## 2023-04-07 MED ORDER — METRONIDAZOLE 500 MG/100ML IV SOLN
500.0000 mg | Freq: Once | INTRAVENOUS | Status: AC
  Administered 2023-04-07: 500 mg via INTRAVENOUS
  Filled 2023-04-07: qty 100

## 2023-04-07 MED ORDER — OXYCODONE HCL 5 MG PO TABS: 5.0000 mg | ORAL_TABLET | Freq: Four times a day (QID) | ORAL | Status: DC | PRN

## 2023-04-07 MED ORDER — VANCOMYCIN HCL IN DEXTROSE 1-5 GM/200ML-% IV SOLN: 1000.0000 mg | Freq: Once | INTRAVENOUS | Status: DC

## 2023-04-07 MED ORDER — PROCHLORPERAZINE EDISYLATE 10 MG/2ML IJ SOLN: 5.0000 mg | Freq: Four times a day (QID) | INTRAMUSCULAR | Status: DC | PRN

## 2023-04-07 MED ORDER — MELATONIN 5 MG PO TABS
5.0000 mg | ORAL_TABLET | Freq: Every evening | ORAL | Status: DC | PRN
  Filled 2023-04-07: qty 1

## 2023-04-07 NOTE — ED Triage Notes (Signed)
Reports he has been out of his biktarvy x 1.5 weeks and is now feeling weak.  No other complaints other than weakness.
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
## 2023-04-07 NOTE — Sepsis Progress Note (Signed)
Elink following for sepsis protocol. 

## 2023-04-07 NOTE — ED Provider Notes (Signed)
[place] EMERGENCY DEPARTMENT AT [facility] Provider Note   CSN: [id number] Arrival date & time: 04/07/23  1803     History  Chief Complaint  Patient presents with   Medication Refill    Jason Herman is a 37 y.o. male, history of HIV, psychosis, who presents to the ED secondary to fever, chills, shortness of breath, cough that is been going on for the last 3 days.  States he ran out of his Biktarvy, about a week and a half ago, and has felt unwell for the last 3 days.  He denies any nausea, vomiting, abdominal pain.  Does not know what his fevers have been but feels very hot cold.  Also has a complaint of a splinter in his right hand, feels like it stuck.  States it went in about a day ago   Unknown last tdap.  Home Medications Prior to Admission medications   Medication Sig Start Date End Date Taking? Authorizing Provider  bictegravir-emtricitabine-tenofovir AF (BIKTARVY) 50-200-25 MG TABS tablet Take 1 tablet by mouth daily. PLEASE CALL [phone] TO MAKE AN APPOINTMENT Patient not taking: Reported on 11/21/2022 09/27/22   Kathlynn Grate, DO  naproxen (NAPROSYN) 500 MG tablet Take 1 tablet (500 mg total) by mouth 2 (two) times daily with a meal. Patient not taking: Reported on 11/21/2022 12/27/21   Kathlynn Grate, DO  OLANZapine (ZYPREXA) 5 MG tablet TAKE 1 TABLET(5 MG) BY MOUTH AT BEDTIME Patient not taking: Reported on 11/21/2022 04/16/22   Shanna Cisco, NP  sulfamethoxazole-trimethoprim (BACTRIM DS) 800-160 MG tablet Take 1 tablet by mouth 3 (three) times a week. Patient not taking: Reported on 11/21/2022 05/23/22   Wynetta Fines, MD      Allergies    Patient has no known allergies.    Review of Systems   Review of Systems  Constitutional:  Positive for fever.  Respiratory:  Positive for shortness of breath.   Gastrointestinal:  Negative for abdominal pain.    Physical Exam Updated Vital Signs BP (!) 124/106   Pulse (!) 104   Temp (!) 102.7 F  (39.3 C) (Oral)   Resp 18   Ht 5\' 11"  (1.803 m)   Wt 86.2 kg   SpO2 94%   BMI 26.50 kg/m  Physical Exam Vitals and nursing note reviewed.  Constitutional:      General: He is not in acute distress.    Appearance: He is well-developed.  HENT:     Head: Normocephalic and atraumatic.  Eyes:     Conjunctiva/sclera: Conjunctivae normal.     Comments: Pinpoint pupils  Cardiovascular:     Rate and Rhythm: Normal rate and regular rhythm.     Heart sounds: No murmur heard. Pulmonary:     Effort: Pulmonary effort is normal. No respiratory distress.     Breath sounds: Normal breath sounds.  Abdominal:     Palpations: Abdomen is soft.     Tenderness: There is no abdominal tenderness.  Musculoskeletal:        General: No swelling.     Cervical back: Neck supple.  Skin:    General: Skin is warm and dry.     Capillary Refill: Capillary refill takes less than 2 seconds.     Comments: Hard object present, on the palmar aspect of the right hand, in the middle of the hand.  Without any overlying erythema, edema.   Neurological:     Mental Status: He is alert.  Psychiatric:  Mood and Affect: Mood normal.     ED Results / Procedures / Treatments   Labs (all labs ordered are listed, but only abnormal results are displayed) Labs Reviewed  COMPREHENSIVE METABOLIC PANEL - Abnormal; Notable for the following components:      Result Value   Sodium 132 (*)    CO2 20 (*)    Calcium 8.8 (*)    All other components within normal limits  CBC WITH DIFFERENTIAL/PLATELET - Abnormal; Notable for the following components:   WBC 2.4 (*)    RBC 3.81 (*)    Hemoglobin 12.4 (*)    HCT 37.9 (*)    Neutro Abs 0.9 (*)    All other components within normal limits  I-STAT CG4 LACTIC ACID, ED - Abnormal; Notable for the following components:   Lactic Acid, Venous 2.0 (*)    All other components within normal limits  CULTURE, BLOOD (ROUTINE X 2)  CULTURE, BLOOD (ROUTINE X 2)  RESP PANEL BY  RT-PCR (RSV, FLU A&B, COVID)  RVPGX2  PROTIME-INR  URINALYSIS, W/ REFLEX TO CULTURE (INFECTION SUSPECTED)  T-HELPER CELLS (CD4) COUNT (NOT AT [facility])  RAPID URINE DRUG SCREEN, HOSP PERFORMED  PATHOLOGIST SMEAR REVIEW  I-STAT CG4 LACTIC ACID, ED    EKG None  Radiology DG Chest 2 View  Result Date: 04/07/2023 CLINICAL DATA:  Sepsis EXAM: CHEST - 2 VIEW COMPARISON:  None Available. FINDINGS: The heart size and mediastinal contours are within normal limits. Both lungs are clear. The visualized skeletal structures are unremarkable. IMPRESSION: No active cardiopulmonary disease. Electronically Signed   By: Helyn Numbers M.D.   On: 04/07/2023 20:25    Procedures .Foreign Body Removal  Date/Time: 04/07/2023 8:19 PM  Performed by: Pete Pelt, PA Authorized by: Pete Pelt, PA  Consent: Verbal consent obtained. Risks and benefits: risks, benefits and alternatives were discussed Consent given by: patient Patient understanding: patient states understanding of the procedure being performed Patient consent: the patient's understanding of the procedure matches consent given Patient identity confirmed: verbally with patient Body area: skin General location: upper extremity Location details: right hand Anesthesia method: attempted local-pt screamed and withdrew immediately and requested no local.  Anesthesia: Local Anesthetic: lidocaine 1% without epinephrine Anesthetic total: 0 mL Removal mechanism: forceps Dressing: dressing applied Depth: subcutaneous Complexity: simple 1 objects recovered. Objects recovered: splinter Post-procedure assessment: foreign body removed Patient tolerance: patient tolerated the procedure well with no immediate complications      Medications Ordered in ED Medications  metroNIDAZOLE (FLAGYL) IVPB 500 mg (has no administration in time range)  vancomycin (VANCOREADY) IVPB 1750 mg/350 mL (has no administration in time range)  lidocaine (PF)  (XYLOCAINE) 1 % injection 5 mL (5 mLs Infiltration Given by Other 04/07/23 1954)  ceFEPIme (MAXIPIME) 2 g in sodium chloride 0.9 % 100 mL IVPB (2 g Intravenous New Bag/Given 04/07/23 1954)  acetaminophen (TYLENOL) tablet 1,000 mg (1,000 mg Oral Given 04/07/23 1934)  sodium chloride 0.9 % bolus 1,000 mL (1,000 mLs Intravenous New Bag/Given 04/07/23 1954)    ED Course/ Medical Decision Making/ A&P                               Medical Decision Making Patient is a 37 year old male, here for weakness, shortness of breath, fever, chills that has been going on for the last 3 days.  He has not been taking his Biktarvy for the last week and a half.  States  he just ran out.  Denies any abdominal pain, nausea, vomiting, diarrhea.  Also has complaint of a splinter in his hand.  Unknown last tetanus.  We remove the splinter from the patient's hand successfully.  Blood cultures, CBC, CMP, lactic acid, urinalysis as well as UDS given pinpoint pupils for patient.  Amount and/or Complexity of Data Reviewed Labs: ordered. Decision-making details documented in ED Course.    Details: Lactic acid 2.0, white blood cell count of 2.4, neutropenic 0.9 Radiology: ordered. Discussion of management or test interpretation with external provider(s): Discussed with patient and pharmacist, patient has a neutropenic fever, concern for HIV crisis, versus sepsis.  Discussed risk and benefits of Tdap, we will hold off on this time given the neutropenic fever, unwell status, and visible wooden splinter, additionally splinter was fairly superficial.  Will defer to hospitalist, for further discussion as patient improves.  Patient in agreement with plan.  Will require admission, secondary to concern for possible endorgan damage, versus HIV crisis secondary to noncompliance with Biktarvy.  CD4 count pending at this time. Admitted to Dr Margo Aye  Risk OTC drugs. Prescription drug management. Decision regarding  hospitalization.   CRITICAL CARE Performed by: Pete Pelt   Total critical care time: 35 minutes  Critical care time was exclusive of separately billable procedures and treating other patients.  Critical care was necessary to treat or prevent imminent or life-threatening deterioration.  Critical care was time spent personally by me on the following activities: development of treatment plan with patient and/or surrogate as well as nursing, discussions with consultants, evaluation of patient's response to treatment, examination of patient, obtaining history from patient or surrogate, ordering and performing treatments and interventions, ordering and review of laboratory studies, ordering and review of radiographic studies, pulse oximetry and re-evaluation of patient's condition.   Final Clinical Impression(s) / ED Diagnoses Final diagnoses:  Febrile neutropenia (HCC)  Symptomatic HIV infection (HCC)  SIRS (systemic inflammatory response syndrome) [facility])    Rx / DC Orders ED Discharge Orders     None         Kristofor Michalowski, Harley [place], PA 04/07/23 2100    Derwood Kaplan, MD 04/12/23 1955

## 2023-04-07 NOTE — Progress Notes (Signed)
ED Pharmacy Antibiotic Sign Off An antibiotic consult was received from an ED provider for vancomycin and cefepime per pharmacy dosing for sepsis. A chart review was completed to assess appropriateness.   The following one time order(s) were placed:  Vancomycin 1750mg  x1  Cefepime 2g x1   Further antibiotic and/or antibiotic pharmacy consults should be ordered by the admitting provider if indicated.   Thank you for allowing pharmacy to be a part of this patient's care.   Estill Batten, PharmD, BCCCP  Clinical Pharmacist 04/07/23 7:26 PM

## 2023-04-07 NOTE — ED Notes (Signed)
Pt refusing BP at this time.

## 2023-04-08 DIAGNOSIS — R5081 Fever presenting with conditions classified elsewhere: Secondary | ICD-10-CM | POA: Diagnosis not present

## 2023-04-08 DIAGNOSIS — D709 Neutropenia, unspecified: Secondary | ICD-10-CM | POA: Diagnosis not present

## 2023-04-08 DIAGNOSIS — B20 Human immunodeficiency virus [HIV] disease: Secondary | ICD-10-CM | POA: Diagnosis not present

## 2023-04-08 LAB — PHOSPHORUS: Phosphorus: 3.7 mg/dL (ref 2.5–4.6)

## 2023-04-08 LAB — CBC WITH DIFFERENTIAL/PLATELET
Abs Immature Granulocytes: 0 10*3/uL (ref 0.00–0.07)
Basophils Absolute: 0 10*3/uL (ref 0.0–0.1)
Basophils Relative: 2 %
Eosinophils Absolute: 0 10*3/uL (ref 0.0–0.5)
Eosinophils Relative: 2 %
HCT: 35 % — ABNORMAL LOW (ref 39.0–52.0)
Hemoglobin: 11.5 g/dL — ABNORMAL LOW (ref 13.0–17.0)
Lymphocytes Relative: 37 %
Lymphs Abs: 0.6 10*3/uL — ABNORMAL LOW (ref 0.7–4.0)
MCH: 32.3 pg (ref 26.0–34.0)
MCHC: 32.9 g/dL (ref 30.0–36.0)
MCV: 98.3 fL (ref 80.0–100.0)
Monocytes Absolute: 0.3 10*3/uL (ref 0.1–1.0)
Monocytes Relative: 19 %
Neutro Abs: 0.7 10*3/uL — ABNORMAL LOW (ref 1.7–7.7)
Neutrophils Relative %: 40 %
Platelets: 181 10*3/uL (ref 150–400)
RBC: 3.56 MIL/uL — ABNORMAL LOW (ref 4.22–5.81)
RDW: 12.5 % (ref 11.5–15.5)
WBC: 1.7 10*3/uL — ABNORMAL LOW (ref 4.0–10.5)
nRBC: 0 % (ref 0.0–0.2)
nRBC: 0 /100{WBCs}

## 2023-04-08 LAB — BASIC METABOLIC PANEL
Anion gap: 7 (ref 5–15)
BUN: 10 mg/dL (ref 6–20)
CO2: 20 mmol/L — ABNORMAL LOW (ref 22–32)
Calcium: 8.2 mg/dL — ABNORMAL LOW (ref 8.9–10.3)
Chloride: 106 mmol/L (ref 98–111)
Creatinine, Ser: 0.89 mg/dL (ref 0.61–1.24)
GFR, Estimated: >60 mL/min (ref >60)
Glucose, Bld: 116 mg/dL — ABNORMAL HIGH (ref 70–99)
Potassium: 4.5 mmol/L (ref 3.5–5.1)
Sodium: 133 mmol/L — ABNORMAL LOW (ref 135–145)

## 2023-04-08 LAB — PATHOLOGIST SMEAR REVIEW

## 2023-04-08 LAB — RESPIRATORY PANEL BY PCR

## 2023-04-08 LAB — T-HELPER CELLS (CD4) COUNT (NOT AT ARMC)
CD4 % Helper T Cell: 33 % (ref 33–65)
CD4 T Cell Abs: 284 /uL — ABNORMAL LOW (ref 400–1790)

## 2023-04-08 LAB — MAGNESIUM: Magnesium: 1.9 mg/dL (ref 1.7–2.4)

## 2023-04-08 MED ORDER — ENSURE ENLIVE PO LIQD
237.0000 mL | Freq: Three times a day (TID) | ORAL | Status: DC
  Administered 2023-04-08 – 2023-04-11 (×10): 237 mL via ORAL
  Filled 2023-04-08: qty 237

## 2023-04-08 MED ORDER — METRONIDAZOLE 500 MG/100ML IV SOLN
500.0000 mg | Freq: Two times a day (BID) | INTRAVENOUS | Status: DC
  Administered 2023-04-08: 500 mg via INTRAVENOUS
  Filled 2023-04-08: qty 100

## 2023-04-08 MED ORDER — SODIUM CHLORIDE 0.9 % IV SOLN
2.0000 g | Freq: Three times a day (TID) | INTRAVENOUS | Status: DC
  Administered 2023-04-08 – 2023-04-10 (×7): 2 g via INTRAVENOUS
  Filled 2023-04-08 (×8): qty 12.5

## 2023-04-08 MED ORDER — VANCOMYCIN HCL 1250 MG/250ML IV SOLN
1250.0000 mg | Freq: Two times a day (BID) | INTRAVENOUS | Status: DC
  Administered 2023-04-08 – 2023-04-09 (×3): 1250 mg via INTRAVENOUS
  Filled 2023-04-08 (×5): qty 250

## 2023-04-08 NOTE — Plan of Care (Signed)
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
## 2023-04-08 NOTE — Progress Notes (Signed)
Pharmacy Antibiotic Note  Jason Herman is a 37 y.o. male admitted on 04/07/2023 with  sepsis/fever .  Pharmacy has been consulted for Vancomycin/Cefepime dosing. Leukopenic. Renal function ok. Hx HIV on Biktarvy.   Plan: Vancomycin 1750 mg IV x 1, then 1250 mg IV q12h >>>Estimated AUC: 476 Cefepime 2g IV q8h Trend WBC, temp, renal function  F/U infectious work-up Drug levels as indicated   Height: 5\' 11"  (180.3 cm) Weight: 86.2 kg (190 lb) IBW/kg (Calculated) : 75.3  Temp (24hrs), Avg:101.8 F (38.8 C), Min:99.6 F (37.6 C), Max:102.9 F (39.4 C)  Recent Labs  Lab 04/07/23 1830 04/07/23 1836 04/07/23 2040  WBC 2.4*  --   --   CREATININE 1.10  --   --   LATICACIDVEN  --  2.0* 0.8    Estimated Creatinine Clearance: 97 mL/min (by C-G formula based on SCr of 1.1 mg/dL).    No Known Allergies  Abran Duke, PharmD, BCPS Clinical Pharmacist Phone: [phone]

## 2023-04-08 NOTE — ED Notes (Signed)
Pt call bell going off.  Upon entering room patient is lying on his stomach in bed and talking out loud and cussing this RN.  When attempting to ask patient what's wrong and why he's upset patient gets more angry.  Pt saying he wants a different nurse, "why am I on this floor still, yall must being doing something wrong if I'm still here", "I want to be discharged".  This RN very confused at the animosity and calmly trying to ask patient what this RN has done wrong or what can be done to try and help patient.  Patient more loudly states "just get the fuck out, I didn't call you in here, don't come back".  This RN left the room per patient's demand and charge RN notified.  Call bell in place for patient.

## 2023-04-08 NOTE — ED Notes (Signed)
Pt given sandwich bag and milk

## 2023-04-08 NOTE — Progress Notes (Signed)
PROGRESS NOTE  Jason Herman ZOX:[id number] DOB: Feb 23, 1986 DOA: 04/07/2023 PCP: Patient, No Pcp Per   LOS: 1 day   Brief Narrative / Interim history: 37 year old male with HIV, history of medication nonadherence, comes to the hospital with fever, chills, shortness of breath for the past 3 days.  He tells me he ran out of his HIV meds about a week and a half ago.  He was found to be neutropenic in the ED, febrile, was placed on antibiotics and admitted to the hospital.  No clear source.  Subjective / 24h Interval events: He reports ongoing symptoms, does not feel too good.  Assesement and Plan: Principal Problem:   Febrile neutropenia (HCC)  Principal problem SIRS, fever in the setting of neutropenia -patient was placed on antibiotics.  ID consulted this morning, appreciate input. -Blood culture sent on admission, monitor -COVID, flu, RSV all negative -Chest x-ray unremarkable  Active problems HIV, history of nonadherence -obtain viral load and CD4 count.  ID consulted  Hyponatremia -mild, sodium stable  Anemia, normocytic -check anemia panel  History of schizophrenia-continue Zyprexa  Scheduled Meds:  bictegravir-emtricitabine-tenofovir AF  1 tablet Oral Daily   enoxaparin (LOVENOX) injection  40 mg Subcutaneous Q24H   feeding supplement  237 mL Oral TID BM   OLANZapine  5 mg Oral QHS   sulfamethoxazole-trimethoprim  1 tablet Oral Q M,W,F   Continuous Infusions:  sodium chloride 75 mL/hr at 04/08/23 0022   ceFEPime (MAXIPIME) IV Stopped (04/08/23 0904)   metronidazole 500 mg (04/08/23 1009)   vancomycin 1,250 mg (04/08/23 1012)   PRN Meds:.acetaminophen, melatonin, oxyCODONE, polyethylene glycol, prochlorperazine  Current Outpatient Medications  Medication Instructions   bictegravir-emtricitabine-tenofovir AF (BIKTARVY) 50-200-25 MG TABS tablet 1 tablet, Oral, Daily, PLEASE CALL [phone] TO MAKE AN APPOINTMENT   naproxen (NAPROSYN) 500 mg, Oral, 2 times daily  with meals   OLANZapine (ZYPREXA) 5 MG tablet TAKE 1 TABLET(5 MG) BY MOUTH AT BEDTIME   sulfamethoxazole-trimethoprim (BACTRIM DS) 800-160 MG tablet 1 tablet, Oral, 3 times weekly    Diet Orders (From admission, onward)     Start     Ordered   04/07/23 2330  Diet regular Room service appropriate? Yes; Fluid consistency: Thin  Diet effective now       Question Answer Comment  Room service appropriate? Yes   Fluid consistency: Thin      04/07/23 2329            DVT prophylaxis: enoxaparin (LOVENOX) injection 40 mg Start: 04/08/23 1000   Lab Results  Component Value Date   PLT 181 04/08/2023      Code Status: Full Code  Family Communication: no family at bedside   Status is: Inpatient Remains inpatient appropriate because: severity of illness  Level of care: Telemetry Medical  Consultants:  ID  Objective: Vitals:   04/08/23 0930 04/08/23 1000 04/08/23 1015 04/08/23 1052  BP:  108/72    Pulse: 91 90 94   Resp: 13 17 (!) 21   Temp:    99.9 F (37.7 C)  TempSrc:    Oral  SpO2: 96% 100% 100%   Weight:      Height:        Intake/Output Summary (Last 24 hours) at 04/08/2023 1055 Last data filed at 04/08/2023 0904 Gross per 24 hour  Intake 1644.23 ml  Output 700 ml  Net 944.23 ml   Wt Readings from Last 3 Encounters:  04/07/23 86.2 kg  01/22/23 86.2 kg  05/23/22 86.2 kg  Examination:  Constitutional: NAD Eyes: no scleral icterus ENMT: Mucous membranes are moist.  Neck: normal, supple Respiratory: clear to auscultation bilaterally, no wheezing, no crackles. Normal respiratory effort. Cardiovascular: Regular rate and rhythm, no murmurs / rubs / gallops Abdomen: non distended, no tenderness. Bowel sounds positive.  Musculoskeletal: no clubbing / cyanosis.   Data Reviewed: I have independently reviewed following labs and imaging studies   CBC Recent Labs  Lab 04/07/23 1830 04/08/23 0744  WBC 2.4* 1.7*  HGB 12.4* 11.5*  HCT 37.9* 35.0*  PLT  215 181  MCV 99.5 98.3  MCH 32.5 32.3  MCHC 32.7 32.9  RDW 12.6 12.5  LYMPHSABS 1.2 0.6*  MONOABS 0.3 0.3  EOSABS 0.0 0.0  BASOSABS 0.0 0.0    Recent Labs  Lab 04/07/23 1830 04/07/23 1836 04/07/23 2040 04/08/23 0744  NA 132*  --   --  133*  K 3.9  --   --  4.5  CL 101  --   --  106  CO2 20*  --   --  20*  GLUCOSE 94  --   --  116*  BUN 10  --   --  10  CREATININE 1.10  --   --  0.89  CALCIUM 8.8*  --   --  8.2*  AST 22  --   --   --   ALT 14  --   --   --   ALKPHOS 71  --   --   --   BILITOT 0.7  --   --   --   ALBUMIN 3.5  --   --   --   MG  --   --   --  1.9  LATICACIDVEN  --  2.0* 0.8  --   INR 0.9  --   --   --     ------------------------------------------------------------------------------------------------------------------ No results for input(s): "CHOL", "HDL", "LDLCALC", "TRIG", "CHOLHDL", "LDLDIRECT" in the last 72 hours.  No results found for: "HGBA1C" ------------------------------------------------------------------------------------------------------------------ No results for input(s): "TSH", "T4TOTAL", "T3FREE", "THYROIDAB" in the last 72 hours.  Invalid input(s): "FREET3"  Cardiac Enzymes No results for input(s): "CKMB", "TROPONINI", "MYOGLOBIN" in the last 168 hours.  Invalid input(s): "CK" ------------------------------------------------------------------------------------------------------------------ No results found for: "BNP"  CBG: No results for input(s): "GLUCAP" in the last 168 hours.  Recent Results (from the past 240 hour(s))  Culture, blood (Routine x 2)     Status: None (Preliminary result)   Collection Time: 04/07/23  6:25 PM   Specimen: BLOOD  Result Value Ref Range Status   Specimen Description BLOOD LEFT ANTECUBITAL  Final   Special Requests   Final    BOTTLES DRAWN AEROBIC AND ANAEROBIC Blood Culture adequate volume   Culture   Final    NO GROWTH < 12 HOURS Performed at [facility], [address],  [place], [place] [address]    Report Status PENDING  Incomplete  Resp panel by RT-PCR (RSV, Flu A&B, Covid) Anterior Nasal Swab     Status: None   Collection Time: 04/07/23  6:26 PM   Specimen: Anterior Nasal Swab  Result Value Ref Range Status   SARS Coronavirus 2 by RT PCR NEGATIVE NEGATIVE Final   Influenza A by PCR NEGATIVE NEGATIVE Final   Influenza B by PCR NEGATIVE NEGATIVE Final    Comment: (NOTE) The Xpert Xpress SARS-CoV-2/FLU/RSV plus assay is intended as an aid in the diagnosis of influenza from Nasopharyngeal swab specimens and should not be used as a sole  basis for treatment. Nasal washings and aspirates are unacceptable for Xpert Xpress SARS-CoV-2/FLU/RSV testing.  Fact Sheet for Patients: BloggerCourse.com  Fact Sheet for Healthcare Providers: SeriousBroker.it  This test is not yet approved or cleared by the [place] FDA and has been authorized for detection and/or diagnosis of SARS-CoV-2 by FDA under an Emergency Use Authorization (EUA). This EUA will remain in effect (meaning this test can be used) for the duration of the COVID-19 declaration under Section 564(b)(1) of the Act, 21 U.S.C. section 360bbb-3(b)(1), unless the authorization is terminated or revoked.     Resp Syncytial Virus by PCR NEGATIVE NEGATIVE Final    Comment: (NOTE) Fact Sheet for Patients: BloggerCourse.com  Fact Sheet for Healthcare Providers: SeriousBroker.it  This test is not yet approved or cleared by the [place] FDA and has been authorized for detection and/or diagnosis of SARS-CoV-2 by FDA under an Emergency Use Authorization (EUA). This EUA will remain in effect (meaning this test can be used) for the duration of the COVID-19 declaration under Section 564(b)(1) of the Act, 21 U.S.C. section 360bbb-3(b)(1), unless the authorization is terminated or revoked.  Performed at  [facility], [address], [place], [place] [address]   Culture, blood (Routine x 2)     Status: None (Preliminary result)   Collection Time: 04/07/23  6:30 PM   Specimen: BLOOD RIGHT HAND  Result Value Ref Range Status   Specimen Description BLOOD RIGHT HAND  Final   Special Requests   Final    BOTTLES DRAWN AEROBIC AND ANAEROBIC Blood Culture adequate volume   Culture   Final    NO GROWTH < 12 HOURS Performed at [facility], [address] [place], [place] [address]    Report Status PENDING  Incomplete     Radiology Studies: DG Chest 2 View  Result Date: 04/07/2023 CLINICAL DATA:  Sepsis EXAM: CHEST - 2 VIEW COMPARISON:  None Available. FINDINGS: The heart size and mediastinal contours are within normal limits. Both lungs are clear. The visualized skeletal structures are unremarkable. IMPRESSION: No active cardiopulmonary disease. Electronically Signed   By: Helyn Numbers M.D.   On: 04/07/2023 20:25     Pamella Pert, MD, PhD [facility]  Between 7 am - 7 pm I am available, please contact me via Amion (for emergencies) or Securechat (non urgent messages)  Between 7 pm - 7 am I am not available, please contact night coverage MD/APP via Amion

## 2023-04-08 NOTE — ED Notes (Signed)
Pt refused blood draw, nurse notified.  KM

## 2023-04-09 ENCOUNTER — Ambulatory Visit: Payer: Managed Care, Other (non HMO) | Admitting: Pharmacist

## 2023-04-09 ENCOUNTER — Other Ambulatory Visit (HOSPITAL_COMMUNITY): Payer: Self-pay

## 2023-04-09 ENCOUNTER — Ambulatory Visit: Payer: Managed Care, Other (non HMO)

## 2023-04-09 DIAGNOSIS — B20 Human immunodeficiency virus [HIV] disease: Secondary | ICD-10-CM | POA: Diagnosis not present

## 2023-04-09 DIAGNOSIS — R5081 Fever presenting with conditions classified elsewhere: Secondary | ICD-10-CM | POA: Diagnosis not present

## 2023-04-09 DIAGNOSIS — D709 Neutropenia, unspecified: Secondary | ICD-10-CM | POA: Diagnosis not present

## 2023-04-09 LAB — CBC WITH DIFFERENTIAL/PLATELET
Abs Immature Granulocytes: 0.01 10*3/uL (ref 0.00–0.07)
Basophils Absolute: 0 10*3/uL (ref 0.0–0.1)
Basophils Relative: 1 %
Eosinophils Absolute: 0 10*3/uL (ref 0.0–0.5)
Eosinophils Relative: 1 %
HCT: 39.4 % (ref 39.0–52.0)
Hemoglobin: 12.9 g/dL — ABNORMAL LOW (ref 13.0–17.0)
Immature Granulocytes: 1 %
Lymphocytes Relative: 55 %
Lymphs Abs: 0.7 10*3/uL (ref 0.7–4.0)
MCH: 33.2 pg (ref 26.0–34.0)
MCHC: 32.7 g/dL (ref 30.0–36.0)
MCV: 101.3 fL — ABNORMAL HIGH (ref 80.0–100.0)
Monocytes Absolute: 0.1 10*3/uL (ref 0.1–1.0)
Monocytes Relative: 11 %
Neutro Abs: 0.4 10*3/uL — CL (ref 1.7–7.7)
Neutrophils Relative %: 31 %
Platelets: 194 10*3/uL (ref 150–400)
RBC: 3.89 MIL/uL — ABNORMAL LOW (ref 4.22–5.81)
RDW: 12.4 % (ref 11.5–15.5)
WBC: 1.3 10*3/uL — CL (ref 4.0–10.5)
nRBC: 0 % (ref 0.0–0.2)

## 2023-04-09 LAB — HEPATITIS PANEL, ACUTE
HCV Ab: NONREACTIVE
Hep A IgM: NONREACTIVE
Hep B C IgM: NONREACTIVE
Hepatitis B Surface Ag: NONREACTIVE

## 2023-04-09 LAB — VITAMIN B12: Vitamin B-12: 191 pg/mL (ref 180–914)

## 2023-04-09 LAB — COMPREHENSIVE METABOLIC PANEL
ALT: 13 U/L (ref 0–44)
AST: 15 U/L (ref 15–41)
Albumin: 3.5 g/dL (ref 3.5–5.0)
Alkaline Phosphatase: 70 U/L (ref 38–126)
Anion gap: 8 (ref 5–15)
BUN: 14 mg/dL (ref 6–20)
CO2: 22 mmol/L (ref 22–32)
Calcium: 9.3 mg/dL (ref 8.9–10.3)
Chloride: 106 mmol/L (ref 98–111)
Creatinine, Ser: 0.93 mg/dL (ref 0.61–1.24)
GFR, Estimated: >60 mL/min (ref >60)
Glucose, Bld: 103 mg/dL — ABNORMAL HIGH (ref 70–99)
Potassium: 4.5 mmol/L (ref 3.5–5.1)
Sodium: 136 mmol/L (ref 135–145)
Total Bilirubin: 0.7 mg/dL (ref 0.3–1.2)
Total Protein: 7.4 g/dL (ref 6.5–8.1)

## 2023-04-09 LAB — MAGNESIUM: Magnesium: 2.1 mg/dL (ref 1.7–2.4)

## 2023-04-09 LAB — RETICULOCYTES
Immature Retic Fract: 4.2 % (ref 2.3–15.9)
RBC.: 3.8 MIL/uL — ABNORMAL LOW (ref 4.22–5.81)
Retic Count, Absolute: 30.8 10*3/uL (ref 19.0–186.0)
Retic Ct Pct: 0.8 % (ref 0.4–3.1)

## 2023-04-09 LAB — IRON AND TIBC
Iron: 75 ug/dL (ref 45–182)
Saturation Ratios: 24 % (ref 17.9–39.5)
TIBC: 312 ug/dL (ref 250–450)
UIBC: 237 ug/dL

## 2023-04-09 LAB — FERRITIN: Ferritin: 427 ng/mL — ABNORMAL HIGH (ref 24–336)

## 2023-04-09 LAB — FOLATE: Folate: 11.5 ng/mL (ref >5.9)

## 2023-04-09 NOTE — Progress Notes (Signed)
PROGRESS NOTE    Jason Herman  VOZ:[id number] DOB: 01-15-1986 DOA: 04/07/2023 PCP: Patient, No Pcp Per   Brief Narrative:  37 year old male with HIV, history of medication nonadherence, comes to the hospital with fever, chills, shortness of breath for the past 3 days. He tells me he ran out of his HIV meds about a week and a half ago. He was found to be neutropenic in the ED, febrile, was placed on antibiotics and admitted to the hospital. No clear source.   Assessment & Plan:   Principal Problem:   Febrile neutropenia (HCC)  SIRS, fever in the setting of neutropenia -patient was placed on antibiotics.  ID consulted, extensive workup in progress.  Cultures negative so far.  COVID, flu, RSV and chest x-ray as well as UA negative.  On broad-spectrum antibiotics managed by ID.  Has remained afebrile for more than 24 hours now.  Last temperature spike of 101.9 at around 9 PM on 04/07/2023.   HIV, history of nonadherence/noncompliance with-obtain viral load and CD4 count.  ID consulted   Hyponatremia -resolved.   Anemia, normocytic -check anemia panel   History of schizophrenia-continue Zyprexa  DVT prophylaxis: enoxaparin (LOVENOX) injection 40 mg Start: 04/08/23 1000   Code Status: Full Code  Family Communication:  None present at bedside.  Plan of care discussed with patient in length and he/she verbalized understanding and agreed with it.  Status is: Inpatient Remains inpatient appropriate because: Waiting for final culture report.  Will be discharged when cleared by ID.   Estimated body mass index is 26.5 kg/m as calculated from the following:   Height as of this encounter: 5\' 11"  (1.803 m).   Weight as of this encounter: 86.2 kg.    Nutritional Assessment: Body mass index is 26.5 kg/m.Marland Kitchen Seen by dietician.  I agree with the assessment and plan as outlined below: Nutrition Status:        . Skin Assessment: I have examined the patient's skin and I agree with the  wound assessment as performed by the wound care RN as outlined below:    Consultants:  ID  Procedures:  None  Antimicrobials:  Anti-infectives (From admission, onward)    Start     Dose/Rate Route Frequency Ordered Stop   04/08/23 1000  bictegravir-emtricitabine-tenofovir AF (BIKTARVY) 50-200-25 MG per tablet 1 tablet        1 tablet Oral Daily 04/07/23 2329     04/08/23 1000  sulfamethoxazole-trimethoprim (BACTRIM DS) 800-160 MG per tablet 1 tablet  Status:  Discontinued        1 tablet Oral Every M-W-F 04/07/23 2329 04/08/23 1641   04/08/23 1000  metroNIDAZOLE (FLAGYL) IVPB 500 mg  Status:  Discontinued        500 mg 100 mL/hr over 60 Minutes Intravenous Every 12 hours 04/08/23 0512 04/08/23 1641   04/08/23 1000  vancomycin (VANCOREADY) IVPB 1250 mg/250 mL        1,250 mg 166.7 mL/hr over 90 Minutes Intravenous Every 12 hours 04/08/23 0518     04/08/23 0600  ceFEPIme (MAXIPIME) 2 g in sodium chloride 0.9 % 100 mL IVPB        2 g 200 mL/hr over 30 Minutes Intravenous Every 8 hours 04/08/23 0517     04/07/23 1930  ceFEPIme (MAXIPIME) 2 g in sodium chloride 0.9 % 100 mL IVPB        2 g 200 mL/hr over 30 Minutes Intravenous  Once 04/07/23 1924 04/07/23 2023   04/07/23 1930  metroNIDAZOLE (FLAGYL) IVPB 500 mg        500 mg 100 mL/hr over 60 Minutes Intravenous  Once 04/07/23 1924 04/07/23 2132   04/07/23 1930  vancomycin (VANCOCIN) IVPB 1000 mg/200 mL premix  Status:  Discontinued        1,000 mg 200 mL/hr over 60 Minutes Intravenous  Once 04/07/23 1924 04/07/23 1926   04/07/23 1930  vancomycin (VANCOREADY) IVPB 1750 mg/350 mL        1,750 mg 175 mL/hr over 120 Minutes Intravenous  Once 04/07/23 1926 04/07/23 2243         Subjective: Patient seen and examined.  He has no complaints.  Objective: Vitals:   04/08/23 1717 04/08/23 2007 04/09/23 0019 04/09/23 0511  BP: 114/82 113/73 105/80 109/70  Pulse: 95 88 82 92  Resp: 17 17 18 18   Temp: 99.4 F (37.4 C) 99.8 F (37.7  C) 100.2 F (37.9 C) 98.8 F (37.1 C)  TempSrc: Oral Oral Oral Oral  SpO2: 96% 97% 100% 100%  Weight:      Height:        Intake/Output Summary (Last 24 hours) at 04/09/2023 1252 Last data filed at 04/09/2023 0616 Gross per 24 hour  Intake 1773.82 ml  Output 1750 ml  Net 23.82 ml   Filed Weights   04/07/23 1804  Weight: 86.2 kg    Examination:  General exam: Appears calm and comfortable  Respiratory system: Clear to auscultation. Respiratory effort normal. Cardiovascular system: S1 & S2 heard, RRR. No JVD, murmurs, rubs, gallops or clicks. No pedal edema. Gastrointestinal system: Abdomen is nondistended, soft and nontender. No organomegaly or masses felt. Normal bowel sounds heard. Central nervous system: Alert and oriented. No focal neurological deficits. Extremities: Symmetric 5 x 5 power. Skin: No rashes, lesions or ulcers Psychiatry: Judgement and insight appear normal. Mood & affect appropriate.    Data Reviewed: I have personally reviewed following labs and imaging studies  CBC: Recent Labs  Lab 04/07/23 1830 04/08/23 0744 04/09/23 0603  WBC 2.4* 1.7* 1.3*  NEUTROABS 0.9* 0.7* 0.4*  HGB 12.4* 11.5* 12.9*  HCT 37.9* 35.0* 39.4  MCV 99.5 98.3 101.3*  PLT 215 181 194   Basic Metabolic Panel: Recent Labs  Lab 04/07/23 1830 04/08/23 0744 04/09/23 0603  NA 132* 133* 136  K 3.9 4.5 4.5  CL 101 106 106  CO2 20* 20* 22  GLUCOSE 94 116* 103*  BUN 10 10 14   CREATININE 1.10 0.89 0.93  CALCIUM 8.8* 8.2* 9.3  MG  --  1.9 2.1  PHOS  --  3.7  --    GFR: Estimated Creatinine Clearance: 114.7 mL/min (by C-G formula based on SCr of 0.93 mg/dL). Liver Function Tests: Recent Labs  Lab 04/07/23 1830 04/09/23 0603  AST 22 15  ALT 14 13  ALKPHOS 71 70  BILITOT 0.7 0.7  PROT 7.2 7.4  ALBUMIN 3.5 3.5   No results for input(s): "LIPASE", "AMYLASE" in the last 168 hours. No results for input(s): "AMMONIA" in the last 168 hours. Coagulation Profile: Recent  Labs  Lab 04/07/23 1830  INR 0.9   Cardiac Enzymes: No results for input(s): "CKTOTAL", "CKMB", "CKMBINDEX", "TROPONINI" in the last 168 hours. BNP (last 3 results) No results for input(s): "PROBNP" in the last 8760 hours. HbA1C: No results for input(s): "HGBA1C" in the last 72 hours. CBG: No results for input(s): "GLUCAP" in the last 168 hours. Lipid Profile: No results for input(s): "CHOL", "HDL", "LDLCALC", "TRIG", "CHOLHDL", "LDLDIRECT" in the last  72 hours. Thyroid Function Tests: No results for input(s): "TSH", "T4TOTAL", "FREET4", "T3FREE", "THYROIDAB" in the last 72 hours. Anemia Panel: Recent Labs    04/09/23 0603  VITAMINB12 191  FOLATE 11.5  FERRITIN 427*  TIBC 312  IRON 75  RETICCTPCT 0.8   Sepsis Labs: Recent Labs  Lab 04/07/23 1836 04/07/23 2040  LATICACIDVEN 2.0* 0.8    Recent Results (from the past 240 hour(s))  Culture, blood (Routine x 2)     Status: None (Preliminary result)   Collection Time: 04/07/23  6:25 PM   Specimen: BLOOD  Result Value Ref Range Status   Specimen Description BLOOD LEFT ANTECUBITAL  Final   Special Requests   Final    BOTTLES DRAWN AEROBIC AND ANAEROBIC Blood Culture adequate volume   Culture   Final    NO GROWTH 2 DAYS Performed at [facility], [address], [place], [place] [address]    Report Status PENDING  Incomplete  Resp panel by RT-PCR (RSV, Flu A&B, Covid) Anterior Nasal Swab     Status: None   Collection Time: 04/07/23  6:26 PM   Specimen: Anterior Nasal Swab  Result Value Ref Range Status   SARS Coronavirus 2 by RT PCR NEGATIVE NEGATIVE Final   Influenza A by PCR NEGATIVE NEGATIVE Final   Influenza B by PCR NEGATIVE NEGATIVE Final    Comment: (NOTE) The Xpert Xpress SARS-CoV-2/FLU/RSV plus assay is intended as an aid in the diagnosis of influenza from Nasopharyngeal swab specimens and should not be used as a sole basis for treatment. Nasal washings and aspirates are unacceptable for Xpert Xpress  SARS-CoV-2/FLU/RSV testing.  Fact Sheet for Patients: BloggerCourse.com  Fact Sheet for Healthcare Providers: SeriousBroker.it  This test is not yet approved or cleared by the [place] FDA and has been authorized for detection and/or diagnosis of SARS-CoV-2 by FDA under an Emergency Use Authorization (EUA). This EUA will remain in effect (meaning this test can be used) for the duration of the COVID-19 declaration under Section 564(b)(1) of the Act, 21 U.S.C. section 360bbb-3(b)(1), unless the authorization is terminated or revoked.     Resp Syncytial Virus by PCR NEGATIVE NEGATIVE Final    Comment: (NOTE) Fact Sheet for Patients: BloggerCourse.com  Fact Sheet for Healthcare Providers: SeriousBroker.it  This test is not yet approved or cleared by the [place] FDA and has been authorized for detection and/or diagnosis of SARS-CoV-2 by FDA under an Emergency Use Authorization (EUA). This EUA will remain in effect (meaning this test can be used) for the duration of the COVID-19 declaration under Section 564(b)(1) of the Act, 21 U.S.C. section 360bbb-3(b)(1), unless the authorization is terminated or revoked.  Performed at [facility], [address] [place], [place] [address]   Culture, blood (Routine x 2)     Status: None (Preliminary result)   Collection Time: 04/07/23  6:30 PM   Specimen: BLOOD RIGHT HAND  Result Value Ref Range Status   Specimen Description BLOOD RIGHT HAND  Final   Special Requests   Final    BOTTLES DRAWN AEROBIC AND ANAEROBIC Blood Culture adequate volume   Culture   Final    NO GROWTH 2 DAYS Performed at [facility], [address], [place], [place] [address]    Report Status PENDING  Incomplete  Respiratory (~20 pathogens) panel by PCR     Status: None   Collection Time: 04/08/23  4:42 PM   Specimen: Nasopharyngeal Swab;  Respiratory  Result  Value Ref Range Status   Adenovirus NOT DETECTED NOT DETECTED Final   Coronavirus 229E NOT DETECTED NOT DETECTED Final    Comment: (NOTE) The Coronavirus on the Respiratory Panel, DOES NOT test for the novel  Coronavirus (2019 nCoV)    Coronavirus HKU1 NOT DETECTED NOT DETECTED Final   Coronavirus NL63 NOT DETECTED NOT DETECTED Final   Coronavirus OC43 NOT DETECTED NOT DETECTED Final   Metapneumovirus NOT DETECTED NOT DETECTED Final   Rhinovirus / Enterovirus NOT DETECTED NOT DETECTED Final   Influenza A NOT DETECTED NOT DETECTED Final   Influenza B NOT DETECTED NOT DETECTED Final   Parainfluenza Virus 1 NOT DETECTED NOT DETECTED Final   Parainfluenza Virus 2 NOT DETECTED NOT DETECTED Final   Parainfluenza Virus 3 NOT DETECTED NOT DETECTED Final   Parainfluenza Virus 4 NOT DETECTED NOT DETECTED Final   Respiratory Syncytial Virus NOT DETECTED NOT DETECTED Final   Bordetella pertussis NOT DETECTED NOT DETECTED Final   Bordetella Parapertussis NOT DETECTED NOT DETECTED Final   Chlamydophila pneumoniae NOT DETECTED NOT DETECTED Final   Mycoplasma pneumoniae NOT DETECTED NOT DETECTED Final    Comment: Performed at [facility], [address], [place], [place] [address]     Radiology Studies: DG Chest 2 View  Result Date: 04/07/2023 CLINICAL DATA:  Sepsis EXAM: CHEST - 2 VIEW COMPARISON:  None Available. FINDINGS: The heart size and mediastinal contours are within normal limits. Both lungs are clear. The visualized skeletal structures are unremarkable. IMPRESSION: No active cardiopulmonary disease. Electronically Signed   By: Helyn Numbers M.D.   On: 04/07/2023 20:25    Scheduled Meds:  bictegravir-emtricitabine-tenofovir AF  1 tablet Oral Daily   enoxaparin (LOVENOX) injection  40 mg Subcutaneous Q24H   feeding supplement  237 mL Oral TID BM   OLANZapine  5 mg Oral QHS   Continuous Infusions:  ceFEPime (MAXIPIME) IV 2 g (04/09/23 5784)   vancomycin  1,250 mg (04/09/23 1025)     LOS: 2 days   Hughie Closs, MD [facility]  04/09/2023, 12:52 PM   *Please note that this is a verbal dictation therefore any spelling or grammatical errors are due to the "Dragon Medical One" system interpretation.  Please page via Amion and do not message via secure chat for urgent patient care matters. Secure chat can be used for non urgent patient care matters.  How to contact the [facility] Attending or Consulting provider 7A - 7P or covering provider during after hours 7P -7A, for this patient?  Check the care team in [facility] and look for a) attending/consulting [facility] provider listed and b) the [facility] team listed. Page or secure chat 7A-7P. Log into [email or web address] and use [place] universal password to access. If you do not have the password, please contact the hospital operator. Locate the [facility] provider you are looking for under [facility] and page to a number that you can be directly reached. If you still have difficulty reaching the provider, please page the [facility] (Director on Call) for the Hospitalists listed on amion for assistance.

## 2023-04-09 NOTE — Progress Notes (Signed)
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, aids, here with febrile syndrome        #febrile syndrome #neutropenic  No localizing sx 3 days nonspecific flu like illness including dyspnea  Cxr clear Neutropenia is mild Normal lft on presentation; no diarrhea; no thrombocytopenia Flu/rsv/covid pcr negative  10/20 bcx ngtd   Suspect a viral syndrome and will send full respiratory viral pcr >>> bacterial infection  Will continue to trend lft for the next few days - I do not suspect hepatitis panel Do not suspect tick related disease   He does have relative hypotension and reasonable to keep empiric abx for now   04/09/23 respiratory viral pcr negative; wbc continues to trend down; differential showed reactive lymphocytes; lft remains same. Will broaden ddx to include parvovirus, hepatitis, cmv/ebv infection. 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; denies msm/ivdu risk; presumed heterosexual transmission Seen at [facility] last seen 03/2022 03/2022 viral load 47 03/2023 cd4 284 (33%)   Well controlled previously and cd4 percentage this admission is stable compated to late 2023 when he was virologically controlled   Previously was taking biktarvy and said he ran out for about a couple weeks   As he had maintained at least a year on bactrim prophy and 6 months beyond that with good cd4 level, will stop bactrim prophy           Plan: As bcx remains negative, will peel off vancomycin today; continue cefepime (stop tomorrow if negative) Sent cmv, ebv, parvovirus, acute hepatitis panel Send hiv pcr tomorrow to monitor his Art compliance Discussed with primary team    Principal  Problem:   Febrile neutropenia (HCC)   No Known Allergies  Scheduled Meds:  bictegravir-emtricitabine-tenofovir AF  1 tablet Oral Daily   enoxaparin (LOVENOX) injection  40 mg Subcutaneous Q24H   feeding supplement  237 mL Oral TID BM   OLANZapine  5 mg Oral QHS   Continuous Infusions:  ceFEPime (MAXIPIME) IV 2 g (04/09/23 1327)   vancomycin 1,250 mg (04/09/23 1025)   PRN Meds:.acetaminophen, melatonin, oxyCODONE, polyethylene glycol, prochlorperazine   SUBJECTIVE: Feels the same -- poor appetite, malaise, dry cough, dyspnea. No rash, n/v/diarrhea, focal joint/muscle pain Wbc down a little more Afebrile Respiratory viral pcr negative  Review of Systems: ROS All other ROS was negative, except mentioned above     OBJECTIVE: Vitals:   04/08/23 2007 04/09/23 0019 04/09/23 0511 04/09/23 1428  BP: 113/73 105/80 109/70 116/77  Pulse: 88 82 92 (!) 105  Resp: 17 18 18    Temp: 99.8 F (37.7 C) 100.2 F (37.9 C) 98.8 F (37.1 C) 98.4 F (36.9 C)  TempSrc: Oral Oral Oral Oral  SpO2: 97% 100% 100% 100%  Weight:      Height:       Body mass index is 26.5 kg/m.  Physical Exam General/constitutional: no distress, pleasant HEENT: Normocephalic, PER, Conj Clear, EOMI, missing teeth Neck supple CV: rrr no mrg Lungs: clear to auscultation, normal respiratory effort Abd: Soft, Nontender Ext: no edema Skin: No Rash Neuro: nonfocal MSK: no peripheral joint swelling/tenderness/warmth; back spines nontender    Lab Results Lab Results  Component Value Date   WBC 1.3 (LL) 04/09/2023   HGB 12.9 (L) 04/09/2023   HCT 39.4 04/09/2023   MCV 101.3 (H) 04/09/2023   PLT 194 04/09/2023    Lab Results  Component Value Date   CREATININE 0.93 04/09/2023   BUN 14 04/09/2023   NA 136 04/09/2023   K 4.5 04/09/2023   CL 106 04/09/2023   CO2 22 04/09/2023    Lab Results  Component Value Date   ALT 13 04/09/2023   AST 15 04/09/2023   ALKPHOS 70 04/09/2023   BILITOT 0.7  04/09/2023      Microbiology: Recent Results (from the past 240 hour(s))  Culture, blood (Routine x 2)     Status: None (Preliminary result)   Collection Time: 04/07/23  6:25 PM   Specimen: BLOOD  Result Value Ref Range Status   Specimen Description BLOOD LEFT ANTECUBITAL  Final   Special Requests   Final    BOTTLES DRAWN AEROBIC AND ANAEROBIC Blood Culture adequate volume   Culture   Final    NO GROWTH 2 DAYS Performed at [facility], [address], [place], [place] [address]    Report Status PENDING  Incomplete  Resp panel by RT-PCR (RSV, Flu A&B, Covid) Anterior Nasal Swab     Status: None   Collection Time: 04/07/23  6:26 PM   Specimen: Anterior Nasal Swab  Result Value Ref Range Status   SARS Coronavirus 2 by RT PCR NEGATIVE NEGATIVE Final   Influenza A by PCR NEGATIVE NEGATIVE Final   Influenza B by PCR NEGATIVE NEGATIVE Final    Comment: (NOTE) The Xpert Xpress SARS-CoV-2/FLU/RSV plus assay is intended as an aid in the diagnosis of influenza from Nasopharyngeal swab specimens and should not be used as a sole basis for treatment. Nasal washings and aspirates are unacceptable for Xpert Xpress SARS-CoV-2/FLU/RSV testing.  Fact Sheet for Patients: BloggerCourse.com  Fact Sheet for Healthcare Providers: SeriousBroker.it  This test is not yet approved or cleared by the [place] FDA and has been authorized for detection and/or diagnosis of SARS-CoV-2 by FDA under an Emergency Use Authorization (EUA). This EUA will remain in effect (meaning this test can be used) for the duration of the COVID-19 declaration under Section 564(b)(1) of the Act, 21 U.S.C. section 360bbb-3(b)(1), unless the authorization is terminated or revoked.     Resp Syncytial Virus by PCR NEGATIVE NEGATIVE Final    Comment: (NOTE) Fact Sheet for Patients: BloggerCourse.com  Fact Sheet for Healthcare  Providers: SeriousBroker.it  This test is not yet approved or cleared by the [place] FDA and has been authorized for detection and/or diagnosis of SARS-CoV-2 by FDA under an Emergency Use Authorization (EUA). This EUA will remain in effect (meaning this test can be used) for the duration of the COVID-19 declaration under Section 564(b)(1) of the Act, 21 U.S.C. section 360bbb-3(b)(1), unless the authorization is terminated or revoked.  Performed at [facility], [address], [place], [place] [address]   Culture, blood (Routine x 2)     Status: None (Preliminary result)   Collection Time: 04/07/23  6:30 PM   Specimen: BLOOD RIGHT HAND  Result Value  Ref Range Status   Specimen Description BLOOD RIGHT HAND  Final   Special Requests   Final    BOTTLES DRAWN AEROBIC AND ANAEROBIC Blood Culture adequate volume   Culture   Final    NO GROWTH 2 DAYS Performed at [facility], [address] [place], [place] [address]    Report Status PENDING  Incomplete  Respiratory (~20 pathogens) panel by PCR     Status: None   Collection Time: 04/08/23  4:42 PM   Specimen: Nasopharyngeal Swab; Respiratory  Result Value Ref Range Status   Adenovirus NOT DETECTED NOT DETECTED Final   Coronavirus 229E NOT DETECTED NOT DETECTED Final    Comment: (NOTE) The Coronavirus on the Respiratory Panel, DOES NOT test for the novel  Coronavirus (2019 nCoV)    Coronavirus HKU1 NOT DETECTED NOT DETECTED Final   Coronavirus NL63 NOT DETECTED NOT DETECTED Final   Coronavirus OC43 NOT DETECTED NOT DETECTED Final   Metapneumovirus NOT DETECTED NOT DETECTED Final   Rhinovirus / Enterovirus NOT DETECTED NOT DETECTED Final   Influenza A NOT DETECTED NOT DETECTED Final   Influenza B NOT DETECTED NOT DETECTED Final   Parainfluenza Virus 1 NOT DETECTED NOT DETECTED Final   Parainfluenza Virus 2 NOT DETECTED NOT DETECTED Final   Parainfluenza Virus 3 NOT DETECTED NOT DETECTED  Final   Parainfluenza Virus 4 NOT DETECTED NOT DETECTED Final   Respiratory Syncytial Virus NOT DETECTED NOT DETECTED Final   Bordetella pertussis NOT DETECTED NOT DETECTED Final   Bordetella Parapertussis NOT DETECTED NOT DETECTED Final   Chlamydophila pneumoniae NOT DETECTED NOT DETECTED Final   Mycoplasma pneumoniae NOT DETECTED NOT DETECTED Final    Comment: Performed at [facility], [address], [place], [place] [address]     Serology:   Imaging: If present, new imagings (plain films, ct scans, and mri) have been personally visualized and interpreted; radiology reports have been reviewed. Decision making incorporated into the Impression / Recommendations.   Raymondo Band, MD [facility] [phone] pager    04/09/2023, 3:55 PM

## 2023-04-09 NOTE — Plan of Care (Signed)

## 2023-04-09 NOTE — TOC Benefit Eligibility Note (Signed)
Patient Product/process development scientist completed.    The patient is insured through [employer]. Patient has [employer], may use a copay card, and/or apply for patient assistance if available.    Ran test claim for Biktarvy and the current 30 day co-pay is $0.00.   This test claim was processed through [facility]- copay amounts may vary at other pharmacies due to pharmacy/plan contracts, or as the patient moves through the different stages of their insurance plan.     Roland Earl, CPHT Pharmacy Technician III Certified Patient Advocate [facility] Patient Advocate Team Direct Number: [phone]  Fax: [phone]

## 2023-04-09 NOTE — Progress Notes (Signed)
Patient refused zyprexa, MD notified, no new orders, will continue to monitor. Patient stated that he is not schizophrenic, that his brother is the schizophrenic one, patient stated he has never been diagnosed with schizophrenia and is tired of the staff trying to give him medications he does not need.

## 2023-04-10 ENCOUNTER — Other Ambulatory Visit (HOSPITAL_COMMUNITY): Payer: Self-pay

## 2023-04-10 DIAGNOSIS — B20 Human immunodeficiency virus [HIV] disease: Secondary | ICD-10-CM | POA: Diagnosis not present

## 2023-04-10 DIAGNOSIS — R5081 Fever presenting with conditions classified elsewhere: Secondary | ICD-10-CM | POA: Diagnosis not present

## 2023-04-10 DIAGNOSIS — D709 Neutropenia, unspecified: Secondary | ICD-10-CM | POA: Diagnosis not present

## 2023-04-10 LAB — COMPREHENSIVE METABOLIC PANEL
ALT: 12 U/L (ref 0–44)
AST: 19 U/L (ref 15–41)
Albumin: 3.6 g/dL (ref 3.5–5.0)
Alkaline Phosphatase: 66 U/L (ref 38–126)
Anion gap: 9 (ref 5–15)
BUN: 14 mg/dL (ref 6–20)
CO2: 22 mmol/L (ref 22–32)
Calcium: 9.1 mg/dL (ref 8.9–10.3)
Chloride: 105 mmol/L (ref 98–111)
Creatinine, Ser: 0.91 mg/dL (ref 0.61–1.24)
GFR, Estimated: >60 mL/min (ref >60)
Glucose, Bld: 101 mg/dL — ABNORMAL HIGH (ref 70–99)
Potassium: 4.2 mmol/L (ref 3.5–5.1)
Sodium: 136 mmol/L (ref 135–145)
Total Bilirubin: 0.6 mg/dL (ref 0.3–1.2)
Total Protein: 8 g/dL (ref 6.5–8.1)

## 2023-04-10 LAB — CBC WITH DIFFERENTIAL/PLATELET
Abs Immature Granulocytes: 0.01 10*3/uL (ref 0.00–0.07)
Basophils Absolute: 0 10*3/uL (ref 0.0–0.1)
Basophils Relative: 1 %
Eosinophils Absolute: 0 10*3/uL (ref 0.0–0.5)
Eosinophils Relative: 2 %
HCT: 40.2 % (ref 39.0–52.0)
Hemoglobin: 13.2 g/dL (ref 13.0–17.0)
Immature Granulocytes: 1 %
Lymphocytes Relative: 78 %
Lymphs Abs: 1.4 10*3/uL (ref 0.7–4.0)
MCH: 33.2 pg (ref 26.0–34.0)
MCHC: 32.8 g/dL (ref 30.0–36.0)
MCV: 101.3 fL — ABNORMAL HIGH (ref 80.0–100.0)
Monocytes Absolute: 0.1 10*3/uL (ref 0.1–1.0)
Monocytes Relative: 7 %
Neutro Abs: 0.2 10*3/uL — CL (ref 1.7–7.7)
Neutrophils Relative %: 11 %
Platelets: 190 10*3/uL (ref 150–400)
RBC: 3.97 MIL/uL — ABNORMAL LOW (ref 4.22–5.81)
RDW: 12.5 % (ref 11.5–15.5)
WBC: 1.7 10*3/uL — ABNORMAL LOW (ref 4.0–10.5)
nRBC: 0 % (ref 0.0–0.2)

## 2023-04-10 LAB — EPSTEIN-BARR VIRUS (EBV) ANTIBODY PROFILE
EBV NA IgG: >600 U/mL — ABNORMAL HIGH (ref 0.0–17.9)
EBV VCA IgG: >600 U/mL — ABNORMAL HIGH (ref 0.0–17.9)
EBV VCA IgM: <36 U/mL (ref 0.0–35.9)

## 2023-04-10 LAB — CMV IGM: CMV IgM: <30 [AU]/ml (ref 0.0–29.9)

## 2023-04-10 LAB — CMV DNA, QUANTITATIVE, PCR
CMV DNA Quant: POSITIVE [IU]/mL
Log10 CMV Qn DNA Pl: UNDETERMINED {Log}

## 2023-04-10 LAB — CMV ANTIBODY, IGG (EIA): CMV Ab - IgG: >10 U/mL — ABNORMAL HIGH (ref 0.00–0.59)

## 2023-04-10 MED ORDER — BIKTARVY 50-200-25 MG PO TABS
1.0000 | ORAL_TABLET | Freq: Every day | ORAL | 0 refills | Status: DC
  Filled 2023-04-10: qty 30, 30d supply, fill #0

## 2023-04-10 NOTE — Progress Notes (Signed)
Pharmacy Note/Antimicrobial Stewardship   Delivered one month supply of patient's Biktarvy from [employer]. Patient has been on therapy before and did not have any questions regarding medication.   Roslyn Smiling, PharmD PGY1 Pharmacy Resident 04/10/2023 2:12 PM

## 2023-04-10 NOTE — Progress Notes (Signed)
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, aids, here with febrile syndrome        #febrile syndrome #neutropenic  No localizing sx 3 days nonspecific flu like illness including dyspnea  Cxr clear Neutropenia is mild Normal lft on presentation; no diarrhea; no thrombocytopenia Flu/rsv/covid pcr negative  10/20 bcx ngtd   Suspect a viral syndrome and will send full respiratory viral pcr >>> bacterial infection  Will continue to trend lft for the next few days - I do not suspect hepatitis panel Do not suspect tick related disease   He does have relative hypotension and reasonable to keep empiric abx for now   04/09/23 respiratory viral pcr negative; wbc continues to trend down; differential showed reactive lymphocytes; lft remains same. Will broaden ddx to include parvovirus, hepatitis, cmv/ebv infection. I do not suspect bacterial infection still at this time  04/10/23 cmv igm negative; igg shows prior infection; ebv ab in process; acute hepatitis panel negative; parvo virus ab/pcr in progress. Wbc is improving. 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; denies msm/ivdu risk; presumed heterosexual transmission Seen at [facility] last seen 03/2022 03/2022 viral load 47 03/2023 cd4 284 (33%)   Well controlled previously and cd4 percentage this admission is stable compated to late 2023 when he was virologically controlled   Previously was taking biktarvy and said he ran out for about a couple weeks   As he had maintained at least a year on bactrim prophy and 6 months beyond that with good cd4 level, had stopped bactrim           Plan: Stop cefepime F/u final serology virus testing As wbc improving;  anticipate could dc tomorrow if trend continues and f/u [facility] clinic for ongoing hiv care Will need 30 day supply biktarvy on hand on discharge Discussed with primary team      Principal Problem:   Febrile neutropenia (HCC)   No Known Allergies  Scheduled Meds:  bictegravir-emtricitabine-tenofovir AF  1 tablet Oral Daily   enoxaparin (LOVENOX) injection  40 mg Subcutaneous Q24H   feeding supplement  237 mL Oral TID BM   OLANZapine  5 mg Oral QHS   Continuous Infusions:  ceFEPime (MAXIPIME) IV 2 g (04/10/23 0533)   PRN Meds:.acetaminophen, melatonin, oxyCODONE, polyethylene glycol, prochlorperazine   SUBJECTIVE: No new complaint Afebrile Wbc improving  Review of Systems: ROS All other ROS was negative, except mentioned above     OBJECTIVE: Vitals:   04/09/23 0511 04/09/23 1428 04/09/23 1948 04/10/23 0436  BP: 109/70 116/77 116/82 104/78  Pulse: 92 (!) 105 86 76  Resp: 18  18 20   Temp: 98.8 F (37.1 C) 98.4 F (36.9 C) 98.4 F (36.9 C) 98.3 F (36.8 C)  TempSrc: Oral Oral Oral   SpO2: 100% 100% 100% 100%  Weight:      Height:       Body  mass index is 26.5 kg/m.  Physical Exam General/constitutional: no distress, pleasant HEENT: Normocephalic, PER, Conj Clear, EOMI, missing teeth Neck supple CV: rrr no mrg Lungs: clear to auscultation, normal respiratory effort Abd: Soft, Nontender Ext: no edema Skin: No Rash Neuro: nonfocal MSK: no peripheral joint swelling/tenderness/warmth; back spines nontender      Lab Results Lab Results  Component Value Date   WBC 1.7 (L) 04/10/2023   HGB 13.2 04/10/2023   HCT 40.2 04/10/2023   MCV 101.3 (H) 04/10/2023   PLT 190 04/10/2023    Lab Results  Component Value Date   CREATININE 0.91 04/10/2023   BUN 14 04/10/2023   NA 136 04/10/2023   K 4.2 04/10/2023   CL 105 04/10/2023   CO2 22 04/10/2023    Lab Results  Component Value Date   ALT 12 04/10/2023   AST 19 04/10/2023   ALKPHOS 66 04/10/2023    BILITOT 0.6 04/10/2023      Microbiology: Recent Results (from the past 240 hour(s))  Culture, blood (Routine x 2)     Status: None (Preliminary result)   Collection Time: 04/07/23  6:25 PM   Specimen: BLOOD  Result Value Ref Range Status   Specimen Description BLOOD LEFT ANTECUBITAL  Final   Special Requests   Final    BOTTLES DRAWN AEROBIC AND ANAEROBIC Blood Culture adequate volume   Culture   Final    NO GROWTH 3 DAYS Performed at [facility], [address] [place], [place] [address]    Report Status PENDING  Incomplete  Resp panel by RT-PCR (RSV, Flu A&B, Covid) Anterior Nasal Swab     Status: None   Collection Time: 04/07/23  6:26 PM   Specimen: Anterior Nasal Swab  Result Value Ref Range Status   SARS Coronavirus 2 by RT PCR NEGATIVE NEGATIVE Final   Influenza A by PCR NEGATIVE NEGATIVE Final   Influenza B by PCR NEGATIVE NEGATIVE Final    Comment: (NOTE) The Xpert Xpress SARS-CoV-2/FLU/RSV plus assay is intended as an aid in the diagnosis of influenza from Nasopharyngeal swab specimens and should not be used as a sole basis for treatment. Nasal washings and aspirates are unacceptable for Xpert Xpress SARS-CoV-2/FLU/RSV testing.  Fact Sheet for Patients: BloggerCourse.com  Fact Sheet for Healthcare Providers: SeriousBroker.it  This test is not yet approved or cleared by the [place] FDA and has been authorized for detection and/or diagnosis of SARS-CoV-2 by FDA under an Emergency Use Authorization (EUA). This EUA will remain in effect (meaning this test can be used) for the duration of the COVID-19 declaration under Section 564(b)(1) of the Act, 21 U.S.C. section 360bbb-3(b)(1), unless the authorization is terminated or revoked.     Resp Syncytial Virus by PCR NEGATIVE NEGATIVE Final    Comment: (NOTE) Fact Sheet for Patients: BloggerCourse.com  Fact Sheet for Healthcare  Providers: SeriousBroker.it  This test is not yet approved or cleared by the [place] FDA and has been authorized for detection and/or diagnosis of SARS-CoV-2 by FDA under an Emergency Use Authorization (EUA). This EUA will remain in effect (meaning this test can be used) for the duration of the COVID-19 declaration under Section 564(b)(1) of the Act, 21 U.S.C. section 360bbb-3(b)(1), unless the authorization is terminated or revoked.  Performed at [facility], [address], [place], [place] [address]   Culture, blood (Routine x 2)     Status: None (Preliminary result)   Collection Time: 04/07/23  6:30 PM  Specimen: BLOOD RIGHT HAND  Result Value Ref Range Status   Specimen Description BLOOD RIGHT HAND  Final   Special Requests   Final    BOTTLES DRAWN AEROBIC AND ANAEROBIC Blood Culture adequate volume   Culture   Final    NO GROWTH 3 DAYS Performed at [facility], [address], [place], [place] [address]    Report Status PENDING  Incomplete  Respiratory (~20 pathogens) panel by PCR     Status: None   Collection Time: 04/08/23  4:42 PM   Specimen: Nasopharyngeal Swab; Respiratory  Result Value Ref Range Status   Adenovirus NOT DETECTED NOT DETECTED Final   Coronavirus 229E NOT DETECTED NOT DETECTED Final    Comment: (NOTE) The Coronavirus on the Respiratory Panel, DOES NOT test for the novel  Coronavirus (2019 nCoV)    Coronavirus HKU1 NOT DETECTED NOT DETECTED Final   Coronavirus NL63 NOT DETECTED NOT DETECTED Final   Coronavirus OC43 NOT DETECTED NOT DETECTED Final   Metapneumovirus NOT DETECTED NOT DETECTED Final   Rhinovirus / Enterovirus NOT DETECTED NOT DETECTED Final   Influenza A NOT DETECTED NOT DETECTED Final   Influenza B NOT DETECTED NOT DETECTED Final   Parainfluenza Virus 1 NOT DETECTED NOT DETECTED Final   Parainfluenza Virus 2 NOT DETECTED NOT DETECTED Final   Parainfluenza Virus 3 NOT DETECTED NOT DETECTED  Final   Parainfluenza Virus 4 NOT DETECTED NOT DETECTED Final   Respiratory Syncytial Virus NOT DETECTED NOT DETECTED Final   Bordetella pertussis NOT DETECTED NOT DETECTED Final   Bordetella Parapertussis NOT DETECTED NOT DETECTED Final   Chlamydophila pneumoniae NOT DETECTED NOT DETECTED Final   Mycoplasma pneumoniae NOT DETECTED NOT DETECTED Final    Comment: Performed at [facility], [address] [place], [place] [address]     Serology:   Imaging: If present, new imagings (plain films, ct scans, and mri) have been personally visualized and interpreted; radiology reports have been reviewed. Decision making incorporated into the Impression / Recommendations.   Raymondo Band, MD [facility] [phone] pager    04/10/2023, 1:19 PM

## 2023-04-10 NOTE — Progress Notes (Signed)
PROGRESS NOTE    LB MAUGHAN  NWG:[id number] DOB: 06-27-1985 DOA: 04/07/2023 PCP: Patient, No Pcp Per   Brief Narrative:  37 year old male with HIV, history of medication nonadherence, comes to the hospital with fever, chills, shortness of breath for the past 3 days. He tells me he ran out of his HIV meds about a week and a half ago. He was found to be neutropenic in the ED, febrile, was placed on antibiotics and admitted to the hospital. No clear source.   Assessment & Plan:   Principal Problem:   Febrile neutropenia (HCC)  SIRS, fever in the setting of neutropenia -patient was placed on antibiotics.  ID consulted, extensive workup in progress.  Cultures negative so far.  COVID, flu, RSV and chest x-ray as well as UA negative.  On broad-spectrum antibiotics managed by ID.  Has remained afebrile for more than 48 hours now.  Last temperature spike of 101.9 at around 9 PM on 04/07/2023.  Patient's neutropenia is improving.  Oncology recommends watching another night and repeating CBC tomorrow morning and if improved, discharged home.  Possibly not on any oxygen.  Still some of the workup is pending.   HIV, history of nonadherence/noncompliance with-ID consulted   Hyponatremia -resolved.   Anemia, normocytic -hemoglobin improved.   History of schizophrenia-continue Zyprexa  DVT prophylaxis: enoxaparin (LOVENOX) injection 40 mg Start: 04/08/23 1000   Code Status: Full Code  Family Communication:  None present at bedside.  Plan of care discussed with patient in length and he/she verbalized understanding and agreed with it.  Status is: Inpatient Remains inpatient appropriate because: ANC improving, repeat labs tomorrow morning.  If improved, may discharge home tomorrow.   Estimated body mass index is 26.5 kg/m as calculated from the following:   Height as of this encounter: 5\' 11"  (1.803 m).   Weight as of this encounter: 86.2 kg.    Nutritional Assessment: Body mass index is  26.5 kg/m.Marland Kitchen Seen by dietician.  I agree with the assessment and plan as outlined below: Nutrition Status:        . Skin Assessment: I have examined the patient's skin and I agree with the wound assessment as performed by the wound care RN as outlined below:    Consultants:  ID  Procedures:  None  Antimicrobials:  Anti-infectives (From admission, onward)    Start     Dose/Rate Route Frequency Ordered Stop   04/10/23 0000  bictegravir-emtricitabine-tenofovir AF (BIKTARVY) 50-200-25 MG TABS tablet       Note to Pharmacy: Patient at [facility]. Call [phone] and I will pick up. Thank you!   1 tablet Oral Daily 04/10/23 1030     04/08/23 1000  bictegravir-emtricitabine-tenofovir AF (BIKTARVY) 50-200-25 MG per tablet 1 tablet        1 tablet Oral Daily 04/07/23 2329     04/08/23 1000  sulfamethoxazole-trimethoprim (BACTRIM DS) 800-160 MG per tablet 1 tablet  Status:  Discontinued        1 tablet Oral Every M-W-F 04/07/23 2329 04/08/23 1641   04/08/23 1000  metroNIDAZOLE (FLAGYL) IVPB 500 mg  Status:  Discontinued        500 mg 100 mL/hr over 60 Minutes Intravenous Every 12 hours 04/08/23 0512 04/08/23 1641   04/08/23 1000  vancomycin (VANCOREADY) IVPB 1250 mg/250 mL  Status:  Discontinued        1,250 mg 166.7 mL/hr over 90 Minutes Intravenous Every 12 hours 04/08/23 0518 04/09/23 1603   04/08/23 0600  ceFEPIme (  MAXIPIME) 2 g in sodium chloride 0.9 % 100 mL IVPB        2 g 200 mL/hr over 30 Minutes Intravenous Every 8 hours 04/08/23 0517     04/07/23 1930  ceFEPIme (MAXIPIME) 2 g in sodium chloride 0.9 % 100 mL IVPB        2 g 200 mL/hr over 30 Minutes Intravenous  Once 04/07/23 1924 04/07/23 2023   04/07/23 1930  metroNIDAZOLE (FLAGYL) IVPB 500 mg        500 mg 100 mL/hr over 60 Minutes Intravenous  Once 04/07/23 1924 04/07/23 2132   04/07/23 1930  vancomycin (VANCOCIN) IVPB 1000 mg/200 mL premix  Status:  Discontinued        1,000 mg 200 mL/hr over 60 Minutes  Intravenous  Once 04/07/23 1924 04/07/23 1926   04/07/23 1930  vancomycin (VANCOREADY) IVPB 1750 mg/350 mL        1,750 mg 175 mL/hr over 120 Minutes Intravenous  Once 04/07/23 1926 04/07/23 2243         Subjective: Patient seen and examined.  He has no complaints.  Objective: Vitals:   04/09/23 0511 04/09/23 1428 04/09/23 1948 04/10/23 0436  BP: 109/70 116/77 116/82 104/78  Pulse: 92 (!) 105 86 76  Resp: 18  18 20   Temp: 98.8 F (37.1 C) 98.4 F (36.9 C) 98.4 F (36.9 C) 98.3 F (36.8 C)  TempSrc: Oral Oral Oral   SpO2: 100% 100% 100% 100%  Weight:      Height:        Intake/Output Summary (Last 24 hours) at 04/10/2023 1310 Last data filed at 04/09/2023 2320 Gross per 24 hour  Intake 800 ml  Output 600 ml  Net 200 ml   Filed Weights   04/07/23 1804  Weight: 86.2 kg    Examination:  General exam: Appears calm and comfortable  Respiratory system: Clear to auscultation. Respiratory effort normal. Cardiovascular system: S1 & S2 heard, RRR. No JVD, murmurs, rubs, gallops or clicks. No pedal edema. Gastrointestinal system: Abdomen is nondistended, soft and nontender. No organomegaly or masses felt. Normal bowel sounds heard. Central nervous system: Alert and oriented. No focal neurological deficits. Extremities: Symmetric 5 x 5 power. Skin: No rashes, lesions or ulcers.    Data Reviewed: I have personally reviewed following labs and imaging studies  CBC: Recent Labs  Lab 04/07/23 1830 04/08/23 0744 04/09/23 0603 04/10/23 0905  WBC 2.4* 1.7* 1.3* 1.7*  NEUTROABS 0.9* 0.7* 0.4* 0.2*  HGB 12.4* 11.5* 12.9* 13.2  HCT 37.9* 35.0* 39.4 40.2  MCV 99.5 98.3 101.3* 101.3*  PLT 215 181 194 190   Basic Metabolic Panel: Recent Labs  Lab 04/07/23 1830 04/08/23 0744 04/09/23 0603 04/10/23 0534  NA 132* 133* 136 136  K 3.9 4.5 4.5 4.2  CL 101 106 106 105  CO2 20* 20* 22 22  GLUCOSE 94 116* 103* 101*  BUN 10 10 14 14   CREATININE 1.10 0.89 0.93 0.91   CALCIUM 8.8* 8.2* 9.3 9.1  MG  --  1.9 2.1  --   PHOS  --  3.7  --   --    GFR: Estimated Creatinine Clearance: 117.2 mL/min (by C-G formula based on SCr of 0.91 mg/dL). Liver Function Tests: Recent Labs  Lab 04/07/23 1830 04/09/23 0603 04/10/23 0534  AST 22 15 19   ALT 14 13 12   ALKPHOS 71 70 66  BILITOT 0.7 0.7 0.6  PROT 7.2 7.4 8.0  ALBUMIN 3.5 3.5 3.6   No  results for input(s): "LIPASE", "AMYLASE" in the last 168 hours. No results for input(s): "AMMONIA" in the last 168 hours. Coagulation Profile: Recent Labs  Lab 04/07/23 1830  INR 0.9   Cardiac Enzymes: No results for input(s): "CKTOTAL", "CKMB", "CKMBINDEX", "TROPONINI" in the last 168 hours. BNP (last 3 results) No results for input(s): "PROBNP" in the last 8760 hours. HbA1C: No results for input(s): "HGBA1C" in the last 72 hours. CBG: No results for input(s): "GLUCAP" in the last 168 hours. Lipid Profile: No results for input(s): "CHOL", "HDL", "LDLCALC", "TRIG", "CHOLHDL", "LDLDIRECT" in the last 72 hours. Thyroid Function Tests: No results for input(s): "TSH", "T4TOTAL", "FREET4", "T3FREE", "THYROIDAB" in the last 72 hours. Anemia Panel: Recent Labs    04/09/23 0603  VITAMINB12 191  FOLATE 11.5  FERRITIN 427*  TIBC 312  IRON 75  RETICCTPCT 0.8   Sepsis Labs: Recent Labs  Lab 04/07/23 1836 04/07/23 2040  LATICACIDVEN 2.0* 0.8    Recent Results (from the past 240 hour(s))  Culture, blood (Routine x 2)     Status: None (Preliminary result)   Collection Time: 04/07/23  6:25 PM   Specimen: BLOOD  Result Value Ref Range Status   Specimen Description BLOOD LEFT ANTECUBITAL  Final   Special Requests   Final    BOTTLES DRAWN AEROBIC AND ANAEROBIC Blood Culture adequate volume   Culture   Final    NO GROWTH 3 DAYS Performed at [facility], [address], [place], [place] [address]    Report Status PENDING  Incomplete  Resp panel by RT-PCR (RSV, Flu A&B, Covid) Anterior Nasal Swab      Status: None   Collection Time: 04/07/23  6:26 PM   Specimen: Anterior Nasal Swab  Result Value Ref Range Status   SARS Coronavirus 2 by RT PCR NEGATIVE NEGATIVE Final   Influenza A by PCR NEGATIVE NEGATIVE Final   Influenza B by PCR NEGATIVE NEGATIVE Final    Comment: (NOTE) The Xpert Xpress SARS-CoV-2/FLU/RSV plus assay is intended as an aid in the diagnosis of influenza from Nasopharyngeal swab specimens and should not be used as a sole basis for treatment. Nasal washings and aspirates are unacceptable for Xpert Xpress SARS-CoV-2/FLU/RSV testing.  Fact Sheet for Patients: BloggerCourse.com  Fact Sheet for Healthcare Providers: SeriousBroker.it  This test is not yet approved or cleared by the [place] FDA and has been authorized for detection and/or diagnosis of SARS-CoV-2 by FDA under an Emergency Use Authorization (EUA). This EUA will remain in effect (meaning this test can be used) for the duration of the COVID-19 declaration under Section 564(b)(1) of the Act, 21 U.S.C. section 360bbb-3(b)(1), unless the authorization is terminated or revoked.     Resp Syncytial Virus by PCR NEGATIVE NEGATIVE Final    Comment: (NOTE) Fact Sheet for Patients: BloggerCourse.com  Fact Sheet for Healthcare Providers: SeriousBroker.it  This test is not yet approved or cleared by the [place] FDA and has been authorized for detection and/or diagnosis of SARS-CoV-2 by FDA under an Emergency Use Authorization (EUA). This EUA will remain in effect (meaning this test can be used) for the duration of the COVID-19 declaration under Section 564(b)(1) of the Act, 21 U.S.C. section 360bbb-3(b)(1), unless the authorization is terminated or revoked.  Performed at [facility], [address], [place], [place] [address]   Culture, blood (Routine x 2)     Status: None (Preliminary  result)   Collection Time: 04/07/23  6:30 PM   Specimen: BLOOD  RIGHT HAND  Result Value Ref Range Status   Specimen Description BLOOD RIGHT HAND  Final   Special Requests   Final    BOTTLES DRAWN AEROBIC AND ANAEROBIC Blood Culture adequate volume   Culture   Final    NO GROWTH 3 DAYS Performed at [facility], [address] [place], [place] [address]    Report Status PENDING  Incomplete  Respiratory (~20 pathogens) panel by PCR     Status: None   Collection Time: 04/08/23  4:42 PM   Specimen: Nasopharyngeal Swab; Respiratory  Result Value Ref Range Status   Adenovirus NOT DETECTED NOT DETECTED Final   Coronavirus 229E NOT DETECTED NOT DETECTED Final    Comment: (NOTE) The Coronavirus on the Respiratory Panel, DOES NOT test for the novel  Coronavirus (2019 nCoV)    Coronavirus HKU1 NOT DETECTED NOT DETECTED Final   Coronavirus NL63 NOT DETECTED NOT DETECTED Final   Coronavirus OC43 NOT DETECTED NOT DETECTED Final   Metapneumovirus NOT DETECTED NOT DETECTED Final   Rhinovirus / Enterovirus NOT DETECTED NOT DETECTED Final   Influenza A NOT DETECTED NOT DETECTED Final   Influenza B NOT DETECTED NOT DETECTED Final   Parainfluenza Virus 1 NOT DETECTED NOT DETECTED Final   Parainfluenza Virus 2 NOT DETECTED NOT DETECTED Final   Parainfluenza Virus 3 NOT DETECTED NOT DETECTED Final   Parainfluenza Virus 4 NOT DETECTED NOT DETECTED Final   Respiratory Syncytial Virus NOT DETECTED NOT DETECTED Final   Bordetella pertussis NOT DETECTED NOT DETECTED Final   Bordetella Parapertussis NOT DETECTED NOT DETECTED Final   Chlamydophila pneumoniae NOT DETECTED NOT DETECTED Final   Mycoplasma pneumoniae NOT DETECTED NOT DETECTED Final    Comment: Performed at [facility], [address], [place], [place] [address]     Radiology Studies: No results found.  Scheduled Meds:  bictegravir-emtricitabine-tenofovir AF  1 tablet Oral Daily   enoxaparin (LOVENOX) injection  40 mg  Subcutaneous Q24H   feeding supplement  237 mL Oral TID BM   OLANZapine  5 mg Oral QHS   Continuous Infusions:  ceFEPime (MAXIPIME) IV 2 g (04/10/23 0533)     LOS: 3 days   Hughie Closs, MD [facility]  04/10/2023, 1:10 PM   *Please note that this is a verbal dictation therefore any spelling or grammatical errors are due to the "Dragon Medical One" system interpretation.  Please page via Amion and do not message via secure chat for urgent patient care matters. Secure chat can be used for non urgent patient care matters.  How to contact the [facility] Attending or Consulting provider 7A - 7P or covering provider during after hours 7P -7A, for this patient?  Check the care team in [facility] and look for a) attending/consulting [facility] provider listed and b) the [facility] team listed. Page or secure chat 7A-7P. Log into [email or web address] and use [place]'s universal password to access. If you do not have the password, please contact the hospital operator. Locate the [facility] provider you are looking for under [facility] and page to a number that you can be directly reached. If you still have difficulty reaching the provider, please page the [facility] (Director on Call) for the Hospitalists listed on amion for assistance.

## 2023-04-10 NOTE — Plan of Care (Signed)

## 2023-04-10 NOTE — Progress Notes (Signed)
   04/10/23 0955  TOC Brief Assessment  Insurance and Status Reviewed  Patient has primary care physician No (Pt switching PCP's)  Home environment has been reviewed Single family home  Prior level of function: Independent  Prior/Current Home Services No current home services  Social Determinants of Health Reivew SDOH reviewed no interventions necessary  Readmission risk has been reviewed Yes  Transition of care needs no transition of care needs at this time

## 2023-04-11 ENCOUNTER — Other Ambulatory Visit (HOSPITAL_COMMUNITY): Payer: Self-pay

## 2023-04-11 DIAGNOSIS — D709 Neutropenia, unspecified: Secondary | ICD-10-CM | POA: Diagnosis not present

## 2023-04-11 DIAGNOSIS — R5081 Fever presenting with conditions classified elsewhere: Secondary | ICD-10-CM | POA: Diagnosis not present

## 2023-04-11 LAB — CBC WITH DIFFERENTIAL/PLATELET
Abs Immature Granulocytes: 0 10*3/uL (ref 0.00–0.07)
Basophils Absolute: 0 10*3/uL (ref 0.0–0.1)
Basophils Relative: 1 %
Eosinophils Absolute: 0.1 10*3/uL (ref 0.0–0.5)
Eosinophils Relative: 5 %
HCT: 38.8 % — ABNORMAL LOW (ref 39.0–52.0)
Hemoglobin: 12.8 g/dL — ABNORMAL LOW (ref 13.0–17.0)
Immature Granulocytes: 0 %
Lymphocytes Relative: 66 %
Lymphs Abs: 1.5 10*3/uL (ref 0.7–4.0)
MCH: 33 pg (ref 26.0–34.0)
MCHC: 33 g/dL (ref 30.0–36.0)
MCV: 100 fL (ref 80.0–100.0)
Monocytes Absolute: 0.2 10*3/uL (ref 0.1–1.0)
Monocytes Relative: 10 %
Neutro Abs: 0.4 10*3/uL — CL (ref 1.7–7.7)
Neutrophils Relative %: 18 %
Platelets: 190 10*3/uL (ref 150–400)
RBC: 3.88 MIL/uL — ABNORMAL LOW (ref 4.22–5.81)
RDW: 12.3 % (ref 11.5–15.5)
WBC: 2.4 10*3/uL — ABNORMAL LOW (ref 4.0–10.5)
nRBC: 0 % (ref 0.0–0.2)

## 2023-04-11 LAB — HIV-1 RNA QUANT-NO REFLEX-BLD
HIV 1 RNA Quant: 14800 {copies}/mL
LOG10 HIV-1 RNA: 4.17 {Log}

## 2023-04-11 LAB — PARVOVIRUS B19 ANTIBODY, IGG AND IGM
Parovirus B19 IgG Abs: 0.4 {index} (ref 0.0–0.8)
Parovirus B19 IgM Abs: 0.1 {index} (ref 0.0–0.8)

## 2023-04-11 MED ORDER — OLANZAPINE 5 MG PO TABS
5.0000 mg | ORAL_TABLET | Freq: Every day | ORAL | 0 refills | Status: AC
  Filled 2023-04-11: qty 30, 30d supply, fill #0

## 2023-04-11 NOTE — Discharge Summary (Signed)
Physician Discharge Summary  Jason Herman QQV:[id number] DOB: September 02, 1985 DOA: 04/07/2023  PCP: Patient, No Pcp Per  Admit date: 04/07/2023 Discharge date: 04/11/2023 30 Day Unplanned Readmission Risk Score    Flowsheet Row ED to Hosp-Admission (Current) from 04/07/2023 in [facility]  30 Day Unplanned Readmission Risk Score (%) 17.28 Filed at 04/11/2023 0801       This score is the patient's risk of an unplanned readmission within 30 days of being discharged (0 -100%). The score is based on dignosis, age, lab data, medications, orders, and past utilization.   Low:  0-14.9   Medium: 15-21.9   High: 22-29.9   Extreme: 30 and above          Admitted From: Home Disposition: Home  Recommendations for Outpatient Follow-up:  Follow up with PCP in 1-2 weeks Please obtain BMP/CBC in one week Follow-up with ID in 1 month. Please follow up with your PCP on the following pending results: Unresulted Labs (From admission, onward)     Start     Ordered   04/14/23 0500  Creatinine, serum  (enoxaparin (LOVENOX)    CrCl >/= 30 ml/min)  Weekly,   R     Comments: while on enoxaparin therapy    04/07/23 2328   04/10/23 0849  CBC with Differential/Platelet  ONCE - URGENT,   URGENT        04/10/23 0848   04/10/23 0500  HIV-1 RNA quant-no reflex-bld  Tomorrow morning,   R        04/09/23 1603   04/09/23 0946  Human parvovirus DNA detection by PCR  Once,   R        04/09/23 0945   04/09/23 0946  Parvovirus B19 antibody, IgG and IgM  Once,   R        04/09/23 0945              Home Health: None Equipment/Devices: None  Discharge Condition: Stable for CODE STATUS: FULL Code Diet recommendation: Regular  Subjective: Seen and examined.  He has no complaints.  He wants to go home.  Brief/Interim Summary: 37 year old male with HIV, history of medication nonadherence, comes to the hospital with fever, chills, shortness of breath for the past 3  days. he ran out of his HIV meds about a week and a half ago. He was found to be neutropenic in the ED, febrile, was placed on antibiotics and admitted to the hospital.  ID consulted.  Details as following.   SIRS, fever in the setting of neutropenia -patient was placed on antibiotics.  ID consulted, extensive workup done.  Cultures negative so far.  COVID, flu, RSV and chest x-ray as well as UA negative.  Last temperature spike of 101.9 at around 9 PM on 04/07/2023.  Patient's neutropenia is improving.  ID suspected viral syndrome but all the tests are negative including respiratory viral panel.  Some of the viral tests are still pending but that will not change the management.  Patient has remained afebrile for more than 3 days and patient's antibiotics were discontinued yesterday and he was observed for another 24 hours and he is afebrile again.  ID has cleared him for discharge and have provided him with 1 month supply of his Biktarvy and will follow-up with him as outpatient in 1 month.   HIV, history of nonadherence/noncompliance with- as above   Hyponatremia -resolved.   Anemia, normocytic -hemoglobin improved.   History of schizophrenia-he was  not taking any Zyprexa but I have prescribed him, continue Zyprexa  Discharge plan was discussed with patient and/or family member and they verbalized understanding and agreed with it.  Discharge Diagnoses:  Principal Problem:   Febrile neutropenia (HCC) Active Problems:   HIV (human immunodeficiency virus infection) (HCC)    Discharge Instructions   Allergies as of 04/11/2023   No Known Allergies      Medication List     STOP taking these medications    naproxen 500 MG tablet Commonly known as: Naprosyn   OLANZapine 5 MG tablet Commonly known as: ZYPREXA   sulfamethoxazole-trimethoprim 800-160 MG tablet Commonly known as: BACTRIM DS       TAKE these medications    Biktarvy 50-200-25 MG Tabs tablet Generic drug:  bictegravir-emtricitabine-tenofovir AF Take 1 tablet by mouth daily. What changed: additional instructions        Follow-up Information     PCP Follow up.          Daisey Must, DO Follow up in 1 month(s).   Contact information: [address] [place] [address] [phone]                No Known Allergies  Consultations: ID   Procedures/Studies: DG Chest 2 View  Result Date: 04/07/2023 CLINICAL DATA:  Sepsis EXAM: CHEST - 2 VIEW COMPARISON:  None Available. FINDINGS: The heart size and mediastinal contours are within normal limits. Both lungs are clear. The visualized skeletal structures are unremarkable. IMPRESSION: No active cardiopulmonary disease. Electronically Signed   By: Helyn Numbers M.D.   On: 04/07/2023 20:25     Discharge Exam: Vitals:   04/10/23 1934 04/11/23 0509  BP: 118/77 109/80  Pulse: 91 82  Resp: 20 19  Temp:  97.8 F (36.6 C)  SpO2: 97% 100%   Vitals:   04/10/23 0436 04/10/23 1512 04/10/23 1934 04/11/23 0509  BP: 104/78 123/77 118/77 109/80  Pulse: 76 87 91 82  Resp: 20  20 19   Temp: 98.3 F (36.8 C) 98 F (36.7 C)  97.8 F (36.6 C)  TempSrc:    Oral  SpO2: 100% 100% 97% 100%  Weight:      Height:        General: Pt is alert, awake, not in acute distress Cardiovascular: RRR, S1/S2 +, no rubs, no gallops Respiratory: CTA bilaterally, no wheezing, no rhonchi Abdominal: Soft, NT, ND, bowel sounds + Extremities: no edema, no cyanosis    The results of significant diagnostics from this hospitalization (including imaging, microbiology, ancillary and laboratory) are listed below for reference.     Microbiology: Recent Results (from the past 240 hour(s))  Culture, blood (Routine x 2)     Status: None (Preliminary result)   Collection Time: 04/07/23  6:25 PM   Specimen: BLOOD  Result Value Ref Range Status   Specimen Description BLOOD LEFT ANTECUBITAL  Final   Special Requests   Final    BOTTLES DRAWN AEROBIC AND  ANAEROBIC Blood Culture adequate volume   Culture   Final    NO GROWTH 4 DAYS Performed at [facility], [address], [place], [place] [address]    Report Status PENDING  Incomplete  Resp panel by RT-PCR (RSV, Flu A&B, Covid) Anterior Nasal Swab     Status: None   Collection Time: 04/07/23  6:26 PM   Specimen: Anterior Nasal Swab  Result Value Ref Range Status   SARS Coronavirus 2 by RT PCR NEGATIVE NEGATIVE Final  Influenza A by PCR NEGATIVE NEGATIVE Final   Influenza B by PCR NEGATIVE NEGATIVE Final    Comment: (NOTE) The Xpert Xpress SARS-CoV-2/FLU/RSV plus assay is intended as an aid in the diagnosis of influenza from Nasopharyngeal swab specimens and should not be used as a sole basis for treatment. Nasal washings and aspirates are unacceptable for Xpert Xpress SARS-CoV-2/FLU/RSV testing.  Fact Sheet for Patients: BloggerCourse.com  Fact Sheet for Healthcare Providers: SeriousBroker.it  This test is not yet approved or cleared by the [place] FDA and has been authorized for detection and/or diagnosis of SARS-CoV-2 by FDA under an Emergency Use Authorization (EUA). This EUA will remain in effect (meaning this test can be used) for the duration of the COVID-19 declaration under Section 564(b)(1) of the Act, 21 U.S.C. section 360bbb-3(b)(1), unless the authorization is terminated or revoked.     Resp Syncytial Virus by PCR NEGATIVE NEGATIVE Final    Comment: (NOTE) Fact Sheet for Patients: BloggerCourse.com  Fact Sheet for Healthcare Providers: SeriousBroker.it  This test is not yet approved or cleared by the [place] FDA and has been authorized for detection and/or diagnosis of SARS-CoV-2 by FDA under an Emergency Use Authorization (EUA). This EUA will remain in effect (meaning this test can be used) for the duration of the COVID-19 declaration under  Section 564(b)(1) of the Act, 21 U.S.C. section 360bbb-3(b)(1), unless the authorization is terminated or revoked.  Performed at [facility], [address], [place], [place] [address]   Culture, blood (Routine x 2)     Status: None (Preliminary result)   Collection Time: 04/07/23  6:30 PM   Specimen: BLOOD RIGHT HAND  Result Value Ref Range Status   Specimen Description BLOOD RIGHT HAND  Final   Special Requests   Final    BOTTLES DRAWN AEROBIC AND ANAEROBIC Blood Culture adequate volume   Culture   Final    NO GROWTH 4 DAYS Performed at [facility], [address], [place], [place] [address]    Report Status PENDING  Incomplete  Respiratory (~20 pathogens) panel by PCR     Status: None   Collection Time: 04/08/23  4:42 PM   Specimen: Nasopharyngeal Swab; Respiratory  Result Value Ref Range Status   Adenovirus NOT DETECTED NOT DETECTED Final   Coronavirus 229E NOT DETECTED NOT DETECTED Final    Comment: (NOTE) The Coronavirus on the Respiratory Panel, DOES NOT test for the novel  Coronavirus (2019 nCoV)    Coronavirus HKU1 NOT DETECTED NOT DETECTED Final   Coronavirus NL63 NOT DETECTED NOT DETECTED Final   Coronavirus OC43 NOT DETECTED NOT DETECTED Final   Metapneumovirus NOT DETECTED NOT DETECTED Final   Rhinovirus / Enterovirus NOT DETECTED NOT DETECTED Final   Influenza A NOT DETECTED NOT DETECTED Final   Influenza B NOT DETECTED NOT DETECTED Final   Parainfluenza Virus 1 NOT DETECTED NOT DETECTED Final   Parainfluenza Virus 2 NOT DETECTED NOT DETECTED Final   Parainfluenza Virus 3 NOT DETECTED NOT DETECTED Final   Parainfluenza Virus 4 NOT DETECTED NOT DETECTED Final   Respiratory Syncytial Virus NOT DETECTED NOT DETECTED Final   Bordetella pertussis NOT DETECTED NOT DETECTED Final   Bordetella Parapertussis NOT DETECTED NOT DETECTED Final   Chlamydophila pneumoniae NOT DETECTED NOT DETECTED Final   Mycoplasma pneumoniae NOT DETECTED NOT DETECTED Final     Comment: Performed at [facility], [address] [place], [place] [address]     Labs: BNP (last 3  results) No results for input(s): "BNP" in the last 8760 hours. Basic Metabolic Panel: Recent Labs  Lab 04/07/23 1830 04/08/23 0744 04/09/23 0603 04/10/23 0534  NA 132* 133* 136 136  K 3.9 4.5 4.5 4.2  CL 101 106 106 105  CO2 20* 20* 22 22  GLUCOSE 94 116* 103* 101*  BUN 10 10 14 14   CREATININE 1.10 0.89 0.93 0.91  CALCIUM 8.8* 8.2* 9.3 9.1  MG  --  1.9 2.1  --   PHOS  --  3.7  --   --    Liver Function Tests: Recent Labs  Lab 04/07/23 1830 04/09/23 0603 04/10/23 0534  AST 22 15 19   ALT 14 13 12   ALKPHOS 71 70 66  BILITOT 0.7 0.7 0.6  PROT 7.2 7.4 8.0  ALBUMIN 3.5 3.5 3.6   No results for input(s): "LIPASE", "AMYLASE" in the last 168 hours. No results for input(s): "AMMONIA" in the last 168 hours. CBC: Recent Labs  Lab 04/07/23 1830 04/08/23 0744 04/09/23 0603 04/10/23 0905 04/11/23 0822  WBC 2.4* 1.7* 1.3* 1.7* 2.4*  NEUTROABS 0.9* 0.7* 0.4* 0.2* 0.4*  HGB 12.4* 11.5* 12.9* 13.2 12.8*  HCT 37.9* 35.0* 39.4 40.2 38.8*  MCV 99.5 98.3 101.3* 101.3* 100.0  PLT 215 181 194 190 190   Cardiac Enzymes: No results for input(s): "CKTOTAL", "CKMB", "CKMBINDEX", "TROPONINI" in the last 168 hours. BNP: Invalid input(s): "POCBNP" CBG: No results for input(s): "GLUCAP" in the last 168 hours. D-Dimer No results for input(s): "DDIMER" in the last 72 hours. Hgb A1c No results for input(s): "HGBA1C" in the last 72 hours. Lipid Profile No results for input(s): "CHOL", "HDL", "LDLCALC", "TRIG", "CHOLHDL", "LDLDIRECT" in the last 72 hours. Thyroid function studies No results for input(s): "TSH", "T4TOTAL", "T3FREE", "THYROIDAB" in the last 72 hours.  Invalid input(s): "FREET3" Anemia work up Recent Labs    04/09/23 0603  VITAMINB12 191  FOLATE 11.5  FERRITIN 427*  TIBC 312  IRON 75  RETICCTPCT 0.8   Urinalysis    Component Value Date/Time    COLORURINE YELLOW 04/07/2023 2240   APPEARANCEUR CLEAR 04/07/2023 2240   LABSPEC 1.011 04/07/2023 2240   PHURINE 6.0 04/07/2023 2240   GLUCOSEU NEGATIVE 04/07/2023 2240   HGBUR NEGATIVE 04/07/2023 2240   BILIRUBINUR NEGATIVE 04/07/2023 2240   KETONESUR NEGATIVE 04/07/2023 2240   PROTEINUR NEGATIVE 04/07/2023 2240   UROBILINOGEN 4.0 (H) 11/05/2010 1843   NITRITE NEGATIVE 04/07/2023 2240   LEUKOCYTESUR NEGATIVE 04/07/2023 2240   Sepsis Labs Recent Labs  Lab 04/08/23 0744 04/09/23 0603 04/10/23 0905 04/11/23 0822  WBC 1.7* 1.3* 1.7* 2.4*   Microbiology Recent Results (from the past 240 hour(s))  Culture, blood (Routine x 2)     Status: None (Preliminary result)   Collection Time: 04/07/23  6:25 PM   Specimen: BLOOD  Result Value Ref Range Status   Specimen Description BLOOD LEFT ANTECUBITAL  Final   Special Requests   Final    BOTTLES DRAWN AEROBIC AND ANAEROBIC Blood Culture adequate volume   Culture   Final    NO GROWTH 4 DAYS Performed at [facility], [address], [place], [place] [address]    Report Status PENDING  Incomplete  Resp panel by RT-PCR (RSV, Flu A&B, Covid) Anterior Nasal Swab     Status: None   Collection Time: 04/07/23  6:26 PM   Specimen: Anterior Nasal Swab  Result Value Ref Range Status   SARS Coronavirus 2 by RT PCR NEGATIVE NEGATIVE Final  Influenza A by PCR NEGATIVE NEGATIVE Final   Influenza B by PCR NEGATIVE NEGATIVE Final    Comment: (NOTE) The Xpert Xpress SARS-CoV-2/FLU/RSV plus assay is intended as an aid in the diagnosis of influenza from Nasopharyngeal swab specimens and should not be used as a sole basis for treatment. Nasal washings and aspirates are unacceptable for Xpert Xpress SARS-CoV-2/FLU/RSV testing.  Fact Sheet for Patients: BloggerCourse.com  Fact Sheet for Healthcare Providers: SeriousBroker.it  This test is not yet approved or cleared by the [place] FDA  and has been authorized for detection and/or diagnosis of SARS-CoV-2 by FDA under an Emergency Use Authorization (EUA). This EUA will remain in effect (meaning this test can be used) for the duration of the COVID-19 declaration under Section 564(b)(1) of the Act, 21 U.S.C. section 360bbb-3(b)(1), unless the authorization is terminated or revoked.     Resp Syncytial Virus by PCR NEGATIVE NEGATIVE Final    Comment: (NOTE) Fact Sheet for Patients: BloggerCourse.com  Fact Sheet for Healthcare Providers: SeriousBroker.it  This test is not yet approved or cleared by the [place] FDA and has been authorized for detection and/or diagnosis of SARS-CoV-2 by FDA under an Emergency Use Authorization (EUA). This EUA will remain in effect (meaning this test can be used) for the duration of the COVID-19 declaration under Section 564(b)(1) of the Act, 21 U.S.C. section 360bbb-3(b)(1), unless the authorization is terminated or revoked.  Performed at [facility], [address], [place], [place] [address]   Culture, blood (Routine x 2)     Status: None (Preliminary result)   Collection Time: 04/07/23  6:30 PM   Specimen: BLOOD RIGHT HAND  Result Value Ref Range Status   Specimen Description BLOOD RIGHT HAND  Final   Special Requests   Final    BOTTLES DRAWN AEROBIC AND ANAEROBIC Blood Culture adequate volume   Culture   Final    NO GROWTH 4 DAYS Performed at [facility], [address], [place], [place] [address]    Report Status PENDING  Incomplete  Respiratory (~20 pathogens) panel by PCR     Status: None   Collection Time: 04/08/23  4:42 PM   Specimen: Nasopharyngeal Swab; Respiratory  Result Value Ref Range Status   Adenovirus NOT DETECTED NOT DETECTED Final   Coronavirus 229E NOT DETECTED NOT DETECTED Final    Comment: (NOTE) The Coronavirus on the Respiratory Panel, DOES NOT test for the novel  Coronavirus (2019  nCoV)    Coronavirus HKU1 NOT DETECTED NOT DETECTED Final   Coronavirus NL63 NOT DETECTED NOT DETECTED Final   Coronavirus OC43 NOT DETECTED NOT DETECTED Final   Metapneumovirus NOT DETECTED NOT DETECTED Final   Rhinovirus / Enterovirus NOT DETECTED NOT DETECTED Final   Influenza A NOT DETECTED NOT DETECTED Final   Influenza B NOT DETECTED NOT DETECTED Final   Parainfluenza Virus 1 NOT DETECTED NOT DETECTED Final   Parainfluenza Virus 2 NOT DETECTED NOT DETECTED Final   Parainfluenza Virus 3 NOT DETECTED NOT DETECTED Final   Parainfluenza Virus 4 NOT DETECTED NOT DETECTED Final   Respiratory Syncytial Virus NOT DETECTED NOT DETECTED Final   Bordetella pertussis NOT DETECTED NOT DETECTED Final   Bordetella Parapertussis NOT DETECTED NOT DETECTED Final   Chlamydophila pneumoniae NOT DETECTED NOT DETECTED Final   Mycoplasma pneumoniae NOT DETECTED NOT DETECTED Final    Comment: Performed at [facility], [address] [place], [place] [address]    FURTHER DISCHARGE INSTRUCTIONS:  Get Medicines reviewed and adjusted: Please take all your medications with you for your next visit with your Primary MD   Laboratory/radiological data: Please request your Primary MD to go over all hospital tests and procedure/radiological results at the follow up, please ask your Primary MD to get all Hospital records sent to his/her office.   In some cases, they will be blood work, cultures and biopsy results pending at the time of your discharge. Please request that your primary care M.D. goes through all the records of your hospital data and follows up on these results.   Also Note the following: If you experience worsening of your admission symptoms, develop shortness of breath, life threatening emergency, suicidal or homicidal thoughts you must seek medical attention immediately by calling 911 or calling your MD immediately  if symptoms less severe.   You must read complete  instructions/literature along with all the possible adverse reactions/side effects for all the Medicines you take and that have been prescribed to you. Take any new Medicines after you have completely understood and accpet all the possible adverse reactions/side effects.    Do not drive when taking Pain medications or sleeping medications (Benzodaizepines)   Do not take more than prescribed Pain, Sleep and Anxiety Medications. It is not advisable to combine anxiety,sleep and pain medications without talking with your primary care practitioner   Special Instructions: If you have smoked or chewed Tobacco  in the last 2 yrs please stop smoking, stop any regular Alcohol  and or any Recreational drug use.   Wear Seat belts while driving.   Please note: You were cared for by a hospitalist during your hospital stay. Once you are discharged, your primary care physician will handle any further medical issues. Please note that NO REFILLS for any discharge medications will be authorized once you are discharged, as it is imperative that you return to your primary care physician (or establish a relationship with a primary care physician if you do not have one) for your post hospital discharge needs so that they can reassess your need for medications and monitor your lab values  Time coordinating discharge: Over 30 minutes  SIGNED:   Hughie Closs, MD  [facility] 04/11/2023, 10:19 AM *Please note that this is a verbal dictation therefore any spelling or grammatical errors are due to the "Dragon Medical One" system interpretation. If 7PM-7AM, please contact night-coverage [email or web address]

## 2023-04-11 NOTE — Plan of Care (Signed)
  Problem: Clinical Measurements: Goal: Diagnostic test results will improve Outcome: Progressing   Problem: Pain Managment: Goal: General experience of comfort will improve Outcome: Progressing   Problem: Skin Integrity: Goal: Risk for impaired skin integrity will decrease Outcome: Progressing   

## 2023-04-12 LAB — HUMAN PARVOVIRUS DNA DETECTION BY PCR: Parvovirus B19, PCR: NEGATIVE

## 2023-04-12 LAB — CULTURE, BLOOD (ROUTINE X 2)
Culture: NO GROWTH
Culture: NO GROWTH
Special Requests: ADEQUATE
Special Requests: ADEQUATE

## 2023-04-22 ENCOUNTER — Other Ambulatory Visit (HOSPITAL_COMMUNITY): Payer: Self-pay

## 2023-04-30 ENCOUNTER — Telehealth: Payer: Self-pay

## 2023-04-30 DIAGNOSIS — B20 Human immunodeficiency virus [HIV] disease: Secondary | ICD-10-CM

## 2023-04-30 MED ORDER — BIKTARVY 50-200-25 MG PO TABS: 1.0000 | ORAL_TABLET | Freq: Every day | ORAL | 1 refills | Status: DC

## 2023-04-30 NOTE — Telephone Encounter (Signed)
Jason Herman with [facility] is going to be working with Jason Herman to try and assist with engagement in care.   Provided her contact info to Jason Herman. Referral also placed to [facility] to see if their bridge counselor can assist.   Biktarvy refill sent, Victorino Dike will pick up from pharmacy and bring to JJ.   Sandie Ano, RN

## 2023-05-06 ENCOUNTER — Telehealth: Payer: Self-pay

## 2023-05-06 ENCOUNTER — Other Ambulatory Visit (HOSPITAL_COMMUNITY): Payer: Self-pay

## 2023-05-06 NOTE — Telephone Encounter (Signed)
[facility] Patient Advocate Encounter  Insurance verification completed.    The patient is insured through [employer]. Patient has [employer], may use a copay card, and/or apply for patient assistance if available.    Ran test claim for BIKTARVY,SYMTUZA,DOVATO  The current 30 day co-pay is $0.  We will continue to follow to see if copay assistance is needed.  This test claim was processed through [facility]- copay amounts may vary at other pharmacies due to pharmacy/plan contracts, or as the patient moves through the different stages of their insurance plan.

## 2023-05-08 ENCOUNTER — Ambulatory Visit: Payer: Managed Care, Other (non HMO) | Admitting: Internal Medicine

## 2023-08-21 IMAGING — DX DG CHEST 1V PORT
1 series · 1 of 1 positions shown · non-contrast
Comparison: CT chest and chest radiograph October 23, 2021.

CLINICAL DATA: Questionable sepsis - evaluate for abnormality

EXAM:
PORTABLE CHEST 1 VIEW

[chest ap]
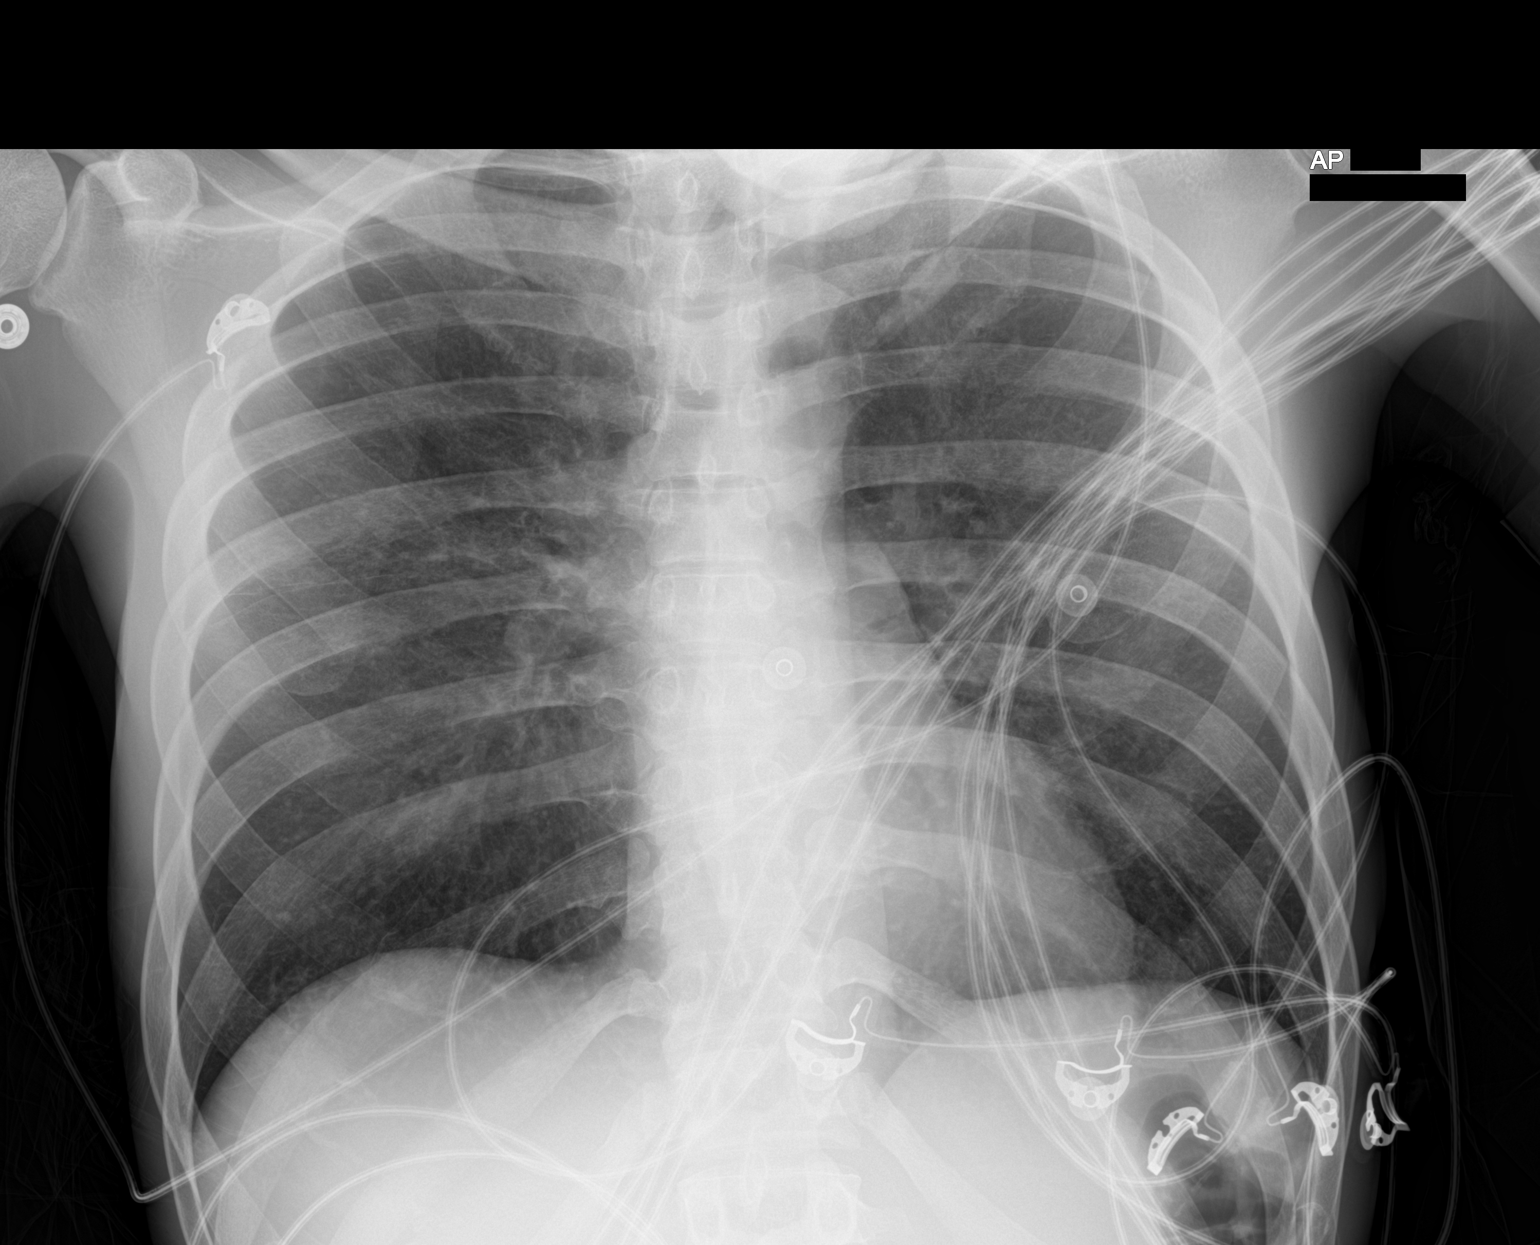

[1 of 1 positions shown; findings below may reference images not displayed]

FINDINGS: Mild diffuse interstitial and ill-defined opacities. No confluent
consolidation. No visible pleural effusions or pneumothorax.
Cardiomediastinal silhouette is within normal limits. No displaced
fracture.
IMPRESSION: Mild diffuse interstitial and ill-defined opacities, potentially
related to findings seen on recent CT chest. No confluent
consolidation.

## 2023-08-21 IMAGING — CT CT HEAD W/O CM
3 series · 14 of 47 positions shown, 16 images · non-contrast
Comparison: 10/21/2021

CLINICAL DATA: Delirium



[Series 2: head wo · axial · 0.47mm/px · z∈[+1721,+1851]mm · 8 of 32 slices shown, 10 images]
[im 3/32  brain]
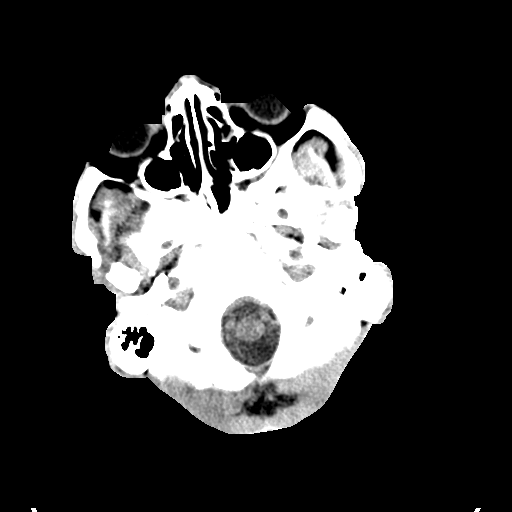
[im 3/32  bone]
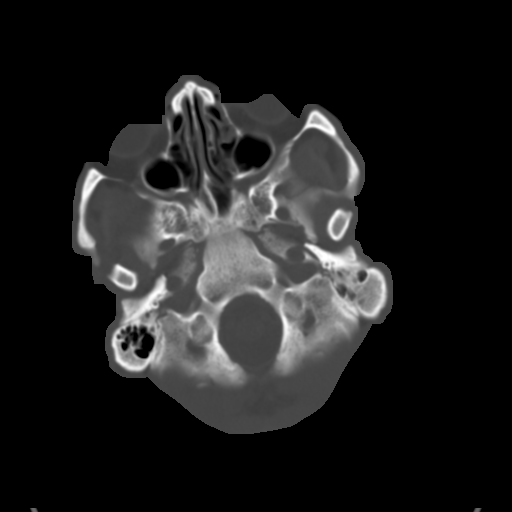
[im 7/32  brain]
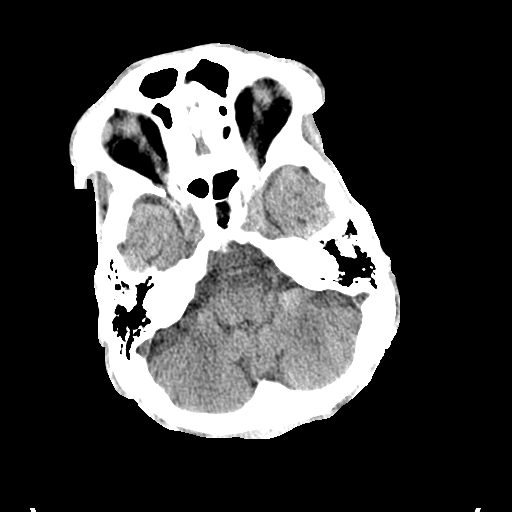
[im 10/32  brain]
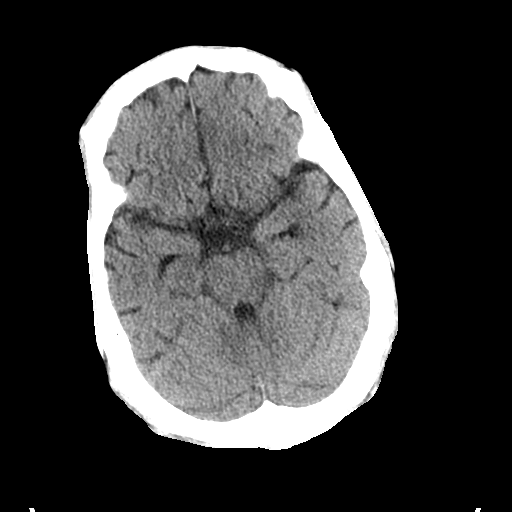
[im 14/32  brain]
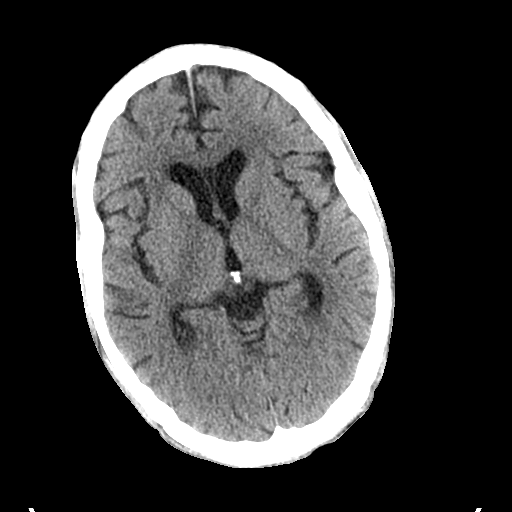
[im 18/32  brain]
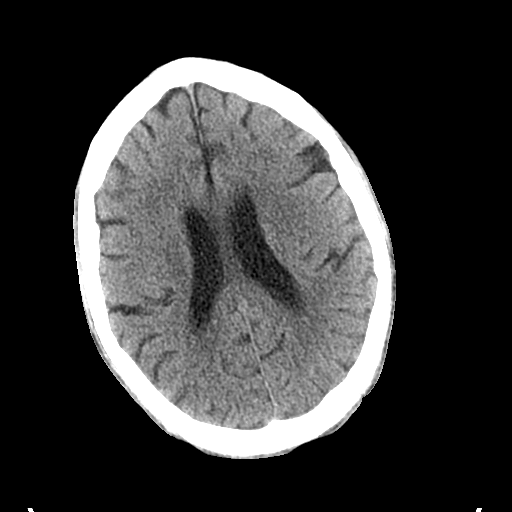
[im 18/32  bone]
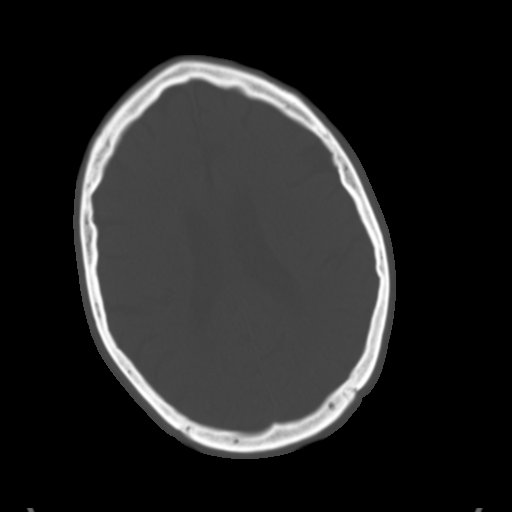
[im 22/32  brain]
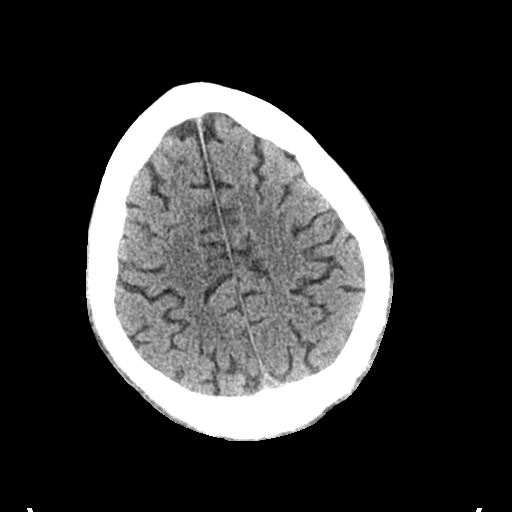
[im 25/32  brain]
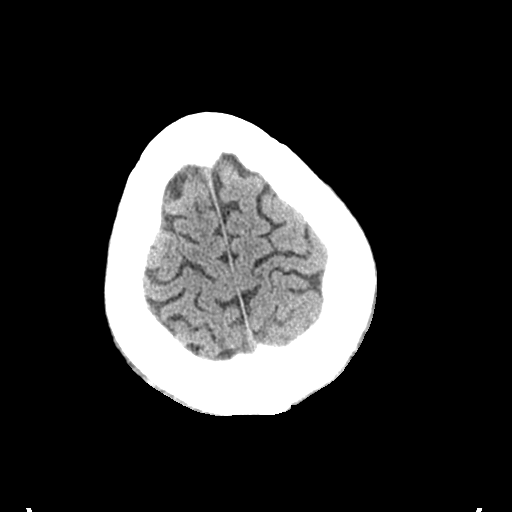
[im 29/32  brain]
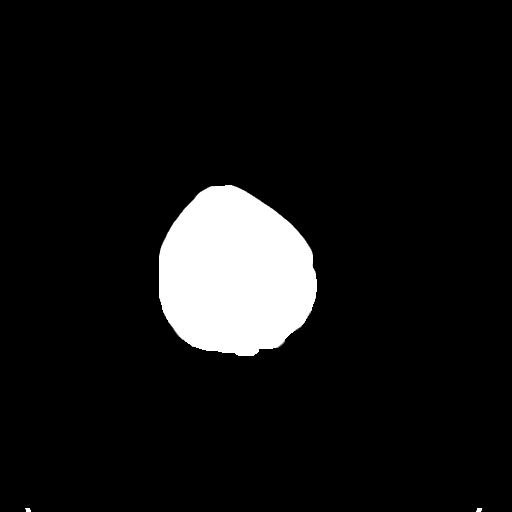

[Series 5: coronal soft tissue · coronal · 0.31mm/px · 3 of 84 slices shown]
[im 28/84  brain]
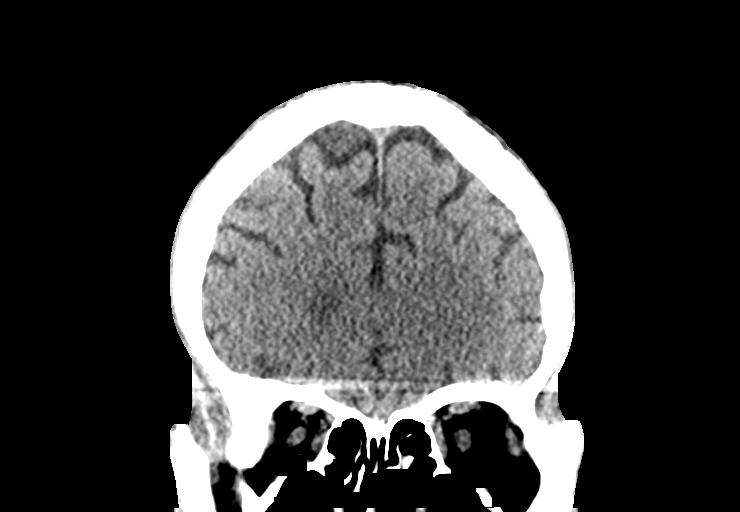
[im 37/84  brain]
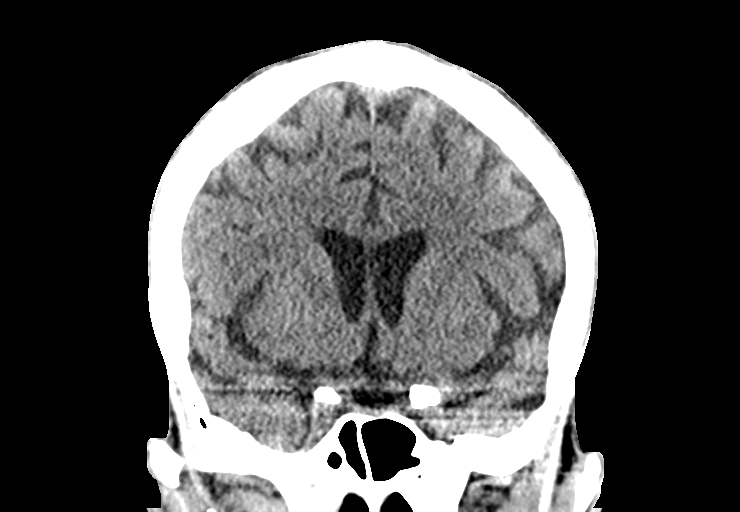
[im 47/84  brain]
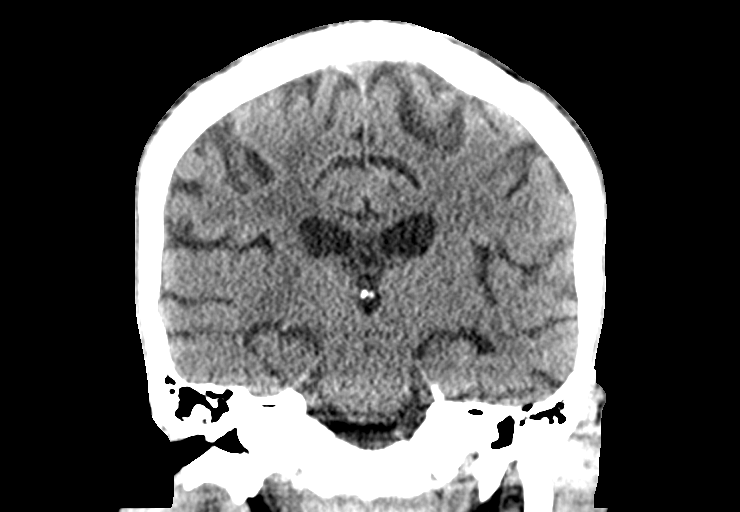

[Series 6: sagittal soft tissue · sagittal · 0.31mm/px · 3 of 75 slices shown]
[im 25/75  brain]
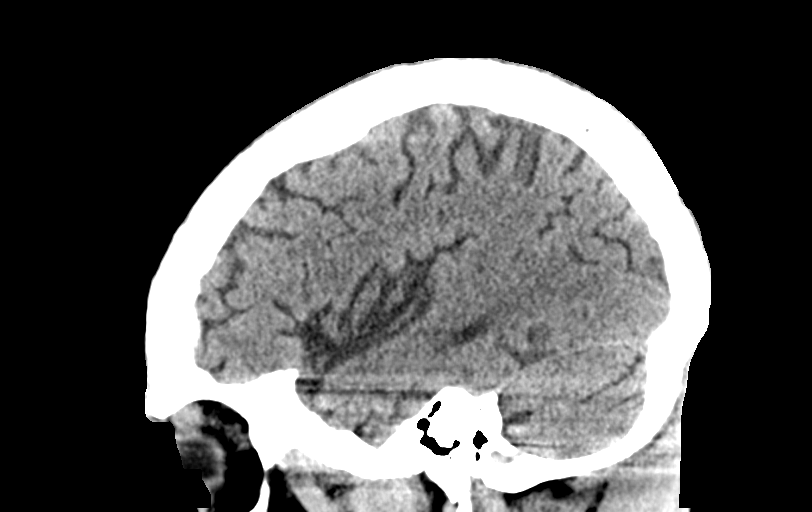
[im 38/75  brain]
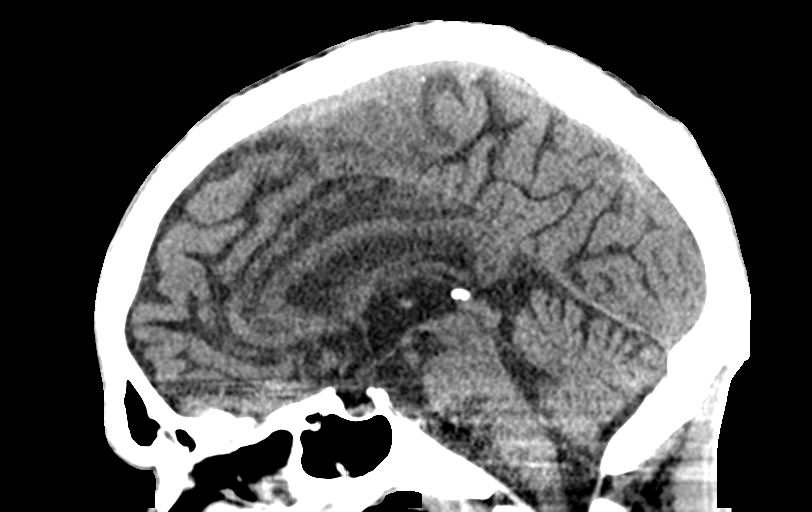
[im 50/75  brain]
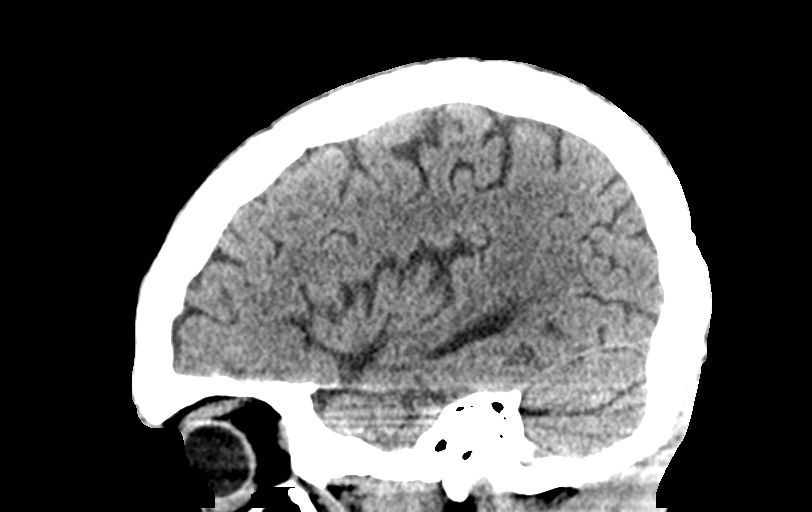

[14 of 47 positions shown; findings below may reference images not displayed]

FINDINGS: Brain: There is mild diffuse parenchymal volume loss which is
advanced in relation to the patient's stated age. No abnormal intra
or extra-axial mass lesion or fluid collection. No abnormal mass
effect or midline shift. No evidence of acute intracranial
hemorrhage or infarct. Ventricular size is normal. Cerebellum
unremarkable.

Vascular: Unremarkable

Skull: Intact

Sinuses/Orbits: Mild diffuse mucosal thickening within the
visualized paranasal sinuses with small air-fluid level partially
visualized within the right maxillary sinus. Orbits are
unremarkable.

Other: Mastoid air cells and middle ear cavities are clear.
IMPRESSION: No acute intracranial abnormality.

Diffuse parenchymal atrophy, advanced in relation of the patient's
stated age.

Mild diffuse paranasal sinus disease

## 2023-08-29 IMAGING — DX DG CHEST 1V PORT
1 series · 1 of 1 positions shown · non-contrast
Comparison: One-view chest x-ray 11/02/2021

CLINICAL DATA: Pneumonia.  HIV.

EXAM:
PORTABLE CHEST 1 VIEW

[chest ap]
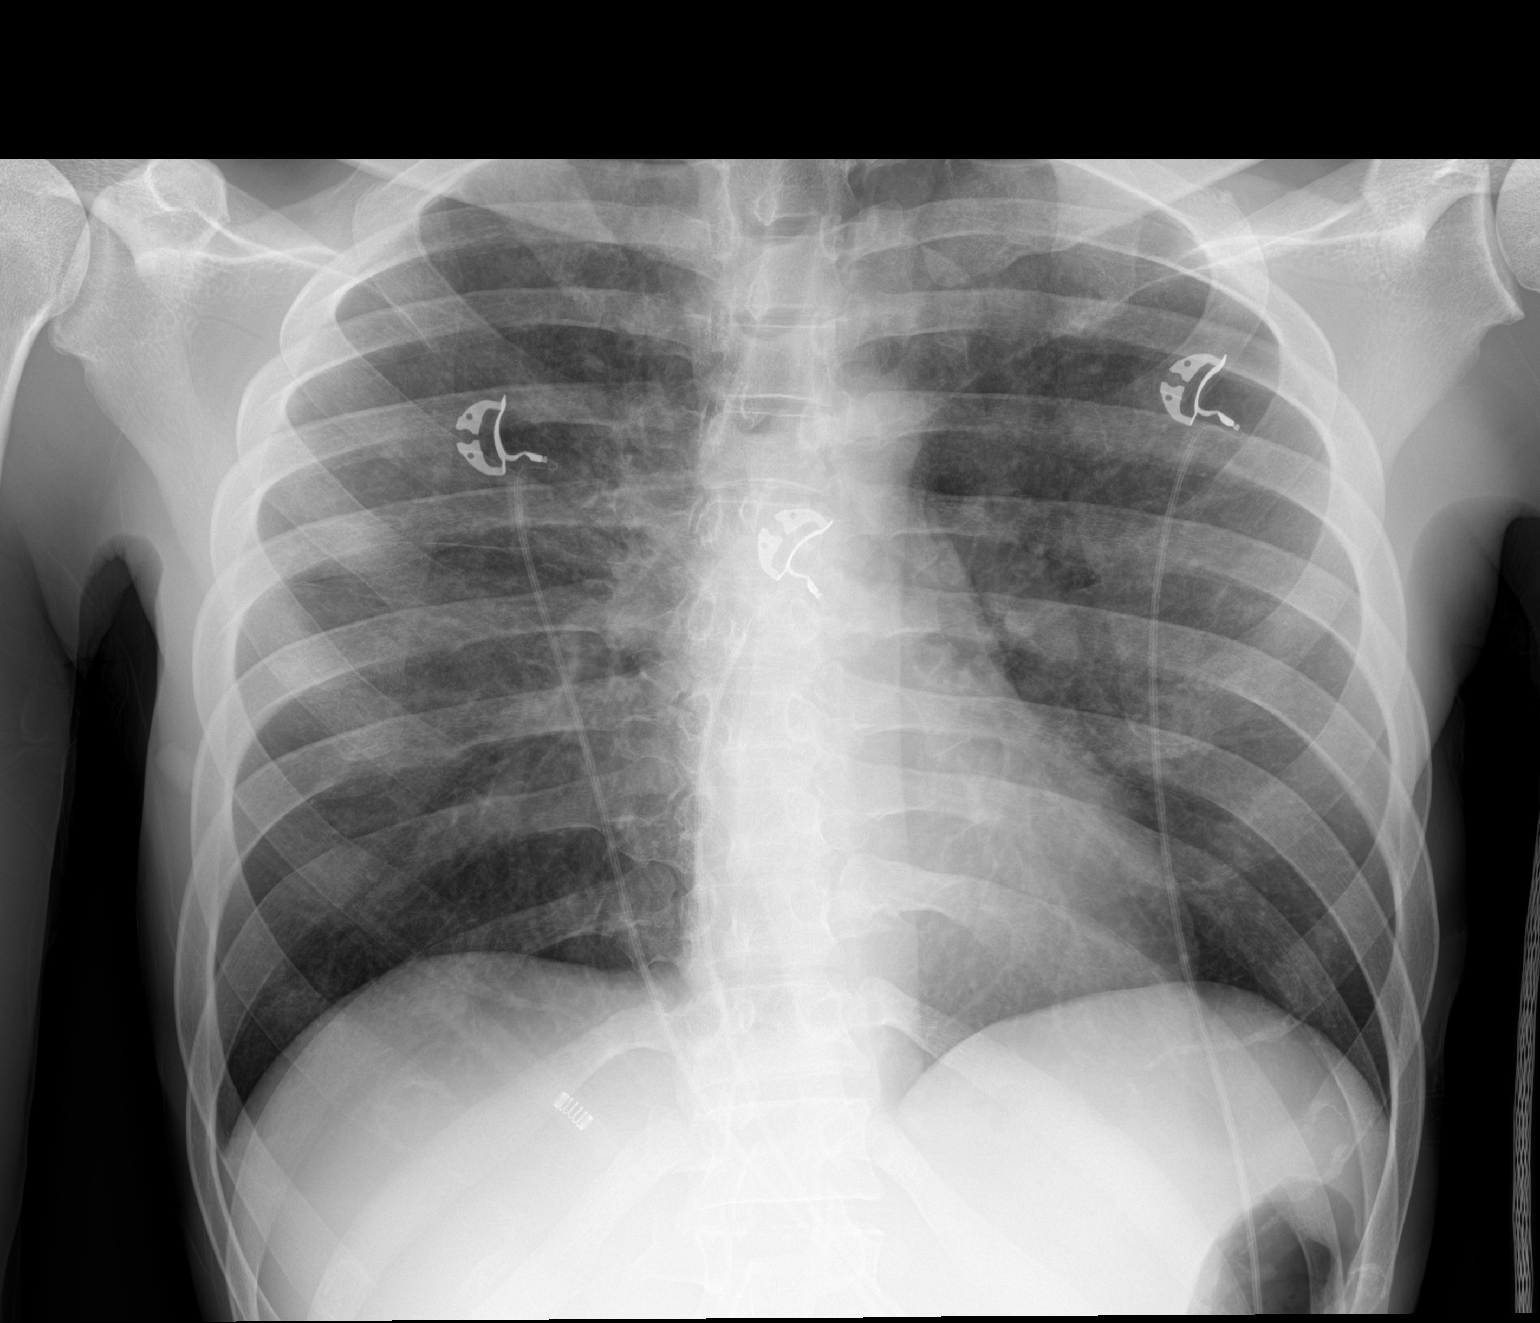

[1 of 1 positions shown; findings below may reference images not displayed]

FINDINGS: Heart size is normal. Upper lobe airspace opacities are present
scratched at subtle upper lobe airspace disease are present
bilaterally. Lung bases are clear.
IMPRESSION: Subtle bilateral upper lobe airspace disease compatible with
pneumonia. This may represent atypical infection. Edema is
considered less likely.

## 2023-10-23 ENCOUNTER — Telehealth: Payer: Self-pay

## 2023-10-23 NOTE — Telephone Encounter (Signed)
 Patient last seen 04/04/22. Per custody records he is currently at [facility] jail. Spoke with medical and got him scheduled for follow up 6/6. They were unaware of his status and report he has not been on ART.   Medication last dispensed 04/2023, patient was incarcerated 05/2023.  Shady Bradish, BSN, RN

## 2023-11-22 ENCOUNTER — Other Ambulatory Visit: Payer: Self-pay

## 2023-11-22 ENCOUNTER — Encounter: Payer: Self-pay | Admitting: Infectious Diseases

## 2023-11-22 ENCOUNTER — Ambulatory Visit: Admitting: Infectious Diseases

## 2023-11-22 VITALS — BP 125/82 | HR 192 | Resp 16 | Ht 71.0 in | Wt 190.0 lb

## 2023-11-22 DIAGNOSIS — Z113 Encounter for screening for infections with a predominantly sexual mode of transmission: Secondary | ICD-10-CM

## 2023-11-22 DIAGNOSIS — B20 Human immunodeficiency virus [HIV] disease: Secondary | ICD-10-CM | POA: Diagnosis not present

## 2023-11-22 DIAGNOSIS — Z0000 Encounter for general adult medical examination without abnormal findings: Secondary | ICD-10-CM | POA: Diagnosis not present

## 2023-11-22 DIAGNOSIS — Z79899 Other long term (current) drug therapy: Secondary | ICD-10-CM | POA: Diagnosis not present

## 2023-11-22 NOTE — Patient Instructions (Signed)
 Smoking Cessation: QuitlineNC 1-800-QUIT-NOW 707-701-6721); Espaol: 1-855-Djelo-Ya ([phone]) [email or web address]
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
## 2023-11-23 LAB — C. TRACHOMATIS/N. GONORRHOEAE RNA
C. trachomatis RNA, TMA: NOT DETECTED
N. gonorrhoeae RNA, TMA: NOT DETECTED

## 2023-11-26 ENCOUNTER — Ambulatory Visit: Payer: Self-pay | Admitting: Infectious Diseases

## 2023-11-26 DIAGNOSIS — B20 Human immunodeficiency virus [HIV] disease: Secondary | ICD-10-CM

## 2023-11-26 LAB — CBC
HCT: 37.5 % — ABNORMAL LOW (ref 38.5–50.0)
Hemoglobin: 12.6 g/dL — ABNORMAL LOW (ref 13.2–17.1)
MCH: 32.2 pg (ref 27.0–33.0)
MCHC: 33.6 g/dL (ref 32.0–36.0)
MCV: 95.9 fL (ref 80.0–100.0)
MPV: 9.2 fL (ref 7.5–12.5)
Platelets: 262 10*3/uL (ref 140–400)
RBC: 3.91 10*6/uL — ABNORMAL LOW (ref 4.20–5.80)
RDW: 13.7 % (ref 11.0–15.0)
WBC: 6 10*3/uL (ref 3.8–10.8)

## 2023-11-26 LAB — COMPREHENSIVE METABOLIC PANEL WITH GFR
AG Ratio: 1.1 (calc) (ref 1.0–2.5)
ALT: 12 U/L (ref 9–46)
AST: 14 U/L (ref 10–40)
Albumin: 4.6 g/dL (ref 3.6–5.1)
Alkaline phosphatase (APISO): 62 U/L (ref 36–130)
BUN: 7 mg/dL (ref 7–25)
CO2: 28 mmol/L (ref 20–32)
Calcium: 9.7 mg/dL (ref 8.6–10.3)
Chloride: 106 mmol/L (ref 98–110)
Creat: 1.14 mg/dL (ref 0.60–1.26)
Globulin: 4.2 g/dL — ABNORMAL HIGH (ref 1.9–3.7)
Glucose, Bld: 71 mg/dL (ref 65–99)
Potassium: 3.5 mmol/L (ref 3.5–5.3)
Sodium: 141 mmol/L (ref 135–146)
Total Bilirubin: 0.5 mg/dL (ref 0.2–1.2)
Total Protein: 8.8 g/dL — ABNORMAL HIGH (ref 6.1–8.1)
eGFR: 84 mL/min/{1.73_m2} (ref >=60)

## 2023-11-26 LAB — T-HELPER CELLS (CD4) COUNT (NOT AT ARMC)
Absolute CD4: 404 {cells}/uL — ABNORMAL LOW (ref 490–1740)
CD4 T Helper %: 20 % — ABNORMAL LOW (ref 30–61)
Total lymphocyte count: 2031 {cells}/uL (ref 850–3900)

## 2023-11-26 LAB — HIV RNA, RTPCR W/R GT (RTI, PI,INT)
HIV 1 RNA Quant: 204 {copies}/mL — ABNORMAL HIGH
HIV-1 RNA Quant, Log: 2.31 {Log_copies}/mL — ABNORMAL HIGH

## 2023-11-26 LAB — LIPID PANEL
Cholesterol: 157 mg/dL (ref <200)
HDL: 49 mg/dL (ref >=40)
LDL Cholesterol (Calc): 82 mg/dL
Non-HDL Cholesterol (Calc): 108 mg/dL (ref <130)
Total CHOL/HDL Ratio: 3.2 (calc) (ref <5.0)
Triglycerides: 160 mg/dL — ABNORMAL HIGH (ref <150)

## 2023-11-26 LAB — RPR: RPR Ser Ql: NONREACTIVE

## 2023-11-26 MED ORDER — BIKTARVY 50-200-25 MG PO TABS: 1.0000 | ORAL_TABLET | Freq: Every day | ORAL | 1 refills | Status: DC

## 2023-11-26 NOTE — Addendum Note (Signed)
 Addended by: Arlon Bergamo D on: 11/26/2023 01:13 PM   Modules accepted: Orders

## 2023-11-26 NOTE — Telephone Encounter (Signed)
 Jail called back, scheduled for 6 week follow up. Biktarvy  refills sent to [facility] in [place].   Arlon Bergamo, BSN, Charity fundraiser
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
## 2024-01-09 ENCOUNTER — Encounter: Payer: Self-pay | Admitting: Infectious Diseases

## 2024-01-09 ENCOUNTER — Other Ambulatory Visit: Payer: Self-pay

## 2024-01-09 ENCOUNTER — Ambulatory Visit: Admitting: Infectious Diseases

## 2024-01-09 ENCOUNTER — Other Ambulatory Visit (HOSPITAL_COMMUNITY)
Admission: RE | Admit: 2024-01-09 | Discharge: 2024-01-09 | Disposition: A | Discharge status: Home / Self Care | Source: Ambulatory Visit | Attending: Infectious Diseases | Admitting: Infectious Diseases

## 2024-01-09 VITALS — BP 133/86 | HR 78 | Temp 97.8°F

## 2024-01-09 DIAGNOSIS — F209 Schizophrenia, unspecified: Secondary | ICD-10-CM | POA: Diagnosis not present

## 2024-01-09 DIAGNOSIS — Z113 Encounter for screening for infections with a predominantly sexual mode of transmission: Secondary | ICD-10-CM | POA: Insufficient documentation

## 2024-01-09 DIAGNOSIS — B20 Human immunodeficiency virus [HIV] disease: Secondary | ICD-10-CM

## 2024-01-09 DIAGNOSIS — Z79899 Other long term (current) drug therapy: Secondary | ICD-10-CM | POA: Diagnosis not present

## 2024-01-09 DIAGNOSIS — Z0000 Encounter for general adult medical examination without abnormal findings: Secondary | ICD-10-CM
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
## 2024-01-09 NOTE — Addendum Note (Signed)
 Addended by: GRETEL TULLY HERO on: 01/09/2024 09:25 AM   Modules accepted: Orders

## 2024-01-10 LAB — URINE CYTOLOGY ANCILLARY ONLY
Chlamydia: NEGATIVE
Comment: NEGATIVE
Comment: NORMAL
Neisseria Gonorrhea: NEGATIVE

## 2024-01-10 LAB — CYTOLOGY, (ORAL, ANAL, URETHRAL) ANCILLARY ONLY
Chlamydia: NEGATIVE
Comment: NEGATIVE
Comment: NORMAL
Neisseria Gonorrhea: NEGATIVE

## 2024-01-10 LAB — T-HELPER CELLS (CD4) COUNT (NOT AT ARMC)
CD4 % Helper T Cell: 17 % — ABNORMAL LOW (ref 33–65)
CD4 T Cell Abs: 356 /uL — ABNORMAL LOW (ref 400–1790)

## 2024-01-14 LAB — RPR: RPR Ser Ql: NONREACTIVE

## 2024-01-14 LAB — HIV RNA, RTPCR W/R GT (RTI, PI,INT)
HIV 1 RNA Quant: 37 {copies}/mL — ABNORMAL HIGH
HIV-1 RNA Quant, Log: 1.57 {Log_copies}/mL — ABNORMAL HIGH

## 2024-02-03 ENCOUNTER — Other Ambulatory Visit: Payer: Self-pay

## 2024-02-03 DIAGNOSIS — B20 Human immunodeficiency virus [HIV] disease: Secondary | ICD-10-CM

## 2024-02-03 MED ORDER — BIKTARVY 50-200-25 MG PO TABS: 1.0000 | ORAL_TABLET | Freq: Every day | ORAL | 1 refills | Status: AC

## 2024-03-09 ENCOUNTER — Telehealth: Payer: Self-pay | Admitting: Infectious Diseases
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
## 2024-03-10 ENCOUNTER — Ambulatory Visit: Admitting: Infectious Diseases
